# Patient Record
Sex: Female | Born: 1980 | Hispanic: Yes | Marital: Single | Smoking: Never smoker
Health system: Southern US, Community
[De-identification: ages and names within clinical notes are randomized; demographics above are authoritative.]

## PROBLEM LIST (undated history)

## (undated) ENCOUNTER — Inpatient Hospital Stay (HOSPITAL_COMMUNITY): Payer: Self-pay

## (undated) DIAGNOSIS — I10 Essential (primary) hypertension: Secondary | ICD-10-CM

## (undated) DIAGNOSIS — E669 Obesity, unspecified: Secondary | ICD-10-CM

## (undated) DIAGNOSIS — K760 Fatty (change of) liver, not elsewhere classified: Secondary | ICD-10-CM

## (undated) DIAGNOSIS — E119 Type 2 diabetes mellitus without complications: Secondary | ICD-10-CM

## (undated) DIAGNOSIS — R7989 Other specified abnormal findings of blood chemistry: Secondary | ICD-10-CM

## (undated) HISTORY — DX: Type 2 diabetes mellitus without complications: E11.9

## (undated) HISTORY — PX: THERAPEUTIC ABORTION: SHX798

## (undated) HISTORY — DX: Fatty (change of) liver, not elsewhere classified: K76.0

## (undated) HISTORY — DX: Other specified abnormal findings of blood chemistry: R79.89

## (undated) HISTORY — DX: Obesity, unspecified: E66.9

---

## 2012-05-17 ENCOUNTER — Emergency Department (HOSPITAL_COMMUNITY): Payer: Self-pay

## 2012-05-17 ENCOUNTER — Encounter (HOSPITAL_COMMUNITY): Payer: Self-pay | Admitting: *Deleted

## 2012-05-17 ENCOUNTER — Emergency Department (HOSPITAL_COMMUNITY)
Admission: EM | Admit: 2012-05-17 | Discharge: 2012-05-17 | Disposition: A | Discharge status: Home / Self Care | Payer: Self-pay | Attending: Emergency Medicine | Admitting: Emergency Medicine

## 2012-05-17 DIAGNOSIS — R110 Nausea: Secondary | ICD-10-CM | POA: Insufficient documentation

## 2012-05-17 DIAGNOSIS — R109 Unspecified abdominal pain: Secondary | ICD-10-CM | POA: Insufficient documentation

## 2012-05-17 DIAGNOSIS — Z3202 Encounter for pregnancy test, result negative: Secondary | ICD-10-CM | POA: Insufficient documentation

## 2012-05-17 DIAGNOSIS — I10 Essential (primary) hypertension: Secondary | ICD-10-CM | POA: Insufficient documentation

## 2012-05-17 HISTORY — DX: Essential (primary) hypertension: I10

## 2012-05-17 LAB — CBC WITH DIFFERENTIAL/PLATELET
Basophils Absolute: 0 10*3/uL (ref 0.0–0.1)
Basophils Relative: 0 % (ref 0–1)
Hemoglobin: 13.3 g/dL (ref 12.0–15.0)
MCHC: 33.9 g/dL (ref 30.0–36.0)
Monocytes Relative: 6 % (ref 3–12)
Neutro Abs: 4.8 10*3/uL (ref 1.7–7.7)
Neutrophils Relative %: 47 % (ref 43–77)
RDW: 12.8 % (ref 11.5–15.5)

## 2012-05-17 LAB — COMPREHENSIVE METABOLIC PANEL
ALT: 54 U/L — ABNORMAL HIGH (ref 0–35)
AST: 35 U/L (ref 0–37)
Albumin: 4.1 g/dL (ref 3.5–5.2)
Alkaline Phosphatase: 118 U/L — ABNORMAL HIGH (ref 39–117)
Chloride: 101 mEq/L (ref 96–112)
Potassium: 3.8 mEq/L (ref 3.5–5.1)
Total Bilirubin: 0.2 mg/dL — ABNORMAL LOW (ref 0.3–1.2)

## 2012-05-17 LAB — URINALYSIS, MICROSCOPIC ONLY
Glucose, UA: NEGATIVE mg/dL
Hgb urine dipstick: NEGATIVE
Ketones, ur: NEGATIVE mg/dL
Protein, ur: NEGATIVE mg/dL

## 2012-05-17 LAB — GC/CHLAMYDIA PROBE AMP
CT Probe RNA: NEGATIVE
GC Probe RNA: NEGATIVE

## 2012-05-17 LAB — WET PREP, GENITAL

## 2012-05-17 MED ORDER — NAPROXEN 500 MG PO TABS: 500.0000 mg | ORAL_TABLET | Freq: Two times a day (BID) | ORAL | Status: DC

## 2012-05-17 MED ORDER — NAPROXEN 250 MG PO TABS
500.0000 mg | ORAL_TABLET | Freq: Once | ORAL | Status: AC
  Administered 2012-05-17: 500 mg via ORAL
  Filled 2012-05-17: qty 2

## 2012-05-17 NOTE — ED Notes (Signed)
Per interpreter phone: pt c/o abdominal pain, has not has been able to eat.  Abcess on abdomen.  States she has been vomiting, nausea, diarrhea.  Denies urinary sx, urinary pain.

## 2012-05-17 NOTE — ED Notes (Signed)
Pt c/o abdominal pain x 3 days, vomiting/nausea.  Pt also states she has an abscess on her abdomen.

## 2012-05-17 NOTE — ED Notes (Signed)
Pt discharged.Vital signs stable and GCS 15 

## 2012-05-17 NOTE — ED Provider Notes (Signed)
History     CSN: [id number]  Arrival date & time 05/17/12  0126   First MD Initiated Contact with Patient 05/17/12 [phone]      Chief Complaint  Patient presents with  . Abdominal Pain    (Consider location/radiation/quality/duration/timing/severity/associated sxs/prior treatment) HPI Comments: The patient states that she has had lower abdominal pain to the right lower quadrant and the left lower quadrant for the last 4 days, it seems to be worse when she is, it is very mild at this time, nothing seems to make it better. She denies any diarrhea, constipation, blood in her stools, dysuria or vaginal discharge. She stopped her menstrual period approximately 5 days ago. She has had a prior cesarean section 8 years ago but no other abdominal surgery.    Patient is a 32 y.o. female presenting with abdominal pain. The history is provided by the patient and a friend.  Abdominal Pain The primary symptoms of the illness include abdominal pain and nausea. The primary symptoms of the illness do not include fever, fatigue, shortness of breath, vomiting, diarrhea, hematemesis, hematochezia, dysuria, vaginal discharge or vaginal bleeding. Episode onset: Four days ago. The onset of the illness was gradual. Progression since onset: It has not changed, it comes and goes, seems to be worse when she eats.    Past Medical History  Diagnosis Date  . Hypertension     Past Surgical History  Procedure Date  . Cesarean section     History reviewed. No pertinent family history.  History  Substance Use Topics  . Smoking status: Never Smoker   . Smokeless tobacco: Not on file  . Alcohol Use: Yes    OB History    Grav Para Term Preterm Abortions TAB SAB Ect Mult Living                  Review of Systems  Constitutional: Negative for fever and fatigue.  Respiratory: Negative for shortness of breath.   Gastrointestinal: Positive for nausea and abdominal pain. Negative for vomiting, diarrhea,  hematochezia and hematemesis.  Genitourinary: Negative for dysuria, vaginal bleeding and vaginal discharge.  All other systems reviewed and are negative.    Allergies  Review of patient's allergies indicates no known allergies.  Home Medications   Current Outpatient Rx  Name  Route  Sig  Dispense  Refill  . ACETAMINOPHEN 500 MG PO TABS   Oral   Take 1,000 mg by mouth every 6 (six) hours as needed. For pain         . NAPROXEN 500 MG PO TABS   Oral   Take 1 tablet (500 mg total) by mouth 2 (two) times daily with a meal.   30 tablet   0     BP 116/67  Pulse 79  Temp 97.5 F (36.4 C) (Oral)  Resp 16  SpO2 99%  LMP 05/09/2012  Physical Exam  Nursing note and vitals reviewed. Constitutional: She appears well-developed and well-nourished. No distress.  HENT:  Head: Normocephalic and atraumatic.  Mouth/Throat: Oropharynx is clear and moist. No oropharyngeal exudate.  Eyes: Conjunctivae normal and EOM are normal. Pupils are equal, round, and reactive to light. Right eye exhibits no discharge. Left eye exhibits no discharge. No scleral icterus.  Neck: Normal range of motion. Neck supple. No JVD present. No thyromegaly present.  Cardiovascular: Normal rate, regular rhythm, normal heart sounds and intact distal pulses.  Exam reveals no gallop and no friction rub.   No murmur heard. Pulmonary/Chest: Effort normal  and breath sounds normal. No respiratory distress. She has no wheezes. She has no rales.  Abdominal: Soft. Bowel sounds are normal. She exhibits no distension and no mass. There is no tenderness.       The patient states that she has minimal tenderness in the right lower quadrant and the left lower quadrant but no tenderness in her upper abdomen on palpation. There are no masses, no guarding, no peritoneal signs  Genitourinary:       Chaperone present for pelvic exam  Patient examined, speculum placed, scant vaginal discharge present, no malodor, no bleeding, cervical  os closed, no cervical motion tenderness, no adnexal tenderness or masses.  Musculoskeletal: Normal range of motion. She exhibits no edema and no tenderness.  Lymphadenopathy:    She has no cervical adenopathy.  Neurological: She is alert. Coordination normal.  Skin: Skin is warm and dry. No rash noted. No erythema.  Psychiatric: She has a normal mood and affect. Her behavior is normal.    ED Course  Procedures (including critical care time)  Labs Reviewed  CBC WITH DIFFERENTIAL - Abnormal; Notable for the following:    Lymphs Abs 4.7 (*)     All other components within normal limits  COMPREHENSIVE METABOLIC PANEL - Abnormal; Notable for the following:    Glucose, Bld 122 (*)     ALT 54 (*)     Alkaline Phosphatase 118 (*)     Total Bilirubin 0.2 (*)     All other components within normal limits  URINALYSIS, MICROSCOPIC ONLY - Abnormal; Notable for the following:    APPearance CLOUDY (*)     Bacteria, UA FEW (*)     Squamous Epithelial / LPF FEW (*)     All other components within normal limits  WET PREP, GENITAL - Abnormal; Notable for the following:    Clue Cells Wet Prep HPF POC FEW (*)     WBC, Wet Prep HPF POC FEW (*)     All other components within normal limits  POCT PREGNANCY, URINE  GC/CHLAMYDIA PROBE AMP   Dg Abd 1 View  05/17/2012  *RADIOLOGY REPORT*  Clinical Data: Lower abdominal pain.  ABDOMEN - 1 VIEW  Comparison: None.  Findings: Normal bowel gas pattern with scattered gas and stool in the colon.  No small or large bowel distension.  No radiopaque stones are identified.  Visualized bones appear intact.  There are scattered punctate calcifications are metallic structures projected over the lower pelvis of nonspecific etiology. These may be within soft tissues or clothing.  IMPRESSION: Nonobstructive bowel gas pattern.   Original Report Authenticated By: Burman Nieves, M.D.      1. Abdominal pain       MDM  The patient has no leukocytosis, normal metabolic  panel and normal liver function tests essentially, normal urinalysis without any ketonuria or infection and a negative pregnancy test. At this time I doubt that she has acute appendicitis with 4 days of symptoms of wax and wane, she will need a pelvic exam to rule out an STD, otherwise the patient has a benign exam. She has declined pain medicine at this time.  X-ray shows no signs of bowel obstruction or significant stool or gas in the bowels. No signs of bowel obstruction per the radiology report.  The patient has an unremarkable exam, unremarkable pelvic exam, wet prep is normal, laboratory data is normal. She will be given Naprosyn, [facility]  She has expressed her understanding. Her  repeat abdominal exam prior to discharge shows a soft nontender benign abdomen.    Vida Roller, MD 05/17/12 [phone]

## 2014-04-14 NOTE — L&D Delivery Note (Signed)
Delivery Note At 3:23 AM a viable female was delivered via Vaginal, Spontaneous Delivery (Presentation: ; Occiput Anterior).  APGAR: , ; weight  .   Placenta status: , .  Cord:  with the following complications: .  Cord pH: not done  Anesthesia: Epidural  Episiotomy: None Lacerations: 2nd degree Suture Repair: 2.0 vicryl Est. Blood Loss (mL):    Mom to postpartum.  Baby to Couplet care / Skin to Skin.  Arin Vanosdol A 01/31/2015, 3:36 AM

## 2014-07-12 ENCOUNTER — Inpatient Hospital Stay (HOSPITAL_COMMUNITY): Payer: Self-pay

## 2014-07-12 ENCOUNTER — Encounter (HOSPITAL_COMMUNITY): Payer: Self-pay | Admitting: *Deleted

## 2014-07-12 ENCOUNTER — Inpatient Hospital Stay (HOSPITAL_COMMUNITY)
Admission: AD | Admit: 2014-07-12 | Discharge: 2014-07-12 | Disposition: A | Discharge status: Home / Self Care | Payer: Self-pay | Source: Ambulatory Visit | Attending: Obstetrics | Admitting: Obstetrics

## 2014-07-12 DIAGNOSIS — Z8249 Family history of ischemic heart disease and other diseases of the circulatory system: Secondary | ICD-10-CM | POA: Insufficient documentation

## 2014-07-12 DIAGNOSIS — R109 Unspecified abdominal pain: Secondary | ICD-10-CM | POA: Insufficient documentation

## 2014-07-12 DIAGNOSIS — Z3A10 10 weeks gestation of pregnancy: Secondary | ICD-10-CM | POA: Insufficient documentation

## 2014-07-12 DIAGNOSIS — O209 Hemorrhage in early pregnancy, unspecified: Secondary | ICD-10-CM

## 2014-07-12 DIAGNOSIS — O26899 Other specified pregnancy related conditions, unspecified trimester: Secondary | ICD-10-CM

## 2014-07-12 DIAGNOSIS — O4691 Antepartum hemorrhage, unspecified, first trimester: Secondary | ICD-10-CM | POA: Insufficient documentation

## 2014-07-12 DIAGNOSIS — Z833 Family history of diabetes mellitus: Secondary | ICD-10-CM | POA: Insufficient documentation

## 2014-07-12 DIAGNOSIS — O200 Threatened abortion: Secondary | ICD-10-CM

## 2014-07-12 DIAGNOSIS — O9989 Other specified diseases and conditions complicating pregnancy, childbirth and the puerperium: Secondary | ICD-10-CM | POA: Insufficient documentation

## 2014-07-12 LAB — URINALYSIS, ROUTINE W REFLEX MICROSCOPIC
Bilirubin Urine: NEGATIVE
GLUCOSE, UA: NEGATIVE mg/dL
KETONES UR: NEGATIVE mg/dL
Nitrite: NEGATIVE
PROTEIN: 100 mg/dL — AB
SPECIFIC GRAVITY, URINE: 1.02 (ref 1.005–1.030)
UROBILINOGEN UA: 0.2 mg/dL (ref 0.0–1.0)
pH: 7 (ref 5.0–8.0)

## 2014-07-12 LAB — CBC WITH DIFFERENTIAL/PLATELET
BASOS PCT: 0 % (ref 0–1)
Basophils Absolute: 0 10*3/uL (ref 0.0–0.1)
EOS ABS: 0.2 10*3/uL (ref 0.0–0.7)
EOS PCT: 2 % (ref 0–5)
HEMATOCRIT: 35.5 % — AB (ref 36.0–46.0)
HEMOGLOBIN: 12.1 g/dL (ref 12.0–15.0)
LYMPHS ABS: 2.3 10*3/uL (ref 0.7–4.0)
Lymphocytes Relative: 20 % (ref 12–46)
MCH: 31 pg (ref 26.0–34.0)
MCHC: 34.1 g/dL (ref 30.0–36.0)
MCV: 91 fL (ref 78.0–100.0)
MONO ABS: 0.6 10*3/uL (ref 0.1–1.0)
MONOS PCT: 5 % (ref 3–12)
NEUTROS ABS: 8.6 10*3/uL — AB (ref 1.7–7.7)
Neutrophils Relative %: 74 % (ref 43–77)
PLATELETS: 303 10*3/uL (ref 150–400)
RBC: 3.9 MIL/uL (ref 3.87–5.11)
RDW: 13.3 % (ref 11.5–15.5)
WBC: 11.7 10*3/uL — AB (ref 4.0–10.5)

## 2014-07-12 LAB — POCT PREGNANCY, URINE: PREG TEST UR: POSITIVE — AB

## 2014-07-12 LAB — URINE MICROSCOPIC-ADD ON

## 2014-07-12 LAB — HCG, QUANTITATIVE, PREGNANCY: HCG, BETA CHAIN, QUANT, S: 134837 m[IU]/mL — AB (ref <5)

## 2014-07-12 LAB — ABO/RH: ABO/RH(D): O POS

## 2014-07-12 MED ORDER — ACETAMINOPHEN 500 MG PO TABS: 1000.0000 mg | ORAL_TABLET | ORAL | Status: DC

## 2014-07-12 NOTE — MAU Note (Signed)
appt with Dr Gaynell FaceMarshall tomorrow.  Pain started yesterday, bleeding started this morning at 0400

## 2014-07-12 NOTE — Discharge Instructions (Signed)
Reposo pélvico  °(Pelvic Rest) °El reposo pélvico se recomienda a las mujeres cuando:  °· La placenta cubre parcial o completamente la abertura del cuello del útero (placenta previa). °· Hay sangrado entre la pared del útero y el saco amniótico en el primer trimestre (hemorragia subcoriónica). °· El cuello uterino comienza a abrirse sin iniciarse el trabajo de parto (cuello uterino incompetente, insuficiencia cervical). °· El trabajo de parto se inicia muy pronto (parto prematuro). °INSTRUCCIONES PARA EL CUIDADO EN EL HOGAR  °· No tenga relaciones sexuales, estimulación, ni orgasmos. °· No use tampones, no se haga duchas vaginales ni coloque ningún objeto en la vagina. °· No levante objetos que pesen más de 10 libras (4,5 kg). °· Evite las actividades extenuantes o tensionar los músculos de la pelvis. °SOLICITE ATENCIÓN MÉDICA SI:   °· Tiene un sangrado vaginal durante el embarazo. Considérelo como una posible emergencia. °· Siente cólicos en la zona baja del estómago (más fuertes que los cólicos menstruales). °· Nota flujo vaginal (acuoso, con moco o sangre). °· Siente un dolor en la espalda leve y sordo. °· Tiene contracciones regulares o endurecimiento del útero. °SOLICITE ATENCIÓN MÉDICA DE INMEDIATO SI:  °Observa sangrado vaginal y tiene placenta previa.  °Document Released: 12/24/2011 °ExitCare® Patient Information ©2015 [employer]. This information is not intended to replace advice given to you by your health care provider. Make sure you discuss any questions you have with your health care provider. ° °Hemorragia vaginal durante el embarazo (primer trimestre) °(Vaginal Bleeding During Pregnancy, First Trimester) °Durante los primeros meses del embarazo es relativamente frecuente que se presente una pequeña hemorragia (manchas). Esta situación generalmente mejora por sí misma. Estas hemorragias o manchas tienen diversas causas al inicio del embarazo. Algunas manchas pueden estar relacionadas al embarazo y  otras no. En la mayoría de los casos, la hemorragia es normal y no es un problema. Sin embargo, la hemorragia también puede ser un signo de algo grave. Debe informar a su médico de inmediato si tiene alguna hemorragia vaginal. °Algunas causas posibles de hemorragia vaginal durante el primer trimestre incluyen: °· Infección o inflamación del cuello del útero. °· Crecimientos anormales (pólipos) en el cuello del útero. °· Aborto espontáneo o amenaza de aborto espontáneo. °· Tejido del embarazo se ha desarrollado fuera del útero y en una trompa de falopio (embarazo ectópico). °· Se han desarrollado pequeños quistes en el útero en lugar de tejido de embarazo (embarazo molar). °INSTRUCCIONES PARA EL CUIDADO EN EL HOGAR  °Controle su afección para ver si hay cambios. Las siguientes indicaciones ayudarán a aliviar cualquier molestia que pueda sentir: °· Siga las indicaciones del médico para restringir su actividad. Si el médico le indica descanso en la cama, debe quedarse en la cama y levantarse solo para ir al baño. No obstante, el médico puede permitirle que continué con tareas livianas. °· Si es necesario, organícese para que alguien le ayude con las actividades y responsabilidades cotidianas mientras está en cama. °· Lleve un registro de la cantidad y la saturación de las toallas higiénicas que utiliza cada día. Anote este dato. °· No use tampones.No se haga duchas vaginales. °· No tenga relaciones sexuales u orgasmos hasta que el médico la autorice. °· Si elimina tejido por la vagina, guárdelo para mostrárselo al médico. °· Tome solo medicamentos de venta libre o recetados, según las indicaciones del médico. °· No tome aspirina, ya que puede causar hemorragias. °· Cumpla con todas las visitas de control, según le indique su médico. °SOLICITE ATENCIÓN MÉDICA SI: °· Tiene   un sangrado vaginal en cualquier momento del embarazo. °· Tiene calambres o dolores de parto. °· Tiene fiebre que los medicamentos no pueden  controlar. °SOLICITE ATENCIÓN MÉDICA DE INMEDIATO SI:  °· Siente calambres intensos en la espalda o en el vientre (abdomen). °· Elimina coágulos grandes o tejido por la vagina. °· La hemorragia aumenta. °· Si se siente mareada, débil o se desmaya. °· Tiene escalofríos. °· Tiene una pérdida importante o sale líquido a borbotones por la vagina. °· Se desmaya mientras defeca. °ASEGÚRESE DE QUE: °· Comprende estas instrucciones. °· Controlará su afección. °· Recibirá ayuda de inmediato si no mejora o si empeora. °Document Released: 01/08/2005 Document Revised: 04/05/2013 °ExitCare® Patient Information ©2015 [employer]. This information is not intended to replace advice given to you by your health care provider. Make sure you discuss any questions you have with your health care provider. ° °

## 2014-07-12 NOTE — MAU Provider Note (Signed)
History     CSN: [id number]  Arrival date and time: 07/12/14 1141   First Provider Initiated Contact with Patient 07/12/14 1430      Chief Complaint  Patient presents with  . Vaginal Bleeding  . Abdominal Pain   HPI  Tara [name] 34 y.o. G2P0100 @[redacted]w[redacted]d  presents to MAU complaining of pain and bleeding.  The pain started yesterday in her lower abdomen and with urination.  She awoke at 4am today with bleeding that is like a period with bright red blood and clots.  The pain is 5/10 and located centrally in lower abdomen and L>R.  She had a prior pregnancy with no complications except for high blood pressure for which she takes an unknown medication.  NO other treatments used.  Did have vomiting once this am and complains of lightheadedness.  She denies nausea, constipation, diarrhea, fever, headache.  Has appt with Dr. Gaynell FaceMarshall tomorrow.     OB History    Gravida Para Term Preterm AB TAB SAB Ectopic Multiple Living   2 1  1             Past Medical History  Diagnosis Date  . Hypertension     Past Surgical History  Procedure Laterality Date  . Cesarean section      Family History  Problem Relation Age of Onset  . Diabetes Mother   . Hypertension Mother     History  Substance Use Topics  . Smoking status: Never Smoker   . Smokeless tobacco: Not on file  . Alcohol Use: Yes    Allergies: No Known Allergies  Prescriptions prior to admission  Medication Sig Dispense Refill Last Dose  . PRESCRIPTION MEDICATION Take 1 tablet by mouth daily. Patient states she takes something for high blood pressure but does not know name- called pharmacy but they put me on hold  forever     . naproxen (NAPROSYN) 500 MG tablet Take 1 tablet (500 mg total) by mouth 2 (two) times daily with a meal. (Patient not taking: Reported on 07/12/2014) 30 tablet 0     ROS Pertinent ROS in HPI  Physical Exam   Blood pressure 128/78, pulse 82, temperature 98.6 F (37 C), temperature source  Oral, resp. rate 18, height 5' 1.5" (1.562 m), weight 162 lb (73.483 kg), last menstrual period 05/06/2014.  Physical Exam  Constitutional: She is oriented to person, place, and time. She appears well-developed and well-nourished. No distress.  HENT:  Head: Normocephalic and atraumatic.  Eyes: EOM are normal.  Neck: Normal range of motion.  Cardiovascular: Normal rate, regular rhythm and normal heart sounds.   Respiratory: Effort normal and breath sounds normal. No respiratory distress.  GI: Soft. Bowel sounds are normal. She exhibits no distension. There is no tenderness. There is no rebound and no guarding.  Genitourinary:  Mild to mod amt of bright red blood in vaginal vault, noted coming from cervix.   No CMT.  No adnexal mass or tenderness noted on exam.    Musculoskeletal: Normal range of motion.  Neurological: She is alert and oriented to person, place, and time. No cranial nerve deficit.  Skin: Skin is warm and dry.  Psychiatric: She has a normal mood and affect.   Results for orders placed or performed during the hospital encounter of 07/12/14 (from the past 24 hour(s))  Urinalysis, Routine w reflex microscopic     Status: Abnormal   Collection Time: 07/12/14 12:00 PM  Result Value Ref Range   Color, Urine AMBER (  A) YELLOW   APPearance CLOUDY (A) CLEAR   Specific Gravity, Urine 1.020 1.005 - 1.030   pH 7.0 5.0 - 8.0   Glucose, UA NEGATIVE NEGATIVE mg/dL   Hgb urine dipstick LARGE (A) NEGATIVE   Bilirubin Urine NEGATIVE NEGATIVE   Ketones, ur NEGATIVE NEGATIVE mg/dL   Protein, ur 469 (A) NEGATIVE mg/dL   Urobilinogen, UA 0.2 0.0 - 1.0 mg/dL   Nitrite NEGATIVE NEGATIVE   Leukocytes, UA MODERATE (A) NEGATIVE  Urine microscopic-add on     Status: Abnormal   Collection Time: 07/12/14 12:00 PM  Result Value Ref Range   Squamous Epithelial / LPF FEW (A) RARE   WBC, UA 21-50 <3 WBC/hpf   RBC / HPF 21-50 <3 RBC/hpf  Pregnancy, urine POC     Status: Abnormal   Collection  Time: 07/12/14 12:14 PM  Result Value Ref Range   Preg Test, Ur POSITIVE (A) NEGATIVE  CBC with Differential/Platelet     Status: Abnormal   Collection Time: 07/12/14  1:10 PM  Result Value Ref Range   WBC 11.7 (H) 4.0 - 10.5 K/uL   RBC 3.90 3.87 - 5.11 MIL/uL   Hemoglobin 12.1 12.0 - 15.0 g/dL   HCT 62.9 (L) 52.8 - 41.3 %   MCV 91.0 78.0 - 100.0 fL   MCH 31.0 26.0 - 34.0 pg   MCHC 34.1 30.0 - 36.0 g/dL   RDW 24.4 01.0 - 27.2 %   Platelets 303 150 - 400 K/uL   Neutrophils Relative % 74 43 - 77 %   Neutro Abs 8.6 (H) 1.7 - 7.7 K/uL   Lymphocytes Relative 20 12 - 46 %   Lymphs Abs 2.3 0.7 - 4.0 K/uL   Monocytes Relative 5 3 - 12 %   Monocytes Absolute 0.6 0.1 - 1.0 K/uL   Eosinophils Relative 2 0 - 5 %   Eosinophils Absolute 0.2 0.0 - 0.7 K/uL   Basophils Relative 0 0 - 1 %   Basophils Absolute 0.0 0.0 - 0.1 K/uL  hCG, quantitative, pregnancy     Status: Abnormal   Collection Time: 07/12/14  1:10 PM  Result Value Ref Range   hCG, Beta Chain, Quant, S 536644 (H) <5 mIU/mL  ABO/Rh     Status: None (Preliminary result)   Collection Time: 07/12/14  1:10 PM  Result Value Ref Range   ABO/RH(D) O POS    US Ob Comp Less 14 Wks  07/12/2014   CLINICAL DATA:  Left lower quadrant pain since yesterday. Vaginal bleeding.  EXAM: OBSTETRIC <14 WK ULTRASOUND  TECHNIQUE: Transabdominal ultrasound was performed for evaluation of the gestation as well as the maternal uterus and adnexal regions.  COMPARISON:  None.  FINDINGS: Intrauterine gestational sac: Visualized/normal in shape.  Yolk sac:  Present  Embryo:  Present  Cardiac Activity: Present  Heart Rate: 172 bpm  CRL:   3.53 cm  10 w 4d                  [place] EDC: 02/03/2015  Maternal uterus/adnexae: No subchorionic hemorrhage. Nonvisualized right ovary. Normal left ovary measuring 3.5 x 1.8 x 3 cm. No adnexal mass. No pelvic free fluid.  IMPRESSION: Single live intrauterine pregnancy dating 10 week 4 day.   Electronically Signed   By: Elige Ko    On: 07/12/2014 15:21    MAU Course  Procedures  MDM Pain and bleeding in pregnancy.  Viable intrauterine pregnancy seen on u/s.  No subchorionic noted.  Rh positive  status.  No sign of infection.  CBC stable.    Assessment and Plan  A: Threatened miscarriage Vaginal bleeding in pregnancy Abdominal pain in pregnancy  P: Discharge to home Pelvic rest F/u in clinic for [facility] asap as scheduled tomorrow PNV qd Patient may return to MAU as needed or if her condition were to change or worsen   Bertram Denver 07/12/2014, 2:43 PM

## 2014-07-13 LAB — RPR: RPR Ser Ql: NONREACTIVE

## 2014-07-13 LAB — HIV ANTIBODY (ROUTINE TESTING W REFLEX): HIV Screen 4th Generation wRfx: NONREACTIVE

## 2014-07-17 ENCOUNTER — Inpatient Hospital Stay (HOSPITAL_COMMUNITY)
Admission: AD | Admit: 2014-07-17 | Discharge: 2014-07-17 | Disposition: A | Discharge status: Home / Self Care | Payer: Self-pay | Source: Ambulatory Visit | Attending: Obstetrics & Gynecology | Admitting: Obstetrics & Gynecology

## 2014-07-17 ENCOUNTER — Encounter (HOSPITAL_COMMUNITY): Payer: Self-pay | Admitting: *Deleted

## 2014-07-17 DIAGNOSIS — O26891 Other specified pregnancy related conditions, first trimester: Secondary | ICD-10-CM

## 2014-07-17 DIAGNOSIS — R109 Unspecified abdominal pain: Secondary | ICD-10-CM | POA: Insufficient documentation

## 2014-07-17 DIAGNOSIS — O2341 Unspecified infection of urinary tract in pregnancy, first trimester: Secondary | ICD-10-CM | POA: Insufficient documentation

## 2014-07-17 DIAGNOSIS — R51 Headache: Secondary | ICD-10-CM | POA: Insufficient documentation

## 2014-07-17 DIAGNOSIS — Z3A10 10 weeks gestation of pregnancy: Secondary | ICD-10-CM | POA: Insufficient documentation

## 2014-07-17 LAB — URINALYSIS, ROUTINE W REFLEX MICROSCOPIC
Bilirubin Urine: NEGATIVE
Glucose, UA: NEGATIVE mg/dL
Ketones, ur: NEGATIVE mg/dL
Nitrite: POSITIVE — AB
PH: 6 (ref 5.0–8.0)
Protein, ur: 30 mg/dL — AB
Specific Gravity, Urine: 1.025 (ref 1.005–1.030)
UROBILINOGEN UA: 0.2 mg/dL (ref 0.0–1.0)

## 2014-07-17 LAB — CBC WITH DIFFERENTIAL/PLATELET
Basophils Absolute: 0 10*3/uL (ref 0.0–0.1)
Basophils Relative: 0 % (ref 0–1)
Eosinophils Absolute: 0.1 10*3/uL (ref 0.0–0.7)
Eosinophils Relative: 1 % (ref 0–5)
HEMATOCRIT: 34.9 % — AB (ref 36.0–46.0)
HEMOGLOBIN: 11.9 g/dL — AB (ref 12.0–15.0)
LYMPHS PCT: 11 % — AB (ref 12–46)
Lymphs Abs: 1.2 10*3/uL (ref 0.7–4.0)
MCH: 31 pg (ref 26.0–34.0)
MCHC: 34.1 g/dL (ref 30.0–36.0)
MCV: 90.9 fL (ref 78.0–100.0)
MONO ABS: 0.6 10*3/uL (ref 0.1–1.0)
Monocytes Relative: 6 % (ref 3–12)
NEUTROS ABS: 9.2 10*3/uL — AB (ref 1.7–7.7)
NEUTROS PCT: 83 % — AB (ref 43–77)
Platelets: 301 10*3/uL (ref 150–400)
RBC: 3.84 MIL/uL — ABNORMAL LOW (ref 3.87–5.11)
RDW: 13.3 % (ref 11.5–15.5)
WBC: 11.2 10*3/uL — AB (ref 4.0–10.5)

## 2014-07-17 LAB — URINE MICROSCOPIC-ADD ON

## 2014-07-17 MED ORDER — ACETAMINOPHEN 325 MG PO TABS
650.0000 mg | ORAL_TABLET | Freq: Once | ORAL | Status: AC
  Administered 2014-07-17: 650 mg via ORAL
  Filled 2014-07-17: qty 2

## 2014-07-17 MED ORDER — CEPHALEXIN 500 MG PO CAPS: 500.0000 mg | ORAL_CAPSULE | Freq: Four times a day (QID) | ORAL | Status: DC

## 2014-07-17 NOTE — MAU Note (Signed)
Urine in lab 

## 2014-07-17 NOTE — MAU Note (Addendum)
Pt has a terrible headache, blurred vision, lighthead. Took blood pressure medicine last night for HA.

## 2014-07-17 NOTE — MAU Provider Note (Signed)
History     CSN: [id number]  Arrival date and time: 07/17/14 1421   First Provider Initiated Contact with Patient 07/17/14 1507      No chief complaint on file.  HPI Tara [name] is 34 y.o. G2P0100 [redacted]w[redacted]d weeks presenting with lower abdominal pain with she urinates and bilateral side pain constantly. Patient of Dr. Elsie Stain, seen last Wedneday.  Dr Gaynell Face told her there was dark brown discharge but no active bleeding on that visit.  Had chills and headache ache last night.  Denies exposure to illness. Rates worse pain as  9/10 sides and 5/10 when she urinates and at present her headache is causing pain rating it as 9 /10.  She took a hypertensive med last night that she had thinking her blood pressure was high causing headache.  She has not tried Tylenol for her sxs.  Hx of elevated BP X 10 years.  Saw  new doctor she saw last week instructed her to d/c med because the bottle had no information on it.   She was seen here 3/30 with bleeding and dysuria.  Bleeding has stopped.  Had viable [place] on that date [redacted]w[redacted]d.   Interpreter in room for translation.   Past Medical History  Diagnosis Date  . Hypertension     Past Surgical History  Procedure Laterality Date  . Cesarean section      Family History  Problem Relation Age of Onset  . Diabetes Mother   . Hypertension Mother     History  Substance Use Topics  . Smoking status: Never Smoker   . Smokeless tobacco: Not on file  . Alcohol Use: No    Allergies: No Known Allergies  Prescriptions prior to admission  Medication Sig Dispense Refill Last Dose  . methyldopa (ALDOMET) 250 MG tablet Take 250 mg by mouth 2 (two) times daily.   07/16/2014 at Unknown time    Review of Systems  Constitutional: Positive for fever and chills.  Respiratory: Negative for cough and shortness of breath.   Cardiovascular: Negative for chest pain.  Gastrointestinal: Positive for abdominal pain (Suprapubic pain and bilateral side pain.).  Negative for nausea and vomiting.  Genitourinary: Positive for dysuria, urgency and frequency.       Brown discharge  Neurological: Positive for headaches.   Physical Exam   Blood pressure 123/68, pulse 97, temperature 98.1 F (36.7 C), resp. rate 20, last menstrual period 05/06/2014.  Physical Exam  Constitutional: She is oriented to person, place, and time. She appears well-developed and well-nourished. No distress.  HENT:  Head: Normocephalic.  Neck: Normal range of motion.  Cardiovascular: Normal rate.   Respiratory: Effort normal.  GI: Soft. She exhibits no distension and no mass. There is tenderness in the suprapubic area. There is no rebound, no guarding and no CVA tenderness.  Genitourinary: There is no tenderness or lesion on the right labia. There is no tenderness or lesion on the left labia. Uterus is enlarged. Uterus is not tender. Cervix exhibits no motion tenderness and no friability. Right adnexum displays no mass, no tenderness and no fullness. Left adnexum displays no mass, no tenderness and no fullness. No erythema, tenderness or bleeding in the vagina. Vaginal discharge (small amount of dark brown discharge without active bleeding) found.  Musculoskeletal:  Lower back tenderness from waist down.  Neg for specific CVA tenderness.   Neurological: She is alert and oriented to person, place, and time. No cranial nerve deficit or sensory deficit. Coordination and gait normal.  Skin:  Skin is warm and dry.  Psychiatric: She has a normal mood and affect. Her behavior is normal.   Results for orders placed or performed during the hospital encounter of 07/17/14 (from the past 24 hour(s))  Urinalysis, Routine w reflex microscopic     Status: Abnormal   Collection Time: 07/17/14  2:36 PM  Result Value Ref Range   Color, Urine YELLOW YELLOW   APPearance CLOUDY (A) CLEAR   Specific Gravity, Urine 1.025 1.005 - 1.030   pH 6.0 5.0 - 8.0   Glucose, UA NEGATIVE NEGATIVE mg/dL   Hgb  urine dipstick SMALL (A) NEGATIVE   Bilirubin Urine NEGATIVE NEGATIVE   Ketones, ur NEGATIVE NEGATIVE mg/dL   Protein, ur 30 (A) NEGATIVE mg/dL   Urobilinogen, UA 0.2 0.0 - 1.0 mg/dL   Nitrite POSITIVE (A) NEGATIVE   Leukocytes, UA LARGE (A) NEGATIVE  Urine microscopic-add on     Status: Abnormal   Collection Time: 07/17/14  2:36 PM  Result Value Ref Range   Squamous Epithelial / LPF FEW (A) RARE   WBC, UA TOO NUMEROUS TO COUNT <3 WBC/hpf   RBC / HPF 11-20 <3 RBC/hpf   Bacteria, UA FEW (A) RARE  CBC with Differential/Platelet     Status: Abnormal   Collection Time: 07/17/14  3:35 PM  Result Value Ref Range   WBC 11.2 (H) 4.0 - 10.5 K/uL   RBC 3.84 (L) 3.87 - 5.11 MIL/uL   Hemoglobin 11.9 (L) 12.0 - 15.0 g/dL   HCT 78.234.9 (L) 95.636.0 - 21.346.0 %   MCV 90.9 78.0 - 100.0 fL   MCH 31.0 26.0 - 34.0 pg   MCHC 34.1 30.0 - 36.0 g/dL   RDW 08.613.3 57.811.5 - 46.915.5 %   Platelets 301 150 - 400 K/uL   Neutrophils Relative % 83 (H) 43 - 77 %   Neutro Abs 9.2 (H) 1.7 - 7.7 K/uL   Lymphocytes Relative 11 (L) 12 - 46 %   Lymphs Abs 1.2 0.7 - 4.0 K/uL   Monocytes Relative 6 3 - 12 %   Monocytes Absolute 0.6 0.1 - 1.0 K/uL   Eosinophils Relative 1 0 - 5 %   Eosinophils Absolute 0.1 0.0 - 0.7 K/uL   Basophils Relative 0 0 - 1 %   Basophils Absolute 0.0 0.0 - 0.1 K/uL     MAU Course  Procedures  Acetaminophen 650 mg po given in MAU                      Urine culture to lab  MDM At time of discharge patient states pain is now 4/10.  Sxs lessened with Tylenol.   Assessment and Plan  A:  UTI in first trimester pregnancy==10w 2d by dates       Headache      Afebrile  P:  Rx Keflex 500mg  po qid X 7 days-stressed importance of taking all med       May take tylenol prn for discomfort       Increase PO fluids      Stressed importance of keeping scheduled prenatal appointments with Dr. Gaynell FaceMarshall to evaluate blood pressure      Call Dr. Gaynell FaceMarshall for worsening sxs       Zafiro Routson,EVE M 07/17/2014, 3:54 PM

## 2014-07-17 NOTE — Discharge Instructions (Signed)

## 2014-07-17 NOTE — MAU Note (Signed)
Pt complains of mid abd pain on bilateral sides. Denies vaginal bleeding or discharge. Has pain with urination.

## 2014-07-17 NOTE — Progress Notes (Signed)
I assisted Doctor, hospitalhannon RN and Jeani SowEve Key NP with some questions, by Orlan LeavensViria Alvarez

## 2014-07-20 LAB — CULTURE, OB URINE
Colony Count: >=100000
Special Requests: NORMAL

## 2014-08-07 LAB — OB RESULTS CONSOLE ABO/RH: RH TYPE: POSITIVE

## 2014-08-07 LAB — OB RESULTS CONSOLE HEPATITIS B SURFACE ANTIGEN: Hepatitis B Surface Ag: NEGATIVE

## 2014-08-07 LAB — OB RESULTS CONSOLE RUBELLA ANTIBODY, IGM: Rubella: IMMUNE

## 2014-08-07 LAB — OB RESULTS CONSOLE ANTIBODY SCREEN: ANTIBODY SCREEN: NEGATIVE

## 2014-08-07 LAB — OB RESULTS CONSOLE GC/CHLAMYDIA
CHLAMYDIA, DNA PROBE: NEGATIVE
Gonorrhea: NEGATIVE

## 2014-08-07 LAB — OB RESULTS CONSOLE RPR: RPR: NONREACTIVE

## 2014-08-07 LAB — OB RESULTS CONSOLE HIV ANTIBODY (ROUTINE TESTING): HIV: NONREACTIVE

## 2014-10-30 ENCOUNTER — Encounter (HOSPITAL_COMMUNITY): Payer: Self-pay | Admitting: *Deleted

## 2014-10-30 ENCOUNTER — Inpatient Hospital Stay (HOSPITAL_COMMUNITY)
Admission: AD | Admit: 2014-10-30 | Discharge: 2014-10-30 | Disposition: A | Discharge status: Home / Self Care | Payer: Self-pay | Source: Ambulatory Visit | Attending: Obstetrics | Admitting: Obstetrics

## 2014-10-30 DIAGNOSIS — R109 Unspecified abdominal pain: Secondary | ICD-10-CM | POA: Insufficient documentation

## 2014-10-30 DIAGNOSIS — R197 Diarrhea, unspecified: Secondary | ICD-10-CM | POA: Insufficient documentation

## 2014-10-30 DIAGNOSIS — K529 Noninfective gastroenteritis and colitis, unspecified: Secondary | ICD-10-CM | POA: Insufficient documentation

## 2014-10-30 DIAGNOSIS — O26899 Other specified pregnancy related conditions, unspecified trimester: Secondary | ICD-10-CM

## 2014-10-30 DIAGNOSIS — O9989 Other specified diseases and conditions complicating pregnancy, childbirth and the puerperium: Secondary | ICD-10-CM

## 2014-10-30 DIAGNOSIS — Z3A25 25 weeks gestation of pregnancy: Secondary | ICD-10-CM | POA: Insufficient documentation

## 2014-10-30 DIAGNOSIS — O99612 Diseases of the digestive system complicating pregnancy, second trimester: Secondary | ICD-10-CM | POA: Insufficient documentation

## 2014-10-30 LAB — URINALYSIS, ROUTINE W REFLEX MICROSCOPIC
Bilirubin Urine: NEGATIVE
Glucose, UA: NEGATIVE mg/dL
Ketones, ur: NEGATIVE mg/dL
LEUKOCYTES UA: NEGATIVE
Nitrite: NEGATIVE
PH: 6 (ref 5.0–8.0)
Protein, ur: NEGATIVE mg/dL
Specific Gravity, Urine: 1.02 (ref 1.005–1.030)
Urobilinogen, UA: 0.2 mg/dL (ref 0.0–1.0)

## 2014-10-30 LAB — URINE MICROSCOPIC-ADD ON

## 2014-10-30 MED ORDER — LOPERAMIDE HCL 2 MG PO CAPS
4.0000 mg | ORAL_CAPSULE | Freq: Once | ORAL | Status: AC
  Administered 2014-10-30: 4 mg via ORAL
  Filled 2014-10-30: qty 2

## 2014-10-30 MED ORDER — LOPERAMIDE HCL 2 MG PO CAPS: 2.0000 mg | ORAL_CAPSULE | Freq: Four times a day (QID) | ORAL | Status: DC | PRN

## 2014-10-30 NOTE — MAU Provider Note (Signed)
Chief Complaint:  Abdominal Pain and Diarrhea   First Provider Initiated Contact with Patient 10/30/14 1403      HPI: Tara [name] is a 34 y.o. G3P1011 at 2855w2d who presents to maternity admissions reporting intermittent abdominal cramping and diarrhea x 3 days.  She reports loose stool/diarrhea x 3-5 episodes daily.  She believes her pain is related to something she ate.  She denies illness of any family members.  She denies nausea/vomiting.   She reports good fetal movement, denies LOF, vaginal bleeding, vaginal itching/burning, urinary symptoms, h/a, dizziness, or fever/chills.    All communication with International PaperEda Royal, hospital spanish interpreter.  Abdominal Pain This is a new problem. The current episode started in the past 7 days. The onset quality is sudden. The problem occurs intermittently. The most recent episode lasted 3 days. The problem has been waxing and waning. The pain is located in the generalized abdominal region. The pain is at a severity of 7/10. The pain is moderate. The quality of the pain is cramping. The abdominal pain radiates to the back. Associated symptoms include diarrhea. Pertinent negatives include no belching, constipation, dysuria, fever, flatus, frequency, headaches, nausea or vomiting. The pain is aggravated by bowel movement. She has tried nothing for the symptoms.  Diarrhea  Associated symptoms include abdominal pain. Pertinent negatives include no chills, coughing, fever, headaches, increased  flatus or vomiting.    Past Medical History: Past Medical History  Diagnosis Date  . Hypertension     Past obstetric history: OB History  Gravida Para Term Preterm AB SAB TAB Ectopic Multiple Living  3 1 1  0 1  1   1     # Outcome Date GA Lbr Len/2nd Weight Sex Delivery Anes PTL Lv  3 Current           2 TAB           1 Term               Past Surgical History: Past Surgical History  Procedure Laterality Date  . Cesarean section      Family  History: Family History  Problem Relation Age of Onset  . Diabetes Mother   . Hypertension Mother     Social History: History  Substance Use Topics  . Smoking status: Never Smoker   . Smokeless tobacco: Not on file  . Alcohol Use: No    Allergies: No Known Allergies  Meds:  Prescriptions prior to admission  Medication Sig Dispense Refill Last Dose  . Prenatal Vit-Fe Fumarate-FA (PRENATAL MULTIVITAMIN) TABS tablet Take 1 tablet by mouth daily at 12 noon.   10/29/2014 at Unknown time    Review of Systems  Constitutional: Negative for fever, chills and malaise/fatigue.  Eyes: Negative for blurred vision.  Respiratory: Negative for cough and shortness of breath.   Cardiovascular: Negative for chest pain.  Gastrointestinal: Positive for abdominal pain and diarrhea. Negative for heartburn, nausea, vomiting, constipation and flatus.  Genitourinary: Negative for dysuria, urgency and frequency.  Musculoskeletal: Negative.   Neurological: Negative for dizziness and headaches.  Psychiatric/Behavioral: Negative for depression.     Physical Exam  Blood pressure 127/74, pulse 81, temperature 98 F (36.7 C), temperature source Oral, resp. rate 18, weight 85.911 kg (189 lb 6.4 oz), last menstrual period 05/06/2014. GENERAL: Well-developed, well-nourished female in no acute distress.  EYES: normal sclera/conjunctiva; no lid-lag HENT: Atraumatic, normocephalic HEART: normal rate RESP: normal effort ABDOMEN: Soft, non-tender, no rebound tenderness, no guarding MUSCULOSKELETAL: Normal ROM EXTREMITIES: Nontender, no edema  NEURO/PSYCH: Alert and oriented, appropriate affect  Genitourinary: Pelvic exam: Cervix 0/thick/high, posterior     FHT:  Baseline 135 , moderate variability, accelerations present, no decelerations Contractions: None on toco or to palpation   Labs: Results for orders placed or performed during the hospital encounter of 10/30/14 (from the past 24 hour(s))   Urinalysis, Routine w reflex microscopic (not at [facility])     Status: Abnormal   Collection Time: 10/30/14  1:22 PM  Result Value Ref Range   Color, Urine YELLOW YELLOW   APPearance CLEAR CLEAR   Specific Gravity, Urine 1.020 1.005 - 1.030   pH 6.0 5.0 - 8.0   Glucose, UA NEGATIVE NEGATIVE mg/dL   Hgb urine dipstick TRACE (A) NEGATIVE   Bilirubin Urine NEGATIVE NEGATIVE   Ketones, ur NEGATIVE NEGATIVE mg/dL   Protein, ur NEGATIVE NEGATIVE mg/dL   Urobilinogen, UA 0.2 0.0 - 1.0 mg/dL   Nitrite NEGATIVE NEGATIVE   Leukocytes, UA NEGATIVE NEGATIVE  Urine microscopic-add on     Status: Abnormal   Collection Time: 10/30/14  1:22 PM  Result Value Ref Range   Squamous Epithelial / LPF FEW (A) RARE   WBC, UA 0-2 <3 WBC/hpf   RBC / HPF 0-2 <3 RBC/hpf   ED Course Pt given imodium 4 mg PO dose in MAU with no bowel movement during MAU visit.  Pt given PO fluids in MAU and she tolerated them well.  Assessment: 1. Gastroenteritis/enteritis   2. Abdominal pain affecting pregnancy     Plan: Discharge home Reviewed lab results with pt PTL precautions given Rx for Imodium PRN       Follow-up Information    Follow up with Kathreen Cosier, MD.   Specialty:  Obstetrics and Gynecology   Why:  As scheduled   Contact information:   [address] [place] [address] [phone]       Follow up with [facility] [place] MATERN[facility] ADMISSIONS.   Why:  As needed for emergencies   Contact information:   [address] [id number] mc [place] [address] [phone]       Medication List    TAKE these medications        loperamide 2 MG capsule  Commonly known as:  IMODIUM  Take 1-2 capsules (2-4 mg total) by mouth 4 (four) times daily as needed for diarrhea or loose stools.     prenatal multivitamin Tabs tablet  Take 1 tablet by mouth daily at 12 noon.        Sharen Counter Certified Nurse-Midwife 10/30/2014 3:33 PM

## 2014-10-30 NOTE — Discharge Instructions (Signed)
Diarrea  (Diarrhea) La diarrea consiste en evacuaciones intestinales frecuentes, blandas o acuosas. Puede hacerlo sentir dbil y deshidratado. La deshidratacin puede hacer que se sienta cansado, sediento, tener la boca seca y que haya disminucin de [place], que a menudo es de color amarillo oscuro. La diarrea es un signo de otro problema, generalmente una infeccin que no durar [employer]. En la [employer], la diarrea dura tpicamente 2 a 3 das. Sin embargo, puede durar ms tiempo si se trata de un signo de algo ms serio. Es importante tratar la diarrea como lo indique su mdico para disminuir o prevenir futuros episodios de Clinical biochemist.  CAUSAS  Algunas causas comunes son:   Infecciones gastrointestinales causadas por virus, bacterias o parsitos.  Intoxicacin alimentaria o alergias a los alimentos.  Ciertos medicamentos, como los antibiticos, quimioterapia y laxantes.  Edulcorantes artificiales y fructosa.  Los trastornos [employer]. INSTRUCCIONES PARA EL CUIDADO EN EL HOGAR   Asegure una adecuada ingesta de lquidos (hidratacin). Evite los lquidos que contengan azcares simples o las bebidas deportivas, los jugos de frutas, los productos derivados de la leche entera y [place]. Si bebe la cantidad suficiente de lquidos, la orina debe ser clara o amarillo plido. Una solucin de rehidratacin oral se puede comprar en las farmacias, en las tiendas minoristas y por Internet. Se puede preparar una solucin de rehidratacin oral casera con los siguientes ingredientes:   - cucharadita de sal.   cucharadita de bicarbonato.   de cucharadita de sal sustituta que contenga cloruro de potasio.  1  cucharada de azcar.  1l (34 onzas) de agua.  Ciertos alimentos y bebidas pueden aumentar la velocidad a la que el alimento se mueve a travs del tracto gastrointestinal (GI). Estos alimentos y bebidas deben evitarse e incluyen:  Bebidas alcohlicas y con cafena.  Alimentos  ricos en fibra, como frutas y verduras, nueces, semillas, panes y cereales integrales.  Alimentos y bebidas endulzados con alcoholes de azcar, tales como xilitol, sorbitol, y manitol.  Algunos alimentos pueden ser bien tolerados y puede ayudar a [employer], incluyendo:  Alimentos con almidn, como arroz, pan, pasta, cereales bajos en azcar, avena, smola de maz, papas al horno, galletas y panecillos.  Bananas.  Pur de [employer].  Agregue alimentos ricos en probiticos a la dieta del nio para ayudar a aumentar las bacterias saludables en el tracto gastrointestinal, como el yogur y productos lcteos fermentados.  Lvese bien las manos despus de cada episodio de diarrea.  Tome slo medicamentos de venta libre o recetados, segn las indicaciones del [place] un bao caliente para ayudar a disminuir ardor o dolor por los episodios frecuentes de diarrea. SOLICITE ATENCIN MDICA DE INMEDIATO SI:   No puede retener los lquidos.  Tiene vmitos persistentes.  Lollie Marrow en la materia fecal, o las heces son negras y de aspecto alquitranado.  No hay emisin de [place] durante 6 a 8 horas o elimina una pequea cantidad de [place].  Tiene dolor abdominal que aumenta o se localiza.  Est muy mareado o se desvanece.  Sufre un dolor intenso de [place].  La diarrea empeora o no mejora.  Tiene fiebre o sntomas que persisten durante ms de 2 o 3 das.  Tiene fiebre y los sntomas empeoran de manera sbita. ASEGRESE DE QUE:   Comprende estas instrucciones.  Controlar su enfermedad.  Solicitar ayuda de inmediato si no mejora o si empeora. Document Released: 03/31/2005 Document Revised: 03/17/2012 [facility] Patient Information 2015 [place]. This  information is not intended to replace advice given to you by your health care provider. Make sure you discuss any questions you have with your health care provider.   Gastroenteritis viral (Viral  Gastroenteritis) La gastroenteritis viral tambin es conocida como gripe del Turnerestmago. Este trastorno Performance Food Groupafecta el estmago y el tubo digestivo. Puede causar diarrea y vmitos repentinos. La enfermedad generalmente dura entre 3 y [address] das. La Harley-Davidsonmayora de las personas desarrolla una respuesta inmunolgica. Con el tiempo, esto elimina el virus. Mientras se desarrolla esta respuesta natural, el virus puede afectar en forma importante su salud.  CAUSAS Muchos virus diferentes pueden causar gastroenteritis, por ejemplo el rotavirus o el norovirus. Estos virus pueden contagiarse al consumir alimentos o agua contaminados. Tambin puede contagiarse al compartir utensilios u otros artculos personales con una persona infectada o al tocar una superficie contaminada.  SNTOMAS Los sntomas ms comunes son diarrea y vmitos. Estos problemas pueden causar una prdida grave de lquidos corporales(deshidratacin) y un desequilibrio de sales corporales(electrolitos). Otros sntomas pueden ser:   Grant RutsFiebre.  Dolor de Turkmenistancabeza.  Fatiga.  Dolor abdominal. DIAGNSTICO  El mdico podr hacer el diagnstico de gastroenteritis viral basndose en los sntomas y el examen fsico Tambin pueden tomarle una muestra de materia fecal para diagnosticar la presencia de virus u otras infecciones.  TRATAMIENTO Esta enfermedad generalmente desaparece sin tratamiento. Los tratamientos estn dirigidos a Social research officer, governmentla rehidratacin. Los casos ms graves de gastroenteritis viral implican vmitos tan intensos que no es posible retener lquidos. En Franklin Resourcesestos casos, los lquidos deben administrarse a travs de una va intravenosa (IV).  INSTRUCCIONES PARA EL CUIDADO DOMICILIARIO  Beba suficientes lquidos para mantener la orina clara o de color amarillo plido. Beba pequeas cantidades de lquido con frecuencia y aumente la cantidad segn la tolerancia.  Pida instrucciones especficas a su mdico con respecto a la rehidratacin.  Evite:  Alimentos que  Nurse, adulttengan mucha azcar.  Alcohol.  Gaseosas.  TabacoVista Lawman.  Jugos.  Bebidas con cafena.  Lquidos muy calientes o fros.  Alimentos muy grasos.  Comer demasiado a Licensed conveyancerla vez.  Productos lcteos hasta 24 a 48 horas despus de que se detenga la diarrea.  Puede consumir probiticos. Los probiticos son cultivos activos de bacterias beneficiosas. Pueden disminuir la cantidad y el nmero de deposiciones diarreicas en el adulto. Se encuentran en los yogures con cultivos activos y en los suplementos.  Lave bien sus manos para evitar que se disemine el virus.  Slo tome medicamentos de venta libre o recetados para Primary school teachercalmar el dolor, las molestias o bajar la fiebre segn las indicaciones de su mdico. No administre aspirina a los nios. Los medicamentos antidiarreicos no son recomendables.  Consulte a su mdico si puede seguir tomando sus medicamentos recetados o de H. J. Heinzventa libre.  Cumpla con todas las visitas de control, segn le indique su mdico. SOLICITE ATENCIN MDICA DE INMEDIATO SI:  No puede retener lquidos.  No hay emisin de orina durante 6 a 8 horas.  Le falta el aire.  Observa sangre en el vmito (se ve como caf molido) o en la materia fecal.  Siente dolor abdominal que empeora o se concentra en una zona pequea (se localiza).  Tiene nuseas o vmitos persistentes.  Tiene fiebre.  El paciente es un nio menor de 3 meses y Mauritaniatiene fiebre.  El paciente es un nio mayor de 3 meses, tiene fiebre y sntomas persistentes.  El paciente es un nio mayor de 3 meses y tiene fiebre y sntomas que empeoran repentinamente.  El [place]villepaciente es un  beb y no tiene lgrimas cuando llora. ASEGRESE QUE:   Comprende estas instrucciones.  Controlar su enfermedad.  Solicitar ayuda inmediatamente si no mejora o si empeora. Document Released: 03/31/2005 Document Revised: 06/23/2011 [facility] Patient Information 2015 [place], [place]. This information is not intended to replace advice given to  you by your health care provider. Make sure you discuss any questions you have with your health care provider.

## 2014-10-30 NOTE — MAU Note (Addendum)
Since Saturday, she started with pains in her stomach and diarrhea. No one else at home is sick.  Several stools a day    Not going away.  Is sleeping all the time.  Thinks it is related to something she ate

## 2014-11-23 ENCOUNTER — Inpatient Hospital Stay (HOSPITAL_COMMUNITY)
Admission: AD | Admit: 2014-11-23 | Discharge: 2014-11-23 | Disposition: A | Discharge status: Home / Self Care | Payer: Self-pay | Source: Ambulatory Visit | Attending: Obstetrics | Admitting: Obstetrics

## 2014-11-23 ENCOUNTER — Encounter (HOSPITAL_COMMUNITY): Payer: Self-pay

## 2014-11-23 DIAGNOSIS — O368130 Decreased fetal movements, third trimester, not applicable or unspecified: Secondary | ICD-10-CM | POA: Insufficient documentation

## 2014-11-23 DIAGNOSIS — Z3A28 28 weeks gestation of pregnancy: Secondary | ICD-10-CM | POA: Insufficient documentation

## 2014-11-23 DIAGNOSIS — O368131 Decreased fetal movements, third trimester, fetus 1: Secondary | ICD-10-CM

## 2014-11-23 DIAGNOSIS — Z3689 Encounter for other specified antenatal screening: Secondary | ICD-10-CM

## 2014-11-23 LAB — URINALYSIS, ROUTINE W REFLEX MICROSCOPIC
Bilirubin Urine: NEGATIVE
Glucose, UA: NEGATIVE mg/dL
HGB URINE DIPSTICK: NEGATIVE
Ketones, ur: 40 mg/dL — AB
Leukocytes, UA: NEGATIVE
Nitrite: NEGATIVE
PH: 6 (ref 5.0–8.0)
Protein, ur: NEGATIVE mg/dL
SPECIFIC GRAVITY, URINE: 1.025 (ref 1.005–1.030)
Urobilinogen, UA: 0.2 mg/dL (ref 0.0–1.0)

## 2014-11-23 NOTE — MAU Provider Note (Signed)
History     CSN: [id number]  Arrival date and time: 11/23/14 4098   First Provider Initiated Contact with Patient 11/23/14 2037      No chief complaint on file.  HPI Comments: Kerston Landeck is a 34 y.o. G3P1011 at [redacted]w[redacted]d who was sent over after a prenatal visit for a NST. She states that the baby has been less active today. She states that since she has been here the baby has been moving. She denies any vaginal bleeding or leaking of fluid. She is unsure if she has had any contractions. She states that she has had some abdominal pain. She rates her abdominal pain 7/10. She states that she only had one pain while in the waiting room. She did not have her cervix checked in the office today. She denies any recent intercourse.    Past Medical History  Diagnosis Date  . Hypertension     Past Surgical History  Procedure Laterality Date  . Cesarean section      Family History  Problem Relation Age of Onset  . Diabetes Mother   . Hypertension Mother     Social History  Substance Use Topics  . Smoking status: Never Smoker   . Smokeless tobacco: Not on file  . Alcohol Use: No    Allergies: No Known Allergies  Prescriptions prior to admission  Medication Sig Dispense Refill Last Dose  . acetaminophen (TYLENOL) 325 MG tablet Take 325 mg by mouth every 6 (six) hours as needed for mild pain or headache.   Past Week at Unknown time  . Prenatal Vit-Fe Fumarate-FA (PRENATAL MULTIVITAMIN) TABS tablet Take 1 tablet by mouth daily at 12 noon.   11/23/2014 at Unknown time  . loperamide (IMODIUM) 2 MG capsule Take 1-2 capsules (2-4 mg total) by mouth 4 (four) times daily as needed for diarrhea or loose stools. (Patient not taking: Reported on 11/23/2014) 12 capsule 0 Not Taking at Unknown time    Review of Systems  Constitutional: Negative for fever.  Gastrointestinal: Positive for abdominal pain. Negative for nausea, vomiting, diarrhea and constipation.  Genitourinary: Negative  for dysuria, urgency and frequency.   Physical Exam   Blood pressure 128/76, pulse 88, temperature 98.3 F (36.8 C), resp. rate 20, last menstrual period 05/06/2014, SpO2 99 %.  Physical Exam  Nursing note and vitals reviewed. Constitutional: She is oriented to person, place, and time. She appears well-developed and well-nourished. No distress.  Respiratory: Effort normal.  GI: Soft. There is no tenderness. There is no rebound.  Genitourinary:  Cervix: closed/thick/high   Neurological: She is alert and oriented to person, place, and time.  Skin: Skin is warm and dry.  Psychiatric: She has a normal mood and affect.   FHT 125, moderate with 15x15 accels, no decels Toco: no UCs  MAU Course  Procedures  MDM   Assessment and Plan   1. Decreased fetal movement, third trimester, fetus 1   2. NST (non-stress test) reactive   3. [redacted] weeks gestation of pregnancy    DC home 3rd Trimester precautions  PTL precautions  Fetal kick counts RX: none  Return to MAU as needed FU with OB as planned  Follow-up Information    Follow up with Kathreen Cosier, MD.   Specialty:  Obstetrics and Gynecology   Why:  As scheduled   Contact information:   [address] [place] [address] [phone]         Tawnya Crook 11/23/2014, 8:38 PM

## 2014-11-23 NOTE — MAU Note (Signed)
Dr Gaynell Face sent patient from office. Decrease in fetal movement. Here for NST. Denies LOF or vag bleeding. Some cramping.

## 2014-11-23 NOTE — Discharge Instructions (Signed)
Evaluación de los movimientos fetales  °(Fetal Movement Counts) °Nombre del paciente: __________________________________________________ Fecha de parto estimada: ____________________ °La evaluación de los movimientos fetales es muy recomendable en los embarazos de alto riesgo, pero también es una buena idea que lo hagan todas las embarazadas. El médico le indicará que comience a contarlos a las 28 semanas de embarazo. Los movimientos fetales suelen aumentar:  °· Después de una comida completa. °· Después de la actividad física. °· Después de comer o beber algo dulce o frío. °· En reposo. °Preste atención cuando sienta que el bebé está más activo. Esto le ayudará a notar un patrón de ciclos de vigilia y sueño de su bebé y cuáles son los factores que contribuyen a un aumento de los movimientos fetales. Es importante llevar a cabo un recuento de movimientos fetales, al mismo tiempo cada día, cuando el bebé normalmente está más activo.  °CÓMO CONTAR LOS MOVIMIENTOS FETALES °1. Busque un lugar tranquilo y cómodo para sentarse o recostarse sobre el lado izquierdo. Al recostarse sobre su lado izquierdo, le proporciona una mejor circulación de sangre y oxígeno al bebé. °2. Anote el día y la hora en una hoja de papel o en un diario. °3. Comience contando las pataditas, revoloteos, chasquidos, vueltas o pinchazos en un período de 2 horas. Debe sentir al menos 10 movimientos en 2 horas. °4. Si no siente 10 movimientos en 2 horas, espere 2 ó 3 horas y cuente de nuevo. Busque cambios en el patrón o si no cuenta lo suficiente en 2 horas. °SOLICITE ATENCIÓN MÉDICA SI:  °· Siente menos de 10 pataditas en 2 horas, en dos intentos. °· No hay movimientos durante una hora. °· El patrón se modifica o le lleva más tiempo cada día contar las 10 pataditas. °· Siente que el bebé no se mueve como lo hace habitualmente. °Fecha: ____________ Movimientos: ____________ Hora de inicio: ____________ Hora de finalización: ____________  °Fecha:  ____________ Movimientos: ____________ Hora de inicio: ____________ Hora de finalización: ____________  °Fecha: ____________ Movimientos: ____________ Hora de inicio: ____________ Hora de finalización: ____________  °Fecha: ____________ Movimientos: ____________ Hora de inicio: ____________ Hora de finalización: ____________  °Fecha: ____________ Movimientos: ____________ Hora de inicio: ____________ Hora de finalización: ____________  °Fecha: ____________ Movimientos: ____________ Hora de inicio: ____________ Hora de finalización: ____________  °Fecha: ____________ Movimientos: ____________ Hora de inicio: ____________ Hora de finalización: ____________  °Fecha: ____________ Movimientos: ____________ Hora de inicio: ____________ Hora de finalización: ____________  °Fecha: ____________ Movimientos: ____________ Hora de inicio: ____________ Hora de finalización: ____________  °Fecha: ____________ Movimientos: ____________ Hora de inicio: ____________ Hora de finalización: ____________  °Fecha: ____________ Movimientos: ____________ Hora de inicio: ____________ Hora de finalización: ____________  °Fecha: ____________ Movimientos: ____________ Hora de inicio: ____________ Hora de finalización: ____________  °Fecha: ____________ Movimientos: ____________ Hora de inicio: ____________ Hora de finalización: ____________  °Fecha: ____________ Movimientos: ____________ Hora de inicio: ____________ Hora de finalización: ____________  °Fecha: ____________ Movimientos: ____________ Hora de inicio: ____________ Hora de finalización: ____________  °Fecha: ____________ Movimientos: ____________ Hora de inicio: ____________ Hora de finalización: ____________  °Fecha: ____________ Movimientos: ____________ Hora de inicio: ____________ Hora de finalización: ____________  °Fecha: ____________ Movimientos: ____________ Hora de inicio: ____________ Hora de finalización: ____________  °Fecha: ____________ Movimientos: ____________ Hora  de inicio: ____________ Hora de finalización: ____________  °Fecha: ____________ Movimientos: ____________ Hora de inicio: ____________ Hora de finalización: ____________  °Fecha: ____________ Movimientos: ____________ Hora de inicio: ____________ Hora de finalización: ____________  °Fecha: ____________ Movimientos: ____________ Hora de inicio: ____________ Hora de   finalización: ____________  °Fecha: ____________ Movimientos: ____________ Hora de inicio: ____________ Hora de finalización: ____________  °Fecha: ____________ Movimientos: ____________ Hora de inicio: ____________ Hora de finalización: ____________  °Fecha: ____________ Movimientos: ____________ Hora de inicio: ____________ Hora de finalización: ____________  °Fecha: ____________ Movimientos: ____________ Hora de inicio: ____________ Hora de finalización: ____________  °Fecha: ____________ Movimientos: ____________ Hora de inicio: ____________ Hora de finalización: ____________  °Fecha: ____________ Movimientos: ____________ Hora de inicio: ____________ Hora de finalización: ____________  °Fecha: ____________ Movimientos: ____________ Hora de inicio: ____________ Hora de finalización: ____________  °Fecha: ____________ Movimientos: ____________ Hora de inicio: ____________ Hora de finalización: ____________  °Fecha: ____________ Movimientos: ____________ Hora de inicio: ____________ Hora de finalización: ____________  °Fecha: ____________ Movimientos: ____________ Hora de inicio: ____________ Hora de finalización: ____________  °Fecha: ____________ Movimientos: ____________ Hora de inicio: ____________ Hora de finalización: ____________  °Fecha: ____________ Movimientos: ____________ Hora de inicio: ____________ Hora de finalización: ____________  °Fecha: ____________ Movimientos: ____________ Hora de inicio: ____________ Hora de finalización: ____________  °Fecha: ____________ Movimientos: ____________ Hora de inicio: ____________ Hora de finalización:  ____________  °Fecha: ____________ Movimientos: ____________ Hora de inicio: ____________ Hora de finalización: ____________  °Fecha: ____________ Movimientos: ____________ Hora de inicio: ____________ Hora de finalización: ____________  °Fecha: ____________ Movimientos: ____________ Hora de inicio: ____________ Hora de finalización: ____________  °Fecha: ____________ Movimientos: ____________ Hora de inicio: ____________ Hora de finalización: ____________  °Fecha: ____________ Movimientos: ____________ Hora de inicio: ____________ Hora de finalización: ____________  °Fecha: ____________ Movimientos: ____________ Hora de inicio: ____________ Hora de finalización: ____________  °Fecha: ____________ Movimientos: ____________ Hora de inicio: ____________ Hora de finalización: ____________  °Fecha: ____________ Movimientos: ____________ Hora de inicio: ____________ Hora de finalización: ____________  °Fecha: ____________ Movimientos: ____________ Hora de inicio: ____________ Hora de finalización: ____________  °Fecha: ____________ Movimientos: ____________ Hora de inicio: ____________ Hora de finalización: ____________  °Fecha: ____________ Movimientos: ____________ Hora de inicio: ____________ Hora de finalización: ____________  °Fecha: ____________ Movimientos: ____________ Hora de inicio: ____________ Hora de finalización: ____________  °Fecha: ____________ Movimientos: ____________ Hora de inicio: ____________ Hora de finalización: ____________  °Fecha: ____________ Movimientos: ____________ Hora de inicio: ____________ Hora de finalización: ____________  °Fecha: ____________ Movimientos: ____________ Hora de inicio: ____________ Hora de finalización: ____________  °Fecha: ____________ Movimientos: ____________ Hora de inicio: ____________ Hora de finalización: ____________  °Fecha: ____________ Movimientos: ____________ Hora de inicio: ____________ Hora de finalización: ____________  °Fecha: ____________  Movimientos: ____________ Hora de inicio: ____________ Hora de finalización: ____________  °Fecha: ____________ Movimientos: ____________ Hora de inicio: ____________ Hora de finalización: ____________  °Fecha: ____________ Movimientos: ____________ Hora de inicio: ____________ Hora de finalización: ____________  °Document Released: 07/08/2007 Document Revised: 03/17/2012 °ExitCare® Patient Information ©2015 [employer]. This information is not intended to replace advice given to you by your health care provider. Make sure you discuss any questions you have with your health care provider. ° °

## 2015-01-04 LAB — OB RESULTS CONSOLE GBS: GBS: NEGATIVE

## 2015-01-12 ENCOUNTER — Encounter (HOSPITAL_COMMUNITY): Payer: Self-pay

## 2015-01-12 ENCOUNTER — Ambulatory Visit (HOSPITAL_COMMUNITY)
Admission: RE | Admit: 2015-01-12 | Discharge: 2015-01-12 | Disposition: A | Discharge status: Home / Self Care | Payer: Self-pay | Source: Ambulatory Visit | Attending: Obstetrics | Admitting: Obstetrics

## 2015-01-12 DIAGNOSIS — O163 Unspecified maternal hypertension, third trimester: Secondary | ICD-10-CM | POA: Insufficient documentation

## 2015-01-12 DIAGNOSIS — Z3A37 37 weeks gestation of pregnancy: Secondary | ICD-10-CM | POA: Insufficient documentation

## 2015-01-16 ENCOUNTER — Inpatient Hospital Stay (HOSPITAL_COMMUNITY)
Admission: AD | Admit: 2015-01-16 | Discharge: 2015-01-16 | Disposition: A | Discharge status: Home / Self Care | Payer: Medicaid Other | Source: Ambulatory Visit | Attending: Obstetrics | Admitting: Obstetrics

## 2015-01-16 ENCOUNTER — Other Ambulatory Visit (HOSPITAL_COMMUNITY): Payer: Self-pay | Admitting: Obstetrics

## 2015-01-16 ENCOUNTER — Ambulatory Visit (HOSPITAL_COMMUNITY)
Admission: RE | Admit: 2015-01-16 | Discharge: 2015-01-16 | Disposition: A | Discharge status: Home / Self Care | Payer: Self-pay | Source: Ambulatory Visit | Attending: Obstetrics | Admitting: Obstetrics

## 2015-01-16 DIAGNOSIS — Z3A38 38 weeks gestation of pregnancy: Secondary | ICD-10-CM

## 2015-01-16 DIAGNOSIS — Z3A36 36 weeks gestation of pregnancy: Secondary | ICD-10-CM | POA: Insufficient documentation

## 2015-01-16 DIAGNOSIS — S61259A Open bite of unspecified finger without damage to nail, initial encounter: Secondary | ICD-10-CM

## 2015-01-16 DIAGNOSIS — Z3A37 37 weeks gestation of pregnancy: Secondary | ICD-10-CM | POA: Insufficient documentation

## 2015-01-16 DIAGNOSIS — Z3A39 39 weeks gestation of pregnancy: Secondary | ICD-10-CM

## 2015-01-16 DIAGNOSIS — O163 Unspecified maternal hypertension, third trimester: Secondary | ICD-10-CM | POA: Insufficient documentation

## 2015-01-16 DIAGNOSIS — O9A213 Injury, poisoning and certain other consequences of external causes complicating pregnancy, third trimester: Secondary | ICD-10-CM

## 2015-01-16 DIAGNOSIS — R51 Headache: Secondary | ICD-10-CM | POA: Insufficient documentation

## 2015-01-16 DIAGNOSIS — O34219 Maternal care for unspecified type scar from previous cesarean delivery: Secondary | ICD-10-CM

## 2015-01-16 DIAGNOSIS — S61253A Open bite of left middle finger without damage to nail, initial encounter: Secondary | ICD-10-CM

## 2015-01-16 DIAGNOSIS — O26893 Other specified pregnancy related conditions, third trimester: Secondary | ICD-10-CM | POA: Insufficient documentation

## 2015-01-16 DIAGNOSIS — Z331 Pregnant state, incidental: Secondary | ICD-10-CM | POA: Diagnosis not present

## 2015-01-16 DIAGNOSIS — S60413A Abrasion of left middle finger, initial encounter: Secondary | ICD-10-CM

## 2015-01-16 DIAGNOSIS — W540XXA Bitten by dog, initial encounter: Secondary | ICD-10-CM

## 2015-01-16 MED ORDER — AMOXICILLIN-POT CLAVULANATE 875-125 MG PO TABS: 1.0000 | ORAL_TABLET | Freq: Two times a day (BID) | ORAL | Status: DC

## 2015-01-16 NOTE — Discharge Instructions (Signed)
Animal Control: [address] [place], [place] [address] [phone]   Qu debo saber acerca de las lesiones durante el embarazo? (What Do I Need to Know About Injuries During Pregnancy?) Durante el embarazo, pueden producirse lesiones. Generalmente, las cadas y los accidentes menores no la daan ni daan al beb. Sin embargo, debe informar al [employer] sobre cualquier lesin. QU PUEDO HACER PARA PROTEGERME DE LAS LESIONES?  Retire las alfombrillas y los objetos sueltos del piso.  Use un calzado cmodo que tenga buena adherencia. No use zapatos con tacones altos.  Use siempre el cinturn de seguridad. El cinturn del regazo siempre debe colocarse por debajo del abdomen. Conduzca siempre con cuidado.  No ande en motocicleta.  No participe en actividades ni practique deportes de alto impacto.  Evite:  Caminar en pisos mojados o resbalosos.  Los fuegos.  Encender fuegos.  Levantar ollas pesadas que contengan lquidos en ebullicin o calientes.  Arreglar problemas elctricos.  Solo tome los [employer] le haya indicado su mdico.  Conozca su tipo de sangre y el tipo de sangre del padre del beb.  Comunquese con el servicio de emergencias de su localidad (911 en los [place]) si es vctima de violencia o agresin domstica. Para obtener ayuda y apoyo, [place] en contacto con la [place]nea directa nacional para la violencia domstica (National Domestic Violence Hotline). CUNDO DEBO OBTENER AYUDA DE INMEDIATO?  Se cae sobre el abdomen o sufre un accidente o una lesin de alto impacto.  Ha sido vctima de violencia domstica o cualquier tipo de violencia.  Tuvo un accidente automovilstico.  Tiene una hemorragia vaginal.  Tiene una prdida de lquido por la vagina.  Empieza a sentir clicos abdominales (contracciones) o dolor.  Se siente dbil o pierde la conciencia (se desmaya).  Empieza a vomitar despus de sufrir una lesin.  Ha sufrido una  [place].  Tiene dolor o rigidez en el cuello.  Tiene dolor de cabeza o problemas de visin despus de sufrir una lesin.  No siente al beb moverse, o el beb no se mueve tanto como lo hace normalmente. Document Released: 07/16/2010 Document Revised: 08/15/2013 [facility] Patient Information 2015 [place], [place]. This information is not intended to replace advice given to you by your health care provider. Make sure you discuss any questions you have with your health care provider.  Mordedura de Engineer, maintenance  (Lobbyist) La mordedura de Corporate investment banker producir un rasguo de la piel, un corte abierto profundo, una puncin, una lesin por compresin o un desgarro de la piel o de una parte del cuerpo. Los perros son los responsables de la mayora de las mordeduras. Los nios son atacados con ms frecuencia que los adultos. La mordedura de un animal puede ser leve o llegar a ser grave. Una mordedura pequea causada por una mascota no es causa de alarma. Sin embargo, algunas pueden infectarse o llegar a Engineer, drilling un hueso u otros tejidos. Debe solicitar asistencia mdica si:   La piel est abierta y el sangrado no se detiene ni disminuye despus de 15 minutos.  La puncin es profunda y difcil de limpiar (como en el caso de la mordedura de un [place]).  La herida le duele, est caliente, roja o supura pus.  La mordedura la hizo un Haematologist o un roedor. Puede haber riesgo de infeccin por rabia.  La mordedura la hizo una serpiente, un mapache, zorrino, Chief Financial Officer, Tour manager. Puede haber riesgo de infeccin por rabia.  La persona que sufri la mordedura tiene  una enfermedad crnica como diabetes, enfermedad heptica o cncer, o toma medicamentos que disminuyen la accin del sistema inmunolgico.  Hay preocupacin por la ubicacin y la gravedad de la mordedura. Es importante limpiar y proteger la zona de la herida inmediatamente, para evitar la infeccin. Siga estos pasos:   Limpie la  herida con abundante agua y [place].  Baruch Gouty con una crema con antibitico.  Aplique una suave presin sobre la herida con una toalla o una gasa limpia para disminuir o detener el sangrado.  Eleve la zona afectada por arriba del nivel del corazn para Associate Professor.  Pida ayuda mdica. Si recibe asistencia mdica dentro de las 8 horas de la [employer] sern mejores. DIAGNSTICO  El mdico:   Tomar una historia detallada del animal y de la lesin por la mordedura.  Har un examen de la herida.  Realizar una historia clnica. Indicar anlisis o radiografas. En algunos casos se toma una muestra de la herida infectada y se enva al laboratorio para identificar la bacteria que produjo la infeccin.  TRATAMIENTO  El tratamiento mdico depender de la ubicacin y el tipo de mordedura, as como de la historia clnica del [place]. El tratamiento podr incluir:   Cuidados de la herida, como limpieza y enjuague con solucin fisiolgica, vendaje y la elevacin de la zona afectada.  Antibiticos.  Vacuna antitetnica.  Madilyn Fireman antirrbica.  Dejar la herida abierta para que se cure. Generalmente sto se hace debido al riesgo de infeccin. Sin embargo, en ciertos casos, la herida se cierra con puntos, Turner Daniels para heridas, tiras [id number] para la piel o grapas. Las heridas infectadas que no se tratan pueden requerir antibiticos por va intravenosa (IV) y tratamiento quirrgico en el hospital.  INSTRUCCIONES PARA EL CUIDADO EN EL HOGAR   Siga las indicaciones del profesional para el cuidado de las heridas.  [employer] tal como se los han indicado.  Si el profesional que lo asiste le prescribe antibiticos, tmelos tal como se le indic. Tmelos todos, aunque se sienta mejor.  Concurra a las visitas de control con el mdico para Wellsite geologist, o recibir vacunas, segn las indicaciones. Deber aplicarse la vacuna contra el ttanos  si:  No recuerda cundo se coloc la vacuna la ltima vez.  Nunca recibi esta vacuna.  La lesin ha [employer]. Si le han aplicado la vacuna contra el ttanos, el brazo podr hincharse, enrojecer y sentirse caliente al tacto. Esto es frecuente y no es un problema. Si usted necesita aplicarse la vacuna y se niega a recibirla, corre riesgo de contraer ttanos. Esta es una enfermedad que puede ser grave. SOLICITE ATENCIN MDICA SI:   La herida est caliente, roja, hinchada, le duele, supura pus o tiene [employer].  Hay una lnea roja en la piel que sale desde la herida.  Tiene fiebre, escalofros, o una sensacin general de Dentist.  Tiene nuseas o vmitos.  Siente dolor continuo o que [place].  Tiene dificultad para mover la zona lesionada.  Tiene otras preguntas o preocupaciones. ASEGRESE DE QUE:   Comprende estas instrucciones.  Controlar su enfermedad.  Solicitar ayuda de inmediato si no mejora o si empeora. Document Released: 03/20/2011 Document Revised: 06/23/2011 [facility] Patient Information 2015 [place], [place]. This information is not intended to replace advice given to you by your health care provider. Make sure you discuss any questions you have with your health care provider.

## 2015-01-16 NOTE — MAU Provider Note (Signed)
Chief Complaint:  Lobbyist  First Provider Initiated Contact with Patient 01/25/2015 at 1224.    HPI: Tara [name] is a 34 y.o. G3P1011 at [redacted]w[redacted]d who was sent to maternity admissions for evaluation of dog bite 01/13/15. An acquaintance's chihuahua bit her finger. The dog has not been vaccinated, but had not displayed behavior suggestive of rabies before or in the 3 days since the bite occurred.    Location: left middle finger Severity: 0/10 in pain scale Associated signs and symptoms: Neg for fever, chills, bleeding, drainage, swelling, erythema.   In antenatal testing for [facility]  Also C/O pain in left upper molars/chaeek x 1 week. Cannot afford to see dentist.   Past Medical History: Past Medical History  Diagnosis Date  . Hypertension     Past obstetric history: OB History  Gravida Para Term Preterm AB SAB TAB Ectopic Multiple Living  0 1 0 1 0 0 1    # Outcome Date GA Lbr Len/2nd Weight Sex Delivery Anes PTL Lv  3 Current           2 TAB           1 Term               Past Surgical History: Past Surgical History  Procedure Laterality Date  . Cesarean section       Family History: Family History  Problem Relation Age of Onset  . Diabetes Mother   . Hypertension Mother     Social History: Social History  Substance Use Topics  . Smoking status: Never Smoker   . Smokeless tobacco: Not on file  . Alcohol Use: No    Allergies: No Known Allergies  Meds:  Prescriptions prior to admission  Medication Sig Dispense Refill Last Dose  . acetaminophen (TYLENOL) 325 MG tablet Take 325 mg by mouth every 6 (six) hours as needed for mild pain or headache.   Past Week at Unknown time  . LABETALOL HCL PO Take by mouth.     . Prenatal Vit-Fe Fumarate-FA (PRENATAL MULTIVITAMIN) TABS tablet Take 1 tablet by mouth daily at 12 noon.   11/23/2014 at Unknown time    I have reviewed patient's Past Medical Hx, Surgical Hx, Family Hx, Social Hx, medications and  allergies.   ROS:  Review of Systems  Constitutional: Negative for fever and chills.  Gastrointestinal: Negative for abdominal pain.  Genitourinary: Negative for vaginal bleeding.  Skin: Positive for wound.       Neg bleeding, drainage, swelling, erythema    Physical Exam  Patient Vitals for the past 24 hrs:  BP Temp Temp src Pulse Resp  01/16/15 1206 142/88 mmHg 98 F (36.7 C) Oral 90 18   Constitutional: Well-developed, well-nourished female in no acute distress.  Cardiovascular: normal rate Respiratory: normal effort Head: Mild edema of left cheek. No warmth or redness. Mild edema and erythema of left upper gums. No drainage. Molars intact.   Extremities: Right middle finger has 1 mm healing wounds on top and bottom of first joint. No erythema, tenderness, bleeding, drainage or warmth GI: Abd soft, gravid appropriate for gestational age.  Neurologic: Alert and oriented x 4.   FHT: 154    Labs: No results found for this or any previous visit (from the past 24 hour(s)).  Imaging:  NA  MAU Course: Called [facility] to ask about management of dog bite. Usually Tx w/ Augmentin. Give pt animal control contact info.    MDM:  34 year old female at 32 weeks 3 days gestation with minor dog bite from a known dog without evidence of rabies 4 days since the bite. Bite is without evidence of infection, but will treat with Augmentin as a precaution. This should also cover dental infection as well.  Assessment: 1. Dog bite of finger, initial encounter   2. Traumatic injury during pregnancy in third trimester    Plan: Discharge home in stable condition.  Labor precautions and fetal kick counts Encourage patient to follow-up with dentist for dental problems or [place] for evidence of severe infection including fever, severe pain or severe swelling of the face.  Follow-up Information    Follow up with Kathreen Cosier, MD On 01/18/2015.   Specialty:  Obstetrics and Gynecology   Why:   Routine prenatal visit   Contact information:   [address] [place] [address] [phone]       Follow up with [facility].   Why:  As needed in emergencies (fever, redness, swelling or severe pain of finger)   Contact information:   [address] [place] [address]          Medication List    TAKE these medications        acetaminophen 325 MG tablet  Commonly known as:  TYLENOL  Take 325 mg by mouth every 6 (six) hours as needed for mild pain or headache.     amoxicillin-clavulanate 875-125 MG tablet  Commonly known as:  AUGMENTIN  Take 1 tablet by mouth 2 (two) times daily.     LABETALOL HCL PO  Take by mouth.     prenatal multivitamin Tabs tablet  Take 1 tablet by mouth daily at 12 noon.       [place], CNM 01/16/2015 12:24 PM

## 2015-01-16 NOTE — MAU Note (Addendum)
Pt sent to MAU from [facility] for dog bite, was bitten on Saturday.  Pt has very small skin laceration (approximately the size of a small splinter) on her middle finger - R hand.  No bleeding or drainage noted, area not reddened.  Pt states she was told the dog has not had any shots.  Pt also C/o L facial pain, she thinks it is a toothache, it started over a week ago.  Especially painful when she eats.

## 2015-01-19 ENCOUNTER — Other Ambulatory Visit (HOSPITAL_COMMUNITY): Payer: Self-pay

## 2015-01-20 ENCOUNTER — Encounter (HOSPITAL_COMMUNITY): Payer: Self-pay | Admitting: *Deleted

## 2015-01-20 ENCOUNTER — Inpatient Hospital Stay (HOSPITAL_COMMUNITY)
Admission: AD | Admit: 2015-01-20 | Discharge: 2015-01-20 | Disposition: A | Discharge status: Home / Self Care | Payer: No Typology Code available for payment source | Source: Ambulatory Visit | Attending: Obstetrics | Admitting: Obstetrics

## 2015-01-20 DIAGNOSIS — R0981 Nasal congestion: Secondary | ICD-10-CM | POA: Insufficient documentation

## 2015-01-20 DIAGNOSIS — J069 Acute upper respiratory infection, unspecified: Secondary | ICD-10-CM

## 2015-01-20 DIAGNOSIS — Z3A37 37 weeks gestation of pregnancy: Secondary | ICD-10-CM | POA: Insufficient documentation

## 2015-01-20 DIAGNOSIS — O368130 Decreased fetal movements, third trimester, not applicable or unspecified: Secondary | ICD-10-CM

## 2015-01-20 DIAGNOSIS — O26893 Other specified pregnancy related conditions, third trimester: Secondary | ICD-10-CM | POA: Insufficient documentation

## 2015-01-20 DIAGNOSIS — R0602 Shortness of breath: Secondary | ICD-10-CM | POA: Insufficient documentation

## 2015-01-20 DIAGNOSIS — R05 Cough: Secondary | ICD-10-CM | POA: Insufficient documentation

## 2015-01-20 NOTE — MAU Note (Signed)
Urine in lab 

## 2015-01-20 NOTE — MAU Provider Note (Signed)
  History     CSN: [id number]  Arrival date and time: 01/20/15 1449   First Provider Initiated Contact with Patient 01/20/15 1538      Chief Complaint  Patient presents with  . Shortness of Breath  . Cough   HPI  Tara [name] is a 34 y.o. G3P1011 at [redacted]w[redacted]d. She presents with c/o nasal congestion, cough and SOB. She had MAU visit 10/4 for same c/o, is taking Augmentin for URI. She takes Robitussin for non productive cough that is helping but still has nasal congestion. No fever or ear ache. She has noticed decreased fetal movement past 2 says, no cramping, leaking or bleeeding.   OB History    Gravida Para Term Preterm AB TAB SAB Ectopic Multiple Living   0 1 1 0 0 0 1      Past Medical History  Diagnosis Date  . Hypertension     Past Surgical History  Procedure Laterality Date  . Cesarean section      Family History  Problem Relation Age of Onset  . Diabetes Mother   . Hypertension Mother     Social History  Substance Use Topics  . Smoking status: Never Smoker   . Smokeless tobacco: None  . Alcohol Use: No    Allergies: No Known Allergies  Prescriptions prior to admission  Medication Sig Dispense Refill Last Dose  . acetaminophen (TYLENOL) 325 MG tablet Take 325 mg by mouth every 6 (six) hours as needed for mild pain or headache.   Past Week at Unknown time  . amoxicillin-clavulanate (AUGMENTIN) 875-125 MG tablet Take 1 tablet by mouth 2 (two) times daily. 14 tablet 0   . LABETALOL HCL PO Take by mouth.     . Prenatal Vit-Fe Fumarate-FA (PRENATAL MULTIVITAMIN) TABS tablet Take 1 tablet by mouth daily at 12 noon.   11/23/2014 at Unknown time    Review of Systems  Constitutional: Negative for fever and chills.  HENT: Positive for congestion. Negative for sore throat.   Respiratory: Positive for cough and shortness of breath.   Cardiovascular: Negative for chest pain.  Gastrointestinal: Negative for abdominal pain.   Physical Exam   Blood  pressure 126/73, pulse 90, temperature 97.5 F (36.4 C), resp. rate 18, last menstrual period 05/06/2014.  Physical Exam  Nursing note and vitals reviewed. Constitutional: She is oriented to person, place, and time. She appears well-developed and well-nourished.  HENT:  Head: Normocephalic.  Neck: Normal range of motion.  Cardiovascular: Normal rate, regular rhythm and normal heart sounds.   Respiratory: Effort normal and breath sounds normal. No respiratory distress. She has no wheezes. She has no rales.  Musculoskeletal: Normal range of motion.  Neurological: She is alert and oriented to person, place, and time.  Skin: Skin is warm and dry.  Psychiatric: She has a normal mood and affect. Her behavior is normal.    MAU Course  Procedures  MDM Reactive strip  Assessment and Plan  OTC tx reviewed for upper respiratory symptoms Continue antibiotic Rx at last visit Reactive FM strip Keep appt this week with Dr Gaynell Face for [facility], Lamine Laton M. 01/20/2015, 3:54 PM

## 2015-01-20 NOTE — MAU Note (Signed)
Pt reports having chest/nasal congestion and SOB x 3 days. Pt on medication for blood pressure. Pt reports decrease in fetal movement. Denies pain ir cramping a this time.

## 2015-01-20 NOTE — Discharge Instructions (Signed)
Infeccin del tracto respiratorio superior, adultos (Upper Respiratory Infection, Adult) La mayora de las infecciones del tracto respiratorio superior son infecciones virales de las vas que llevan el aire a los pulmones. Un infeccin del tracto respiratorio superior afecta la nariz, la garganta y las vas respiratorias superiores. El tipo ms frecuente de infeccin del tracto respiratorio superior es la nasofaringitis, que habitualmente se conoce como "resfro comn". Las infecciones del tracto respiratorio superior siguen su curso y por lo general se curan solas. En la [employer], la infeccin del tracto respiratorio superior no requiere atencin [place], Biomedical engineer a veces, despus de una infeccin viral, puede surgir una infeccin bacteriana en las vas respiratorias superiores. Esto se conoce como infeccin secundaria. Las infecciones sinusales y en el odo medio son tipos frecuentes de infecciones secundarias en el tracto respiratorio superior. La neumona bacteriana tambin puede complicar un cuadro de infeccin del tracto respiratorio superior. Este tipo de infeccin puede empeorar el asma y la enfermedad pulmonar obstructiva crnica (EPOC). En algunos casos, estas complicaciones pueden requerir atencin mdica de emergencia y poner en peligro la vida.  CAUSAS Casi todas las infecciones del tracto respiratorio superior se deben a los virus. Un virus es un tipo de microbio que puede contagiarse de Neomia Dear persona a Educational psychologist.  FACTORES DE RIESGO Puede estar en riesgo de sufrir una infeccin del tracto respiratorio superior si:   Fuma.  Tiene una enfermedad pulmonar o cardaca crnica.  Tiene debilitado el sistema de defensa (inmunitario) del cuerpo.  Es [address] o de edad muy [place].  Tiene asma o alergias nasales.  Trabaja en reas donde hay mucha gente o poca ventilacin.  Rudi Coco en una escuela o en un centro de atencin mdica. SIGNOS Y SNTOMAS  Habitualmente, los sntomas aparecen  de 2a 3das despus de entrar en contacto con el virus del resfro. La mayora de las infecciones virales en el tracto respiratorio superior duran de 7a 10das. Sin embargo, las infecciones virales en el tracto respiratorio superior a causa del virus de la gripe pueden durar de 14a 18das y, habitualmente, son ms graves. Entre los sntomas se pueden incluir los siguientes:  1. Secrecin o congestin nasal. 2. Estornudos. 3. Tos. 4. Dolor de garganta. 5. Dolor de [place]. 6. Fatiga. 7. Grant Ruts. 8. Prdida del apetito. 9. Dolor en la frente, detrs de los ojos y por encima de los pmulos (dolor sinusal). 10. Dolores musculares. DIAGNSTICO  El mdico puede diagnosticar una infeccin del tracto respiratorio superior mediante los siguientes estudios:  Examen fsico.  Pruebas para verificar si los sntomas no se deben a otra afeccin, por ejemplo:  Faringitis estreptoccica.  Sinusitis.  Neumona.  Asma. TRATAMIENTO  Esta infeccin desaparece sola, con el tiempo. No puede curarse con medicamentos, pero a menudo se prescriben para aliviar los sntomas. Los medicamentos pueden ser tiles para lo siguiente:  Personal assistant fiebre.  Reducir la tos.  Aliviar la congestin nasal. INSTRUCCIONES PARA EL CUIDADO EN EL HOGAR   Tome los medicamentos solamente como se lo haya indicado el mdico.  A fin de Engineer, materials de garganta, haga grgaras con solucin salina templada o consuma caramelos para la tos, como se lo haya indicado el mdico.  Use un humidificador de vapor clido o inhale el vapor de la ducha para aumentar la humedad del aire. Esto facilitar la respiracin.  Beba suficiente lquido para Photographer orina clara o de color amarillo plido.  Consuma sopas y otros caldos transparentes, y [employer].  Descanse todo lo  que sea necesario.  Regrese al Aleen Campi cuando la temperatura se le haya normalizado o cuando el mdico lo autorice. Es posible que deba quedarse en su  casa durante un tiempo prolongado, para no infectar a los dems. Tambin puede usar un barbijo y lavarse las manos con cuidado para Transport planner propagacin del virus.  Aumente el uso del inhalador si tiene asma.  No consuma ningn producto que contenga tabaco, lo que incluye cigarrillos, tabaco de Theatre manager o Administrator, Civil Service. Si necesita ayuda para dejar de fumar, consulte al [employer]. PREVENCIN  La mejor manera de protegerse de un resfro es mantener una higiene [place].   Evite el contacto oral o fsico con personas que tengan sntomas de resfro.  En caso de contacto, lvese las manos con frecuencia. No hay pruebas claras de que la vitaminaC, la vitaminaE, la equincea o el ejercicio reduzcan la probabilidad de Primary school teacher un resfro. Sin embargo, siempre se recomienda Insurance account manager, hacer ejercicio y Engineering geologist.  SOLICITE ATENCIN MDICA SI:   Su estado empeora en lugar de mejorar.  Los medicamentos no Estate agent.  Tiene escalofros.  La sensacin de falta de aire empeora.  Tiene mucosidad marrn o roja.  Tiene secrecin nasal amarilla o marrn.  Le duele la cara, especialmente al inclinarse hacia adelante.  Tiene fiebre.  Tiene los ganglios del cuello hinchados.  Siente dolor al tragar.  Tiene zonas blancas en la parte de atrs de la garganta. SOLICITE ATENCIN MDICA DE INMEDIATO SI:   Tiene sntomas intensos o persistentes de:  Dolor de [place].  Dolor de odos.  Dolor sinusal.  Dolor en el pecho.  Tiene enfermedad pulmonar crnica y cualquiera de estos sntomas:  Sibilancias.  Tos prolongada.  Tos con sangre.  Cambio en la mucosidad habitual.  Presenta rigidez en el cuello.  Tiene cambios en:  La visin.  La audicin.  El pensamiento.  El Anderson de nimo. ASEGRESE DE QUE:   Comprende estas instrucciones.  Controlar su afeccin.  Recibir ayuda de inmediato si no mejora o si empeora.   Esta informacin  no tiene Theme park manager el consejo del mdico. Asegrese de hacerle al mdico cualquier pregunta que tenga.   Document Released: 01/08/2005 Document Revised: 08/15/2014 Elsevier Interactive Patient Education 2016 [employer].  Evaluacin de los movimientos fetales  (Fetal Movement Counts) Nombre del paciente: __________________________________________________ Tara [name] estimada: ____________________ Caroleen Hamman de los movimientos fetales es muy recomendable en los embarazos de alto riesgo, pero tambin es una buena idea que lo hagan todas las [place]. El Firefighter que comience a contarlos a las 28 semanas de [place]. Los movimientos fetales suelen aumentar:   Despus de Animator.  Despus de la actividad fsica.  Despus de comer o beber [employer] o fro.  En reposo. Preste atencin cuando sienta que el beb est ms activo. Esto le ayudar a notar un patrn de ciclos de vigilia y sueo de su beb y cules son los factores que contribuyen a un aumento de los movimientos fetales. Es importante llevar a cabo un recuento de movimientos fetales, al mismo tiempo cada da, cuando el beb normalmente est ms activo.  CMO CONTAR LOS MOVIMIENTOS FETALES 11. Busque un lugar tranquilo y cmodo para sentarse o recostarse sobre el lado izquierdo. Al recostarse sobre su lado izquierdo, le proporciona una mejor circulacin de [place] y oxgeno al beb. 12. Anote el da y la hora en una hoja de papel o en un diario. 13. Comience contando  las pataditas, revoloteos, chasquidos, vueltas o pinchazos en un perodo de 2 horas. Debe sentir al menos 10 movimientos en 2 horas. 14. Si no siente 10 movimientos en 2 horas, espere 2  3 horas y cuente de nuevo. Busque cambios en el patrn o si no cuenta lo suficiente en 2 horas. SOLICITE ATENCIN MDICA SI:   Siente menos de 10 pataditas en 2 horas, en dos intentos.  No hay movimientos durante una hora.  El patrn se modifica  o le lleva ms tiempo Art gallery manager las 10 pataditas.  Siente que el beb no se mueve como lo hace habitualmente. Fecha: ____________ Movimientos: ____________ Stevan Born inicio: ____________ Stevan Born finalizacin: ____________  Franco Nones: ____________ Movimientos: ____________ Stevan Born inicio: ____________ Stevan Born finalizacin: ____________  Franco Nones: ____________ Movimientos: ____________ Stevan Born inicio: ____________ Stevan Born finalizacin: ____________  Franco Nones: ____________ Movimientos: ____________ Stevan Born inicio: ____________ Stevan Born finalizacin: ____________  Franco Nones: ____________ Movimientos: ____________ Stevan Born inicio: ____________ Stevan Born finalizacin: ____________  Franco Nones: ____________ Movimientos: ____________ Mammie Russian de inicio: ____________ Mammie Russian de finalizacin: ____________  Franco Nones: ____________ Movimientos: ____________ Mammie Russian de inicio: ____________ Mammie Russian de finalizacin: ____________  Franco Nones: ____________ Movimientos: ____________ Mammie Russian de inicio: ____________ Mammie Russian de finalizacin: ____________  Franco Nones: ____________ Movimientos: ____________ Mammie Russian de inicio: ____________ Mammie Russian de finalizacin: ____________  Franco Nones: ____________ Movimientos: ____________ Mammie Russian de inicio: ____________ Mammie Russian de finalizacin: ____________  Franco Nones: ____________ Movimientos: ____________ Mammie Russian de inicio: ____________ Mammie Russian de finalizacin: ____________  Franco Nones: ____________ Movimientos: ____________ Mammie Russian de inicio: ____________ Mammie Russian de finalizacin: ____________  Franco Nones: ____________ Movimientos: ____________ Mammie Russian de inicio: ____________ Mammie Russian de finalizacin: ____________  Franco Nones: ____________ Movimientos: ____________ Mammie Russian de inicio: ____________ Mammie Russian de finalizacin: ____________  Franco Nones: ____________ Movimientos: ____________ Mammie Russian de inicio: ____________ Mammie Russian de finalizacin: ____________  Franco Nones: ____________ Movimientos: ____________ Mammie Russian de inicio: ____________ Mammie Russian de finalizacin: ____________  Franco Nones: ____________ Movimientos:  ____________ Mammie Russian de inicio: ____________ Mammie Russian de finalizacin: ____________  Franco Nones: ____________ Movimientos: ____________ Mammie Russian de inicio: ____________ Mammie Russian de finalizacin: ____________  Franco Nones: ____________ Movimientos: ____________ Mammie Russian de inicio: ____________ Mammie Russian de finalizacin: ____________  Franco Nones: ____________ Movimientos: ____________ Mammie Russian de inicio: ____________ Mammie Russian de finalizacin: ____________  Franco Nones: ____________ Movimientos: ____________ Mammie Russian de inicio: ____________ Mammie Russian de finalizacin: ____________  Franco Nones: ____________ Movimientos: ____________ Mammie Russian de inicio: ____________ Mammie Russian de finalizacin: ____________  Franco Nones: ____________ Movimientos: ____________ Mammie Russian de inicio: ____________ Mammie Russian de finalizacin: ____________  Franco Nones: ____________ Movimientos: ____________ Mammie Russian de inicio: ____________ Mammie Russian de finalizacin: ____________  Franco Nones: ____________ Movimientos: ____________ Mammie Russian de inicio: ____________ Mammie Russian de finalizacin: ____________  Franco Nones: ____________ Movimientos: ____________ Mammie Russian de inicio: ____________ Mammie Russian de finalizacin: ____________  Franco Nones: ____________ Movimientos: ____________ Mammie Russian de inicio: ____________ Mammie Russian de finalizacin: ____________  Franco Nones: ____________ Movimientos: ____________ Mammie Russian de inicio: ____________ Mammie Russian de finalizacin: ____________  Franco Nones: ____________ Movimientos: ____________ Mammie Russian de inicio: ____________ Mammie Russian de finalizacin: ____________  Franco Nones: ____________ Movimientos: ____________ Mammie Russian de inicio: ____________ Mammie Russian de finalizacin: ____________  Franco Nones: ____________ Movimientos: ____________ Mammie Russian de inicio: ____________ Mammie Russian de finalizacin: ____________  Franco Nones: ____________ Movimientos: ____________ Mammie Russian de inicio: ____________ Mammie Russian de finalizacin: ____________  Franco Nones: ____________ Movimientos: ____________ Mammie Russian de inicio: ____________ Mammie Russian de finalizacin: ____________  Franco Nones: ____________ Movimientos: ____________ Mammie Russian de inicio: ____________  Mammie Russian de finalizacin: ____________  Franco Nones: ____________ Movimientos: ____________ Mammie Russian de inicio: ____________ Mammie Russian de finalizacin: ____________  Franco Nones: ____________ Movimientos: ____________ Mammie Russian de inicio: ____________ Mammie Russian de finalizacin: ____________  Franco Nones: ____________ Movimientos: ____________ Mammie Russian de inicio: ____________ Mammie Russian de finalizacin: ____________  Franco Nones: ____________ Movimientos: ____________ Mammie Russian de inicio: ____________ Stevan Born  finalizacin: ____________  Franco Nones: ____________ Movimientos: ____________ Stevan Born inicio: ____________ Stevan Born finalizacin: ____________  Franco Nones: ____________ Movimientos: ____________ Stevan Born inicio: ____________ Stevan Born finalizacin: ____________  Franco Nones: ____________ Movimientos: ____________ Stevan Born inicio: ____________ Stevan Born finalizacin: ____________  Franco Nones: ____________ Movimientos: ____________ Stevan Born inicio: ____________ Stevan Born finalizacin: ____________  Franco Nones: ____________ Movimientos: ____________ Stevan Born inicio: ____________ Mammie Russian de finalizacin: ____________  Franco Nones: ____________ Movimientos: ____________ Mammie Russian de inicio: ____________ Mammie Russian de finalizacin: ____________  Franco Nones: ____________ Movimientos: ____________ Mammie Russian de inicio: ____________ Mammie Russian de finalizacin: ____________  Franco Nones: ____________ Movimientos: ____________ Mammie Russian de inicio: ____________ Mammie Russian de finalizacin: ____________  Franco Nones: ____________ Movimientos: ____________ Mammie Russian de inicio: ____________ Mammie Russian de finalizacin: ____________  Franco Nones: ____________ Movimientos: ____________ Mammie Russian de inicio: ____________ Mammie Russian de finalizacin: ____________  Franco Nones: ____________ Movimientos: ____________ Mammie Russian de inicio: ____________ Mammie Russian de finalizacin: ____________  Franco Nones: ____________ Movimientos: ____________ Mammie Russian de inicio: ____________ Mammie Russian de finalizacin: ____________  Franco Nones: ____________ Movimientos: ____________ Mammie Russian de inicio: ____________ Mammie Russian de finalizacin: ____________  Franco Nones:  ____________ Movimientos: ____________ Mammie Russian de inicio: ____________ Mammie Russian de finalizacin: ____________  Franco Nones: ____________ Movimientos: ____________ Mammie Russian de inicio: ____________ Mammie Russian de finalizacin: ____________  Franco Nones: ____________ Movimientos: ____________ Mammie Russian de inicio: ____________ Mammie Russian de finalizacin: ____________  Franco Nones: ____________ Movimientos: ____________ Stevan Born inicio: ____________ Stevan Born finalizacin: ____________  Franco Nones: ____________ Movimientos: ____________ Stevan Born inicio: ____________ Mammie Russian de finalizacin: ____________    Esta informacin no tiene como fin reemplazar el consejo del mdico. Asegrese de hacerle al mdico cualquier pregunta que tenga.   Document Released: 07/08/2007 Document Revised: 03/17/2012 Elsevier Interactive Patient Education [employer]

## 2015-01-21 NOTE — Progress Notes (Signed)
Assisted RN with interpretation of patient discharge.  °Spanish Interpreter  °

## 2015-01-21 NOTE — Progress Notes (Signed)
Assisted RN with interpretation of patient assessment.   °Spanish Interpreter °

## 2015-01-21 NOTE — Progress Notes (Signed)
Assisted NP with interpretation of patient assessment.  °Spanish Interpreter  °

## 2015-01-23 ENCOUNTER — Ambulatory Visit (HOSPITAL_COMMUNITY)
Admission: RE | Admit: 2015-01-23 | Discharge: 2015-01-23 | Disposition: A | Discharge status: Home / Self Care | Payer: Self-pay | Source: Ambulatory Visit | Attending: Obstetrics | Admitting: Obstetrics

## 2015-01-23 DIAGNOSIS — Z3A39 39 weeks gestation of pregnancy: Secondary | ICD-10-CM | POA: Insufficient documentation

## 2015-01-23 DIAGNOSIS — O163 Unspecified maternal hypertension, third trimester: Secondary | ICD-10-CM | POA: Insufficient documentation

## 2015-01-26 ENCOUNTER — Other Ambulatory Visit (HOSPITAL_COMMUNITY): Payer: Self-pay

## 2015-01-29 ENCOUNTER — Encounter (HOSPITAL_COMMUNITY): Payer: Self-pay | Admitting: *Deleted

## 2015-01-29 ENCOUNTER — Telehealth (HOSPITAL_COMMUNITY): Payer: Self-pay | Admitting: *Deleted

## 2015-01-29 NOTE — Telephone Encounter (Signed)
Preadmission screen [id number] interpreter number

## 2015-01-30 ENCOUNTER — Encounter (HOSPITAL_COMMUNITY): Payer: Self-pay

## 2015-01-30 ENCOUNTER — Inpatient Hospital Stay (HOSPITAL_COMMUNITY)
Admission: RE | Admit: 2015-01-30 | Discharge: 2015-02-01 | DRG: 775 | Disposition: A | Discharge status: Home / Self Care | Payer: Medicaid Other | Source: Ambulatory Visit | Attending: Obstetrics | Admitting: Obstetrics

## 2015-01-30 ENCOUNTER — Inpatient Hospital Stay (HOSPITAL_COMMUNITY): Payer: Medicaid Other | Admitting: Anesthesiology

## 2015-01-30 ENCOUNTER — Ambulatory Visit (HOSPITAL_COMMUNITY): Payer: Medicaid Other

## 2015-01-30 DIAGNOSIS — O99214 Obesity complicating childbirth: Secondary | ICD-10-CM | POA: Diagnosis present

## 2015-01-30 DIAGNOSIS — Z6832 Body mass index (BMI) 32.0-32.9, adult: Secondary | ICD-10-CM | POA: Diagnosis not present

## 2015-01-30 DIAGNOSIS — E669 Obesity, unspecified: Secondary | ICD-10-CM | POA: Diagnosis present

## 2015-01-30 DIAGNOSIS — O34211 Maternal care for low transverse scar from previous cesarean delivery: Secondary | ICD-10-CM | POA: Diagnosis present

## 2015-01-30 DIAGNOSIS — Z3490 Encounter for supervision of normal pregnancy, unspecified, unspecified trimester: Secondary | ICD-10-CM

## 2015-01-30 DIAGNOSIS — Z3A39 39 weeks gestation of pregnancy: Secondary | ICD-10-CM | POA: Diagnosis not present

## 2015-01-30 DIAGNOSIS — O164 Unspecified maternal hypertension, complicating childbirth: Secondary | ICD-10-CM | POA: Diagnosis present

## 2015-01-30 LAB — CBC
HCT: 34.9 % — ABNORMAL LOW (ref 36.0–46.0)
HEMOGLOBIN: 11.5 g/dL — AB (ref 12.0–15.0)
MCH: 29.3 pg (ref 26.0–34.0)
MCHC: 33 g/dL (ref 30.0–36.0)
MCV: 89 fL (ref 78.0–100.0)
PLATELETS: 338 10*3/uL (ref 150–400)
RBC: 3.92 MIL/uL (ref 3.87–5.11)
RDW: 14.9 % (ref 11.5–15.5)
WBC: 10.8 10*3/uL — ABNORMAL HIGH (ref 4.0–10.5)

## 2015-01-30 LAB — TYPE AND SCREEN
ABO/RH(D): O POS
Antibody Screen: NEGATIVE

## 2015-01-30 LAB — RPR: RPR: NONREACTIVE

## 2015-01-30 MED ORDER — CITRIC ACID-SODIUM CITRATE 334-500 MG/5ML PO SOLN: 30.0000 mL | ORAL | Status: DC | PRN

## 2015-01-30 MED ORDER — TERBUTALINE SULFATE 1 MG/ML IJ SOLN
0.2500 mg | Freq: Once | INTRAMUSCULAR | Status: DC | PRN
  Filled 2015-01-30: qty 1

## 2015-01-30 MED ORDER — FENTANYL 2.5 MCG/ML BUPIVACAINE 1/10 % EPIDURAL INFUSION (WH - ANES)
14.0000 mL/h | INTRAMUSCULAR | Status: DC | PRN
  Administered 2015-01-30 (×3): 14 mL/h via EPIDURAL
  Filled 2015-01-30 (×2): qty 125

## 2015-01-30 MED ORDER — EPHEDRINE 5 MG/ML INJ
10.0000 mg | INTRAVENOUS | Status: DC | PRN
  Filled 2015-01-30: qty 2

## 2015-01-30 MED ORDER — ACETAMINOPHEN 325 MG PO TABS: 650.0000 mg | ORAL_TABLET | ORAL | Status: DC | PRN

## 2015-01-30 MED ORDER — ONDANSETRON HCL 4 MG/2ML IJ SOLN
4.0000 mg | Freq: Four times a day (QID) | INTRAMUSCULAR | Status: DC | PRN
  Administered 2015-01-30: 4 mg via INTRAVENOUS
  Filled 2015-01-30: qty 2

## 2015-01-30 MED ORDER — OXYTOCIN BOLUS FROM INFUSION
500.0000 mL | INTRAVENOUS | Status: DC
  Administered 2015-01-31: 500 mL via INTRAVENOUS

## 2015-01-30 MED ORDER — FLEET ENEMA 7-19 GM/118ML RE ENEM: 1.0000 | ENEMA | Freq: Every day | RECTAL | Status: DC | PRN

## 2015-01-30 MED ORDER — OXYCODONE-ACETAMINOPHEN 5-325 MG PO TABS: 2.0000 | ORAL_TABLET | ORAL | Status: DC | PRN

## 2015-01-30 MED ORDER — LIDOCAINE HCL (PF) 1 % IJ SOLN
INTRAMUSCULAR | Status: DC | PRN
  Administered 2015-01-30 (×2): 4 mL

## 2015-01-30 MED ORDER — OXYTOCIN 40 UNITS IN LACTATED RINGERS INFUSION - SIMPLE MED: 62.5000 mL/h | INTRAVENOUS | Status: DC

## 2015-01-30 MED ORDER — OXYTOCIN 40 UNITS IN LACTATED RINGERS INFUSION - SIMPLE MED
INTRAVENOUS | Status: AC
  Filled 2015-01-30: qty 1000

## 2015-01-30 MED ORDER — LACTATED RINGERS IV SOLN
INTRAVENOUS | Status: DC
  Administered 2015-01-30 (×2): via INTRAVENOUS
  Administered 2015-01-31: 1000 mL via INTRAVENOUS

## 2015-01-30 MED ORDER — LIDOCAINE HCL (PF) 1 % IJ SOLN
30.0000 mL | INTRAMUSCULAR | Status: DC | PRN
  Administered 2015-01-31: 30 mL via SUBCUTANEOUS
  Filled 2015-01-30: qty 30

## 2015-01-30 MED ORDER — DIPHENHYDRAMINE HCL 50 MG/ML IJ SOLN: 12.5000 mg | INTRAMUSCULAR | Status: DC | PRN

## 2015-01-30 MED ORDER — OXYTOCIN 40 UNITS IN LACTATED RINGERS INFUSION - SIMPLE MED
1.0000 m[IU]/min | INTRAVENOUS | Status: DC
Start: 2015-01-30 — End: 2015-01-31
  Administered 2015-01-30: 2 m[IU]/min via INTRAVENOUS
  Filled 2015-01-30: qty 1000

## 2015-01-30 MED ORDER — OXYCODONE-ACETAMINOPHEN 5-325 MG PO TABS: 1.0000 | ORAL_TABLET | ORAL | Status: DC | PRN

## 2015-01-30 MED ORDER — PHENYLEPHRINE 40 MCG/ML (10ML) SYRINGE FOR IV PUSH (FOR BLOOD PRESSURE SUPPORT)
80.0000 ug | PREFILLED_SYRINGE | INTRAVENOUS | Status: DC | PRN
  Filled 2015-01-30: qty 20
  Filled 2015-01-30: qty 2

## 2015-01-30 MED ORDER — LACTATED RINGERS IV SOLN
500.0000 mL | INTRAVENOUS | Status: DC | PRN
  Administered 2015-01-30: 1000 mL via INTRAVENOUS

## 2015-01-30 NOTE — Progress Notes (Signed)
Patient ID: Tara [name], female   DOB: 08/19/1980, 34 y.o.   MRN: [id number] Patient is now 2 cm 90% vertex -2 she has her epidural and membranes were ruptured artificially fluids thin meconium and IUPC inserted and she is contracting regularly

## 2015-01-30 NOTE — Anesthesia Procedure Notes (Signed)
Epidural Patient location during procedure: OB  Staffing Anesthesiologist: Aviella Disbrow Performed by: anesthesiologist   Preanesthetic Checklist Completed: patient identified, site marked, surgical consent, pre-op evaluation, timeout performed, IV checked, risks and benefits discussed and monitors and equipment checked  Epidural Patient position: sitting Prep: site prepped and draped and DuraPrep Patient monitoring: continuous pulse ox and blood pressure Approach: midline Location: L3-L4 Injection technique: LOR saline  Needle:  Needle type: Tuohy  Needle gauge: 17 G Needle length: 9 cm and 9 Needle insertion depth: 6 cm Catheter type: closed end flexible Catheter size: 19 Gauge Catheter at skin depth: 10 cm Test dose: negative  Assessment Events: blood not aspirated, injection not painful, no injection resistance, negative IV test and no paresthesia  Additional Notes Patient identified. Risks/Benefits/Options discussed with patient including but not limited to bleeding, infection, nerve damage, paralysis, failed block, incomplete pain control, headache, blood pressure changes, nausea, vomiting, reactions to medication both or allergic, itching and postpartum back pain. Confirmed with bedside nurse the patient's most recent platelet count. Confirmed with patient that they are not currently taking any anticoagulation, have any bleeding history or any family history of bleeding disorders. Patient expressed understanding and wished to proceed. All questions were answered. Sterile technique was used throughout the entire procedure. Please see nursing notes for vital signs. Test dose was given through epidural catheter and negative prior to continuing to dose epidural or start infusion. Warning signs of high block given to the patient including shortness of breath, tingling/numbness in hands, complete motor block, or any concerning symptoms with instructions to call for help. Patient was  given instructions on fall risk and not to get out of bed. All questions and concerns addressed with instructions to call with any issues or inadequate analgesia.    

## 2015-01-30 NOTE — Anesthesia Preprocedure Evaluation (Signed)
Anesthesia Evaluation  Patient identified by MRN, date of birth, ID band Patient awake    Reviewed: Allergy & Precautions, NPO status , Patient's Chart, lab work & pertinent test results  History of Anesthesia Complications Negative for: history of anesthetic complications  Airway Mallampati: II  TM Distance: >3 FB Neck ROM: Full    Dental no notable dental hx. (+) Dental Advisory Given   Pulmonary neg pulmonary ROS,    Pulmonary exam normal breath sounds clear to auscultation       Cardiovascular hypertension, Pt. on medications Normal cardiovascular exam Rhythm:Regular Rate:Normal     Neuro/Psych negative neurological ROS  negative psych ROS   GI/Hepatic negative GI ROS, Neg liver ROS,   Endo/Other  obesity  Renal/GU negative Renal ROS  negative genitourinary   Musculoskeletal negative musculoskeletal ROS (+)   Abdominal   Peds negative pediatric ROS (+)  Hematology negative hematology ROS (+)   Anesthesia Other Findings   Reproductive/Obstetrics (+) Pregnancy                             Anesthesia Physical Anesthesia Plan  ASA: II  Anesthesia Plan: Epidural   Post-op Pain Management:    Induction:   Airway Management Planned:   Additional Equipment:   Intra-op Plan:   Post-operative Plan:   Informed Consent: I have reviewed the patients History and Physical, chart, labs and discussed the procedure including the risks, benefits and alternatives for the proposed anesthesia with the patient or authorized representative who has indicated his/her understanding and acceptance.     Plan Discussed with: CRNA  Anesthesia Plan Comments:         Anesthesia Quick Evaluation

## 2015-01-30 NOTE — H&P (Signed)
This is Dr. Francoise CeoBernard Cyprus Kuang dictating the history and physical on  Tara [name]  Tara [name] is a 34 year old gravida 3 para 1011 at 5739 weeks followed by MFM because of chronic hypertension Tara [name] is on labetalol 200 mg by mouth daily and Tara [name]'s brought in for induction Tara [name] had [place] C[place] and and the present time Tara [name] is contracting on low-dose Pitocin and her cervix is a fingertip and the vertex -3 station Past medical history negative Past surgical history Tara [name] states Tara [name] had a C-section [place] because of chronic hypertension Social history negative Family history negative System review negative Physical exam well-developed female in early labor HEENT negative Lungs clear to P&A Heart regular rhythm no murmurs no gallops Breasts negative Abdomen term Pelvic as described above Extremities negative

## 2015-01-31 ENCOUNTER — Encounter (HOSPITAL_COMMUNITY): Payer: Self-pay

## 2015-01-31 LAB — CBC
HCT: 33.4 % — ABNORMAL LOW (ref 36.0–46.0)
Hemoglobin: 11.1 g/dL — ABNORMAL LOW (ref 12.0–15.0)
MCH: 29.7 pg (ref 26.0–34.0)
MCHC: 33.2 g/dL (ref 30.0–36.0)
MCV: 89.3 fL (ref 78.0–100.0)
Platelets: 319 10*3/uL (ref 150–400)
RBC: 3.74 MIL/uL — ABNORMAL LOW (ref 3.87–5.11)
RDW: 14.8 % (ref 11.5–15.5)
WBC: 16.7 10*3/uL — ABNORMAL HIGH (ref 4.0–10.5)

## 2015-01-31 MED ORDER — ACETAMINOPHEN 325 MG PO TABS
650.0000 mg | ORAL_TABLET | ORAL | Status: DC | PRN
  Administered 2015-01-31 (×2): 650 mg via ORAL
  Filled 2015-01-31 (×2): qty 2

## 2015-01-31 MED ORDER — LABETALOL HCL 200 MG PO TABS
200.0000 mg | ORAL_TABLET | Freq: Two times a day (BID) | ORAL | Status: DC
  Administered 2015-01-31 – 2015-02-01 (×3): 200 mg via ORAL
  Filled 2015-01-31 (×3): qty 1

## 2015-01-31 MED ORDER — DIBUCAINE 1 % RE OINT: 1.0000 "application " | TOPICAL_OINTMENT | RECTAL | Status: DC | PRN

## 2015-01-31 MED ORDER — PRENATAL MULTIVITAMIN CH
1.0000 | ORAL_TABLET | Freq: Every day | ORAL | Status: DC
  Administered 2015-01-31 – 2015-02-01 (×2): 1 via ORAL
  Filled 2015-01-31 (×2): qty 1

## 2015-01-31 MED ORDER — LANOLIN HYDROUS EX OINT
TOPICAL_OINTMENT | CUTANEOUS | Status: DC | PRN
Start: 2015-01-31 — End: 2015-02-01

## 2015-01-31 MED ORDER — TETANUS-DIPHTH-ACELL PERTUSSIS 5-2.5-18.5 LF-MCG/0.5 IM SUSP
0.5000 mL | Freq: Once | INTRAMUSCULAR | Status: AC
  Administered 2015-02-01: 0.5 mL via INTRAMUSCULAR
  Filled 2015-01-31: qty 0.5

## 2015-01-31 MED ORDER — SENNOSIDES-DOCUSATE SODIUM 8.6-50 MG PO TABS
2.0000 | ORAL_TABLET | ORAL | Status: DC
  Filled 2015-01-31: qty 2

## 2015-01-31 MED ORDER — BENZOCAINE-MENTHOL 20-0.5 % EX AERO
1.0000 "application " | INHALATION_SPRAY | CUTANEOUS | Status: DC | PRN
  Administered 2015-01-31: 1 via TOPICAL
  Filled 2015-01-31: qty 56

## 2015-01-31 MED ORDER — ONDANSETRON HCL 4 MG PO TABS: 4.0000 mg | ORAL_TABLET | ORAL | Status: DC | PRN

## 2015-01-31 MED ORDER — FERROUS SULFATE 325 (65 FE) MG PO TABS
325.0000 mg | ORAL_TABLET | Freq: Two times a day (BID) | ORAL | Status: DC
  Administered 2015-01-31 – 2015-02-01 (×3): 325 mg via ORAL
  Filled 2015-01-31 (×3): qty 1

## 2015-01-31 MED ORDER — DIPHENHYDRAMINE HCL 25 MG PO CAPS: 25.0000 mg | ORAL_CAPSULE | Freq: Four times a day (QID) | ORAL | Status: DC | PRN

## 2015-01-31 MED ORDER — ONDANSETRON HCL 4 MG/2ML IJ SOLN: 4.0000 mg | INTRAMUSCULAR | Status: DC | PRN

## 2015-01-31 MED ORDER — ZOLPIDEM TARTRATE 5 MG PO TABS: 5.0000 mg | ORAL_TABLET | Freq: Every evening | ORAL | Status: DC | PRN

## 2015-01-31 MED ORDER — SIMETHICONE 80 MG PO CHEW: 80.0000 mg | CHEWABLE_TABLET | ORAL | Status: DC | PRN

## 2015-01-31 MED ORDER — OXYCODONE-ACETAMINOPHEN 5-325 MG PO TABS: 2.0000 | ORAL_TABLET | ORAL | Status: DC | PRN

## 2015-01-31 MED ORDER — OXYCODONE-ACETAMINOPHEN 5-325 MG PO TABS
1.0000 | ORAL_TABLET | ORAL | Status: DC | PRN
  Administered 2015-01-31: 1 via ORAL
  Filled 2015-01-31: qty 1

## 2015-01-31 MED ORDER — IBUPROFEN 600 MG PO TABS
600.0000 mg | ORAL_TABLET | Freq: Four times a day (QID) | ORAL | Status: DC
  Administered 2015-01-31 – 2015-02-01 (×6): 600 mg via ORAL
  Filled 2015-01-31 (×6): qty 1

## 2015-01-31 MED ORDER — WITCH HAZEL-GLYCERIN EX PADS: 1.0000 "application " | MEDICATED_PAD | CUTANEOUS | Status: DC | PRN

## 2015-01-31 NOTE — Anesthesia Postprocedure Evaluation (Signed)
  Anesthesia Post-op Note  Patient: Tara [name]  Procedure(s) Performed: * No procedures listed *  Patient Location: Mother/Baby  Anesthesia Type:Epidural  Level of Consciousness: awake, alert  and oriented  Airway and Oxygen Therapy: Patient Spontanous Breathing  Post-op Pain: none  Post-op Assessment: Post-op Vital signs reviewed, Patient's Cardiovascular Status Stable, Respiratory Function Stable, Patent Airway, No signs of Nausea or vomiting, Adequate PO intake, Pain level controlled and No headache              Post-op Vital Signs: Reviewed and stable  Last Vitals:  Filed Vitals:   01/31/15 0900  BP: 129/79  Pulse: 81  Temp:   Resp:     Complications: No apparent anesthesia complications

## 2015-01-31 NOTE — Progress Notes (Signed)
UR chart review completed.  

## 2015-01-31 NOTE — Lactation Note (Signed)
This note was copied from the chart of Tara [name]. Lactation Consultation Note  Patient Name: Tara [name] [id number]'s [name]: 01/31/2015 Reason for consult: Initial assessment  Visited with Mom for initial visit, baby 8 hrs old.  Teaching with Spanish Interpreter done about benefits of exclusive breast feeding.  Baby has been fed 2 bottles so far.  Mom concerned as she is a single mother, and will be returning to work after 2 months.  Mom has WIC so explained about pump loaners and how the program works.  Pediatrician in to examine baby, and he started showing feeding cues.  Offered help with positioning and latching, and Mom accepted.  Baby positioned in football hold.  Taught Mom how to manually express colostrum, with explanation about benefits of this.  Baby latched on easily, and swallowing heard.  Mom complained about uterine cramping, so teaching done.  Answered Mom's questions.  If formula used, to use 5-10 ml, but encouraged skin to skin, and feeding on cue. Brochure left with Mom, along with information on resources in the community.  Encouraged Mom to call her RN for assistance with feedings.  To follow-up in am or prn if needed.    Consult Status Consult Status: Follow-up [name]: 02/01/15 Follow-up type: In-patient    Tara [name], Tara [name] E 01/31/2015, 11:45 AM

## 2015-02-01 ENCOUNTER — Ambulatory Visit: Payer: Self-pay

## 2015-02-01 MED ORDER — INFLUENZA VAC SPLIT QUAD 0.5 ML IM SUSY: 0.5000 mL | PREFILLED_SYRINGE | INTRAMUSCULAR | Status: DC

## 2015-02-01 MED ORDER — INFLUENZA VAC SPLIT QUAD 0.5 ML IM SUSY
0.5000 mL | PREFILLED_SYRINGE | INTRAMUSCULAR | Status: AC
Start: 2015-02-01 — End: 2015-02-01
  Administered 2015-02-01: 0.5 mL via INTRAMUSCULAR
  Filled 2015-02-01: qty 0.5

## 2015-02-01 NOTE — Discharge Instructions (Signed)
Discharge instructions ° °· You can wash your hair °· Shower °· Eat what you want °· Drink what you want °· See me in 6 weeks °· Your ankles are going to swell more in the next 2 weeks than when pregnant °· No sex for 6 weeks ° ° °Aliayah Tyer A, MD 02/01/2015 ° ° °

## 2015-02-01 NOTE — Progress Notes (Signed)
Patient ID: Tara [name], female   DOB: 06/15/1980, 34 y.o.   MRN: [id number] Post partum day 1 Blood pressure 116/69 respiration 18 pulse 85 afebrile Fundus firm Lochia moderate Legs negative wants early discharge today

## 2015-02-01 NOTE — Discharge Summary (Signed)
Obstetric Discharge Summary Reason for Admission: induction of labor Prenatal Procedures: none Intrapartum Procedures: spontaneous vaginal delivery Postpartum Procedures: none Complications-Operative and Postpartum: none HEMOGLOBIN  Date Value Ref Range Status  01/31/2015 11.1* 12.0 - 15.0 g/dL Final   HCT  Date Value Ref Range Status  01/31/2015 33.4* 36.0 - 46.0 % Final    Physical Exam:  General: alert Lochia: appropriate Uterine Fundus: firm Incision: healing well DVT Evaluation: No evidence of DVT seen on physical exam.  Discharge Diagnoses: Term Pregnancy-delivered  Discharge Information: Date: 02/01/2015 Activity: pelvic rest Diet: routine Medications: Percocet Condition: improved Instructions: refer to practice specific booklet Discharge to: home Follow-up Information    Follow up with Kyndall Amero A, MD In 6 weeks.   Specialty:  Obstetrics and Gynecology   Contact information:   [address] [place] [id number] [phone]       Newborn Data: Live born female  Birth Weight: 8 lb 1.6 oz (3675 g) APGAR: 8, 9  Home with mother.  Alanya Vukelich A 02/01/2015, 6:42 AM

## 2015-02-01 NOTE — Progress Notes (Signed)
Discharge paperwork and teaching done with interpreter Orlan LeavensViria Alvarez. Education completed with interpreter also. Encouraged to call out while still here caring for baby with any questions/concerns. Sherald BargeMatthews, Gustave Lindeman L

## 2015-02-01 NOTE — Lactation Note (Signed)
This note was copied from the chart of Tara [name]. Lactation Consultation Note  Patient Name: Tara [name] [id number]'s Date: 02/01/2015  Partially bf mom that states she does not have any milk. She has tried manual expression and only gets drops. Talked about baby belly size and milk transition. Mom states that baby eats from both breast for 10 minutes each then gives formula. The baby is on phototherapy and crying. She reports just feeding baby and he just needs to go back to sleep. She declined latch help at this time. She will call for bf help as needed.     Maternal Data    Feeding Feeding Type: Breast Fed  LATCH Score/Interventions Latch: Grasps breast easily, tongue down, lips flanged, rhythmical sucking.  Audible Swallowing: A few with stimulation  Type of Nipple: Everted at rest and after stimulation  Comfort (Breast/Nipple): Soft / non-tender     Hold (Positioning): Assistance needed to correctly position infant at breast and maintain latch.  LATCH Score: 8  Lactation Tools Discussed/Used     Consult Status      Tara [name] 02/01/2015, 5:57 PM

## 2015-02-02 ENCOUNTER — Ambulatory Visit: Payer: Self-pay

## 2015-02-02 ENCOUNTER — Other Ambulatory Visit (HOSPITAL_COMMUNITY): Payer: Self-pay

## 2015-02-02 NOTE — Lactation Note (Signed)
This note was copied from the chart of Tara [name]. Lactation Consultation Note  Patient Name: Tara [name] ZOXWR'UToday's Date: 02/02/2015 Reason for consult: Follow-up assessment   Follow-up consult at 60 hours; GA 39.4; BW 8 lbs, 1.6 oz; Weight Loss 2%.  Infant on double photo therapy.  Baby Patient; staying the night.   Spoke with mom using interpreter.  Mom requested DEBP to be set up.  RN brought DEBP and HP to room as LC was coming in with interpreter.   Infant has breastfed attempt x1 (5 min) + formula x9 (30-50 ml) in past 24 hours; voids-4 in 24 hours/ 9 life; stools-3 in 24 hours/ 8 life.  LS-8 by RN.   Mom stated she plans to breastfeed for a couple months until she returns to work.  Has WIC.  LC [facility] to [facility].  Discussed pumping/breastfeeding options and returning to work.  Encouraged mom to latch infant in hospital for feedings and then post-supplementation with EBM or formula if needed.   [facility] taught mom how to use Preemie setting and hands-on pumping method.  During consult, mom was beginning to collect milk.  Instructed mom to use preemie setting until she got 20 ml for 3 consecutive pumpings in a row and then switch to standard setting.  Encouraged mom to have RN show her standard setting at that time.   [facility] taught mom how to use HP.  Reviewed engorgement prevention.   Encouraged mom to call for questions or assistance with latching as needed.     Maternal Data    Feeding    LATCH Score/Interventions       Type of Nipple: Everted at rest and after stimulation  Comfort (Breast/Nipple): Soft / non-tender           Lactation Tools Discussed/Used Pump Review: Setup, frequency, and cleaning;Milk Storage Initiated by:: Burna SisS. Kirrah Mustin, RN, IBCLC Date initiated:: 02/02/15   Consult Status Consult Status: Follow-up Date: 02/03/15 Follow-up type: In-patient    Lendon KaVann, Shauntea Lok Walker 02/02/2015,  4:23 PM

## 2015-02-03 ENCOUNTER — Ambulatory Visit: Payer: Self-pay

## 2015-02-03 NOTE — Lactation Note (Signed)
This note was copied from the chart of Boy Avrey Ortiz-Rodriguez. Lactation Consultation Note  Patient Name: Boy Michela PitcherRosalba Ortiz-Rodriguez ZOXWR'UToday's Date: 02/03/2015 Reason for consult: Follow-up assessment;Hyperbilirubinemia   Follow up with 82 hour old infant. Spoke with mother with Hospital interpreter, LindaMarta.  This is her 2nd child, she reports she did not nurse her first infant. Infant with Phototherapy D/C this AM. TSB @0555  9.7, TSB @ 8.4. Infant with 6 BF for 15-30 minutes, 5 bottle feedings of 20-45 cc (2 were EBM of 30 and 35 cc), 9 voids and 9 stools. Mom reports that she has pumped 5 times in the last 24 hours. Infant with 2 % weight loss. Mom reports she is having some pain with latch that improves with feeding. Discussed supply and demand with mom and need to put infant to breast 8-12 x in 24 hours with supplement afterwards and that supplement should decrease with her milk coming in.  Encouraged her to use her milk as supplementation as available. Mom reports she is feeling full today, her breast are firm to touch. Mom with large breasts with intact everted nipples. Gave mom ice packs for 10-20 minutes, then encouraged mom to pump. Mom was pumping and milk was flowing easily left > right. Infant was currently asleep in crib. Encouraged mom to feed infant 8-12 x in 24 hours and to awaken baby as needed while at breast and to massage/compress breast during feeding to maximize milk transfer.  BF information reviewed in Taking Care of Baby and Me Booklet, Reviewed engorgement prevention/treatment. [place] [facility], she will call on Monday to make an appointment. Baby has Pediatrician appointment Monday for follow up. Discussed pump rental with mom, she decided to take home a hand pump vs DEBP. Mom reported she had no further questions.   Maternal Data Formula Feeding for Exclusion: No Reason for exclusion: Mother's choice to formula and breast feed on admission Does the  patient have breastfeeding experience prior to this delivery?: No  Feeding    LATCH Score/Interventions                      Lactation Tools Discussed/Used WIC Program: Yes [facility] Pump Review: Setup, frequency, and cleaning;Milk Storage   Consult Status Consult Status: PRN Follow-up type: Call as needed    Ed BlalockSharon S Paton Crum 02/03/2015, 2:45 PM

## 2015-04-15 NOTE — L&D Delivery Note (Addendum)
Delivery Note At  a viable female was delivered via successful VBAC  (presentation: ROA ;  ).  APGAR: 8/9 , ; weight 3589g   .   Placenta status: delivered intact.  Cord: 3 vessel with the following complications: delivered through nuchal cord x1.    Anesthesia:  epidural Episiotomy:  n/a Lacerations:  2nd degree Suture Repair: 3.0 vicryl Est. Blood Loss (mL):   150cc  Mom to postpartum.  Baby to Couplet care / Skin to Skin.  Clearance Cootsndrew Tyson 03/25/2016, 3:44 PM   OB FELLOW DELIVERY ATTESTATION  I was gloved and present for the delivery in its entirety, and I agree with the above resident's note.    Jen MowElizabeth Tamecca Artiga, DO [place] Fellow 03/25/16  4:30 PM

## 2015-09-27 ENCOUNTER — Encounter (HOSPITAL_COMMUNITY): Payer: Self-pay | Admitting: *Deleted

## 2015-09-27 ENCOUNTER — Inpatient Hospital Stay (HOSPITAL_COMMUNITY)
Admission: AD | Admit: 2015-09-27 | Discharge: 2015-09-27 | Disposition: A | Discharge status: Home / Self Care | Payer: Medicaid Other | Source: Ambulatory Visit | Attending: Obstetrics & Gynecology | Admitting: Obstetrics & Gynecology

## 2015-09-27 DIAGNOSIS — R21 Rash and other nonspecific skin eruption: Secondary | ICD-10-CM | POA: Insufficient documentation

## 2015-09-27 DIAGNOSIS — Z3A12 12 weeks gestation of pregnancy: Secondary | ICD-10-CM | POA: Insufficient documentation

## 2015-09-27 DIAGNOSIS — Z3201 Encounter for pregnancy test, result positive: Secondary | ICD-10-CM

## 2015-09-27 DIAGNOSIS — O26891 Other specified pregnancy related conditions, first trimester: Secondary | ICD-10-CM | POA: Insufficient documentation

## 2015-09-27 DIAGNOSIS — L282 Other prurigo: Secondary | ICD-10-CM

## 2015-09-27 LAB — POCT PREGNANCY, URINE: Preg Test, Ur: POSITIVE — AB

## 2015-09-27 MED ORDER — HYDROXYZINE HCL 25 MG PO TABS: 25.0000 mg | ORAL_TABLET | Freq: Four times a day (QID) | ORAL | Status: DC

## 2015-09-27 MED ORDER — HYDROCORTISONE 1 % EX CREA: TOPICAL_CREAM | CUTANEOUS | Status: DC

## 2015-09-27 NOTE — Discharge Instructions (Signed)
Alergias (Allergies) La alergia ocurre cuando el cuerpo reacciona a una sustancia de un modo que no es normal. Una reaccin alrgica puede producirse despus de cualquiera de estas acciones:  Comer algo.  Inhalar algo.  Tocar algo. CULES SON LAS CLASES DE ALERGIAS? Se puede ser alrgico a lo siguiente:  Cosas que surgen solo durante ciertas temporadas, como moho y [place].  Alimentos.  Medicamentos.  Insectos.  Caspa de los Lupus. CULES SON LOS SNTOMAS DE LAS ALERGIAS?  Hinchazn. Puede aparecer en los labios, la cara, la lengua, la boca o la garganta.  Estornudos.  Tos.  Respiracin ruidosa (sibilancias).  Nariz tapada.  Hormigueo en la boca.  Una erupcin.  Picazn.  Zonas de piel hinchadas, rojas y que producen picazn (ronchas).  Lagrimeo.  Vmitos.  Deposiciones lquidas (diarrea).  Mareos.  Desmayo o sensacin de desvanecimiento.  Problemas para respirar o tragar.  Sensacin de opresin en el pecho.  Latidos cardacos acelerados. CMO SE DIAGNOSTICAN LAS ALERGIAS? Las alergias pueden diagnosticarse mediante lo siguiente:  Antecedentes mdicos y familiares.  Pruebas cutneas.  Anlisis de [place].  Un registro de alimentos. Un registro de [employer], las bebidas y los sntomas diarios.  Los resultados de una dieta de eliminacin. Esta dieta implica asegurarse de no comer determinados alimentos y [employer] ver qu sucede cuando comienza a comerlos de nuevo. CMO SE TRATAN LAS ALERGIAS? No hay una cura para las [place], pero las reacciones alrgicas pueden tratarse con medicamentos. Generalmente, las reacciones graves deben tratarse en un hospital.  CMO PUEDEN PREVENIRSE LAS REACCIONES? La mejor manera de prevenir una reaccin alrgica es evitar el elemento que le causa Programmer, multimedia. Las vacunas y los medicamentos para la alergia tambin pueden ayudar a prevenir las reacciones en [employer].   Esta informacin  no tiene Theme park manager el consejo del mdico. Asegrese de hacerle al mdico cualquier pregunta que tenga.   Document Released: 12/01/2012 Document Revised: 04/21/2014 Elsevier Interactive Patient Education 2016 [employer].  [employer] (Drug Rash) El exantema medicamentoso es un cambio en el color o la textura de la piel causado por un medicamento. Puede aparecer minutos, horas o das despus de que la persona toma el [place]. CAUSAS Generalmente, esta afeccin se debe a Acupuncturist. La causa tambin puede ser la exposicin a la luz del sol despus de haber tomado un medicamento que hace que la piel se vuelva sensible a la luz. Los medicamentos que suelen causar exantemas incluyen los siguientes:  Penicilina.  Antibiticos.  Medicamentos para tratar las convulsiones.  Medicamentos para tratar Management consultant (quimioterapia).  Aspirina y otros antiinflamatorios no esteroides (AINE).  Sustancias de contraste inyectables que contienen iodo.  Insulina. SNTOMAS Los sntomas de esta afeccin incluyen lo siguiente:  Enrojecimiento.  Protuberancias pequeas.  Descamacin.  Picazn.  Ronchas que causan picazn (urticaria).  Hinchazn. El exantema puede aparecer en pequea zona de la piel o en todo el cuerpo. DIAGNSTICO Para diagnosticar la afeccin, el mdico realizar un examen fsico. Adems, puede pedir anlisis para averiguar cul es el medicamento que caus el [place]. Las pruebas para determinar la causa de un exantema incluyen lo siguiente:  Pruebas cutneas.  Anlisis de [place].  Prueba de exposicin a medicamentos. Para esta prueba, se dejan de tomar todos los medicamentos que no se necesitan para luego comenzar a tomarlos nuevamente incorporando uno a Licensed conveyancer. TRATAMIENTO Un exantema medicamentoso puede tratarse con medicamentos, entre ellos:  Antihistamnicos. Se pueden administrar para Associate Professor.  Un antiinflamatorio  no esteroide Murriel Hopper(AINE). Se puede administrar para reducir la hinchazn y Corporate treasurertratar el dolor.  Un corticoide. Se puede administrar para reducir la hinchazn. Generalmente, el exantema desaparece cuando la persona deja de tomar el medicamento que lo caus. INSTRUCCIONES PARA EL CUIDADO EN EL HOGAR  Tome los medicamentos solamente como se lo haya indicado el mdico.  Informe a todos los mdicos sobre cualquier reaccin a un medicamento que haya tenido en el pasado.  Si tiene ronchas, tome una ducha fra o pngase una compresa fra para Associate Professoraliviar la picazn. SOLICITE ATENCIN MDICA SI:  Lance Mussiene fiebre.  El exantema no desaparece.  El [place]exantema empeora.  El exantema regresa.  Tiene sibilancias o tos. SOLICITE ATENCIN MDICA DE INMEDIATO SI:  Empieza a tener problemas respiratorios.  Comienza a faltarle el aire.  Se le empiezan a hinchar la cara o la garganta.  Tiene mucha debilidad acompaada de mareos o [place]may[place].  Siente dolor en el pecho.   Esta informacin no tiene Theme park managercomo fin reemplazar el consejo del mdico. Asegrese de hacerle al mdico cualquier pregunta que tenga.   Document Released: 03/31/2005 Document Revised: 04/21/2014 Elsevier Interactive Patient Education [employer]2016 Elsevier Inc.

## 2015-09-27 NOTE — MAU Provider Note (Signed)
History     CSN: [id number]  Arrival date and time: 09/27/15 1223   None     Chief Complaint  Patient presents with  . Rash  . Pruritis   HPI   [name] is a 35 y.o. female [phone] at 5422w6d here with a rash. She first notice the rash after sitting in the grass outside 2 days ago, she noticed the itching after eating jello.  She is unsure if the two are related to the rash. The rash is on her legs and on her arms, the rash is red and itchy.  Nobody in the house has the same symptoms.  She denies fever.   She has no pregnancy concerns at this time.     OB History    Gravida Para Term Preterm AB TAB SAB Ectopic Multiple Living   4 2 2  0 1 1 0 0 0 2      Past Medical History  Diagnosis Date  . Hypertension     Past Surgical History  Procedure Laterality Date  . Cesarean section      Family History  Problem Relation Age of Onset  . Diabetes Mother   . Hypertension Mother     Social History  Substance Use Topics  . Smoking status: Never Smoker   . Smokeless tobacco: Never Used  . Alcohol Use: No    Allergies: No Known Allergies  Prescriptions prior to admission  Medication Sig Dispense Refill Last Dose  . acetaminophen (TYLENOL) 325 MG tablet Take 650 mg by mouth every 6 (six) hours as needed for moderate pain or headache.   09/13/2015   Results for orders placed or performed during the hospital encounter of 09/27/15 (from the past 48 hour(s))  Pregnancy, urine POC     Status: Abnormal   Collection Time: 09/27/15  1:31 PM  Result Value Ref Range   Preg Test, Ur POSITIVE (A) NEGATIVE    Comment:        THE SENSITIVITY OF THIS METHODOLOGY IS >24 mIU/mL    Recent Results (from the past 2160 hour(s))  Pregnancy, urine POC     Status: Abnormal   Collection Time: 09/27/15  1:31 PM  Result Value Ref Range   Preg Test, Ur POSITIVE (A) NEGATIVE    Comment:        THE SENSITIVITY OF THIS METHODOLOGY IS >24 mIU/mL     Review of Systems    Constitutional: Negative for fever and chills.  Gastrointestinal: Negative for nausea, vomiting and abdominal pain.  Skin: Positive for itching and rash.   Physical Exam   Blood pressure 133/70, pulse 104, temperature 97.9 F (36.6 C), temperature source Oral, resp. rate 18, last menstrual period 06/29/2015, unknown if currently breastfeeding.  Physical Exam  Constitutional: She is oriented to person, place, and time. She appears well-developed and well-nourished. No distress.  HENT:  Head: Normocephalic.  Eyes: Pupils are equal, round, and reactive to light.  Respiratory: Effort normal.  Musculoskeletal: Normal range of motion.  Neurological: She is alert and oriented to person, place, and time.  Skin: Skin is warm. Rash noted. No purpura noted. Rash is papular and urticarial. Rash is not pustular and not vesicular. She is not diaphoretic. No erythema.     Psychiatric: Her behavior is normal.    MAU Course  Procedures  None  MDM  Possibly scabies, however unlikely since no one else in her house with the same symptoms. More likely contact dermatitis.   Assessment and Plan  A:  1. Rash and nonspecific skin eruption   2. Pruritic rash    P:  Discharge home in stable condition Rx: Atarax, hydrocortisone cream Follow up with urgent care of PCP if symptoms due not improve Avoid triggers such as grass; allergie testing may be needed if symptoms continue Return to MAU if symptoms worsen    Duane Lope, NP 10/01/2015 12:12 PM

## 2015-09-27 NOTE — MAU Note (Signed)
Pt states she has had a rash & itching over entire body for 2 days, Has not taking medication for it.  Pos HPT last month.  Denies abd pain or bleeding.

## 2015-09-27 NOTE — MAU Note (Signed)
Pt received to MAU Rm 8 c/o of a upper body rash, including her upper extremities, which started on Tuesday around 1700. Pt states that the rash is itchy but not painful. Denies any pain with the rash. Patient aware of patient's arrival. Carmelina DaneERRI L Kaliegh Willadsen, RN

## 2015-11-07 ENCOUNTER — Encounter (HOSPITAL_COMMUNITY): Payer: Self-pay | Admitting: *Deleted

## 2015-11-07 ENCOUNTER — Inpatient Hospital Stay (HOSPITAL_COMMUNITY)
Admission: AD | Admit: 2015-11-07 | Discharge: 2015-11-07 | Disposition: A | Discharge status: Home / Self Care | Payer: Medicaid Other | Source: Ambulatory Visit | Attending: Obstetrics and Gynecology | Admitting: Obstetrics and Gynecology

## 2015-11-07 ENCOUNTER — Encounter: Payer: Self-pay | Admitting: Advanced Practice Midwife

## 2015-11-07 DIAGNOSIS — O219 Vomiting of pregnancy, unspecified: Secondary | ICD-10-CM

## 2015-11-07 DIAGNOSIS — R42 Dizziness and giddiness: Secondary | ICD-10-CM

## 2015-11-07 DIAGNOSIS — M549 Dorsalgia, unspecified: Secondary | ICD-10-CM | POA: Insufficient documentation

## 2015-11-07 DIAGNOSIS — O26892 Other specified pregnancy related conditions, second trimester: Secondary | ICD-10-CM | POA: Insufficient documentation

## 2015-11-07 DIAGNOSIS — Z3A19 19 weeks gestation of pregnancy: Secondary | ICD-10-CM

## 2015-11-07 DIAGNOSIS — O162 Unspecified maternal hypertension, second trimester: Secondary | ICD-10-CM | POA: Insufficient documentation

## 2015-11-07 DIAGNOSIS — O99891 Other specified diseases and conditions complicating pregnancy: Secondary | ICD-10-CM

## 2015-11-07 DIAGNOSIS — O9989 Other specified diseases and conditions complicating pregnancy, childbirth and the puerperium: Secondary | ICD-10-CM

## 2015-11-07 LAB — CBC
HCT: 34.4 % — ABNORMAL LOW (ref 36.0–46.0)
HEMOGLOBIN: 11.8 g/dL — AB (ref 12.0–15.0)
MCH: 30.6 pg (ref 26.0–34.0)
MCHC: 34.3 g/dL (ref 30.0–36.0)
MCV: 89.4 fL (ref 78.0–100.0)
Platelets: 302 10*3/uL (ref 150–400)
RBC: 3.85 MIL/uL — AB (ref 3.87–5.11)
RDW: 13.9 % (ref 11.5–15.5)
WBC: 9.8 10*3/uL (ref 4.0–10.5)

## 2015-11-07 LAB — URINALYSIS, ROUTINE W REFLEX MICROSCOPIC
BILIRUBIN URINE: NEGATIVE
GLUCOSE, UA: NEGATIVE mg/dL
HGB URINE DIPSTICK: NEGATIVE
Ketones, ur: NEGATIVE mg/dL
Leukocytes, UA: NEGATIVE
Nitrite: NEGATIVE
PROTEIN: 30 mg/dL — AB
Specific Gravity, Urine: 1.025 (ref 1.005–1.030)
pH: 6 (ref 5.0–8.0)

## 2015-11-07 LAB — COMPREHENSIVE METABOLIC PANEL
ALK PHOS: 82 U/L (ref 38–126)
ALT: 41 U/L (ref 14–54)
AST: 39 U/L (ref 15–41)
Albumin: 3.3 g/dL — ABNORMAL LOW (ref 3.5–5.0)
Anion gap: 8 (ref 5–15)
BUN: 7 mg/dL (ref 6–20)
CALCIUM: 8.7 mg/dL — AB (ref 8.9–10.3)
CO2: 21 mmol/L — AB (ref 22–32)
CREATININE: 0.41 mg/dL — AB (ref 0.44–1.00)
Chloride: 104 mmol/L (ref 101–111)
Glucose, Bld: 92 mg/dL (ref 65–99)
Potassium: 3.6 mmol/L (ref 3.5–5.1)
SODIUM: 133 mmol/L — AB (ref 135–145)
Total Bilirubin: 0.2 mg/dL — ABNORMAL LOW (ref 0.3–1.2)
Total Protein: 7.1 g/dL (ref 6.5–8.1)

## 2015-11-07 LAB — URINE MICROSCOPIC-ADD ON: RBC / HPF: NONE SEEN RBC/hpf (ref 0–5)

## 2015-11-07 MED ORDER — PROMETHAZINE HCL 25 MG PO TABS: 12.5000 mg | ORAL_TABLET | Freq: Four times a day (QID) | ORAL | 2 refills | Status: DC | PRN

## 2015-11-07 NOTE — Discharge Instructions (Signed)
Nuseas matinales (Morning Sickness) Se denominan nuseas matinales a las ganas de vomitar (nuseas) durante el Psychiatrist. Esta sensacin puede estar acompaada o no de vmitos. Aparecen por la maana, pero puede ser un problema a lo largo de [employer]. Las [employer] son ms frecuentes Physicist, medical trimestre, Biomedical engineer pueden continuar durante todo el [place]. Aunque son molestas, generalmente no causan ningn dao, excepto que presente vmitos continuos e intensos (hiperemesis gravdica). Este problema requiere un tratamiento ms intenso.  CAUSAS  La causa de las nuseas matinales no se conoce completamente pero estas parecen estar relacionadas con los cambios hormonales normales que ocurren durante el [place]. FACTORES DE RIESGO Usted tendr mayor riesgo si:  Tena nuseas o vmitos antes de quedar embarazada.  Tuvo nuseas matinales durante los embarazos previos.  Est embarazada de ms de un beb, por ejemplo mellizos. TRATAMIENTO  No utilice ningn medicamento (recetado, de venta libre ni herbario) para este problema sin consultar con su mdico. El mdico tambin podr Camera operator o recomendar:  Suplementos de vitamina B6.  Medicamentos para las nauseas  La medicina herbal llamada jengibre. INSTRUCCIONES PARA EL CUIDADO EN EL HOGAR   Tome solo medicamentos de venta libre o recetados, segn las indicaciones del mdico.  Tomar un multivitamnico antes de Scientist, research (physical sciences) puede prevenir o disminuir la gravedad de las nuseas matinales en la mayora de las mujeres.  Coma un trozo de [place] seca o galletas sin sal antes de levantarse de la cama por la maana.  Coma 5 o 6 comidas pequeas por da.  Consuma alimentos blandos y secos (arroz, papas asadas). Los alimentos ricos en carbohidratos generalmente ayudan.  No beba lquidos con las comidas. Tome lquidos [employer].  Evite los alimentos muy grasos y condimentados.  Pdale a otra persona que cocine para usted  si [employer] de algn alimento le provoca nuseas o vmitos.  Si tiene ganas de vomitar despus de tomar las vitaminas prenatales, tmelas a la noche o con una colacin.  Tome colaciones de alimentos proteicos (frutos secos, yogur, queso) entre comidas si siente apetito.  Coma gelatina sin azcar de postre.  Una pulsera de acupresin (que se utiliza para Research scientist (life sciences) en viajes) puede ser de [place].  La acupuntura puede ayudarla.  No fume.  Consiga un humidificador para Customer service manager de su casa libre de [place].  Trate de respirar aire fresco. SOLICITE ATENCIN MDICA SI:   Los remedios caseros no funcionan y Media planner.  Se siente mareada o sufre un desmayo.  Pierde peso. SOLICITE ATENCIN MDICA DE INMEDIATO SI:   Tiene nuseas y vmitos de manera persistente y no puede controlarlos.  Pierde el conocimiento (se desmaya). ASEGRESE DE QUE:  Comprende estas instrucciones.  Controlar su afeccin.  Recibir ayuda de inmediato si no mejora o si empeora.   Esta informacin no tiene Theme park manager el consejo del mdico. Asegrese de hacerle al mdico cualquier pregunta que tenga.   Document Released: 07/17/2008 Document Revised: 04/05/2013 Elsevier Interactive Patient Education 2016 [employer]. Dolor de espalda durante el embarazo  (Back Pain in Pregnancy)  El dolor de espalda es habitual durante el [place]. Ocurre en aproximadamente la mitad de todos los [place]. Es importante para usted y su beb que permanezca activa durante el [place].Si siente que Chief Technology Officer de espalda es lo que no le permite mantenerse activa o dormir bien, Scientist, clinical (histocompatibility and immunogenetics) a su mdico. La causa del dolor de espalda puede deberse a varios factores relacionados con los cambios durante el [place].Afortunadamente,  excepto que haya tenido problemas de espalda antes del [place], es probable que el dolor mejore despus del [place]. El dolor lumbar por lo general ocurre entre el quinto y sptimo  mes del Psychiatrist. Sin embargo, puede ocurrir [employer] primeros meses. Otros factores que aumentan el riesgo son:   Problemas previos en la espalda.  Lesiones en la espalda.  Tener gemelos o embarazos mltiples.  Tos persistente.  El estrs.  Movimientos repetitivos relacionados con Kathie Dike.  Enfermedad muscular o de la columna vertebral en la espalda.  Antecedentes familiares de problemas de espalda, rotura (hernia) de discos u osteoporosis.  Depresin, ansiedad y crisis de [place]. CAUSAS   En las embarazadas, el cuerpo produce una hormona llamada relaxina. Esta hormona hace que los ligamentos que conectan la zona lumbar y los huesos del pubis sean ms flexibles. Esta flexibilidad permite que el beb nazca con ms facilidad. Cuando los ligamentos estn relajados, los msculos tienen que trabajar ms para apoyar la espalda. El dolor en la espalda puede deberse al cansancio muscular. El dolor tambin puede tener su causa en la irritacin de los tejidos de a espalda que se irritan ya que estn recibiendo menos apoyo.  A medida que el beb crece, ejerce presin [employer] nervios y los vasos sanguneos de la pelvis. Esto causa dolor de espalda.  A medida que el beb crece y [address] durante el [place], el tero presiona los msculos del estmago hacia adelante y [place] su centro de gravedad. Esto hace que los msculos de la espalda deban trabajar ms para mantener una buena [place]. SNTOMAS  Dolor lumbar durante el embarazo Generalmente se produce en la zona o por arriba de la cintura en el centro de la espalda. Puede haber dolor y entumecimiento que se irradia hacia la pierna o el pie. Es similar al dolor de espalda baja experimentada por las mujeres no embarazadas. Por lo general, aumenta al [employer] de pie o sentada por largos perodos de [place], o con levantamientos repetitivos Tambin puede haber sensibilidad en los msculos en la zona superior de la espalda .  Dolor  plvico posterior Environmental consultant en la parte posterior de la pelvis es ms frecuente que el dolor lumbar en el embarazo. Se trata de un dolor profundo que se siente a un lado en la cintura, o a travs del cxis (sacro), o en ambos lugares. Puede sentir dolor en uno o ambos lados Este dolor tambin puede sentirse en las nalgas y el dorso de los muslos Tambin puede haber dolor pbico y en la ingle. El dolor no se mejora rpidamente con el reposo, y [place] puede haber rigidez matutina. Muchas actividades pueden causarlo. Un buen estado fsico antes y [address] puede o no prevenir este problema. Las contracciones del parto suelen aparecer cada 1 a 2 minutos, tienen una duracin de aproximadamente 1 minuto, e implica una sensacin de empujar o presin en la pelvis. Sin embargo, si usted est a trmino con Firefighter, Chief Technology Officer constante en la zona lumbar puede indicar el comienzo de un parto prematuro, y usted debe ser consciente de ello.  DIAGNSTICO  No se deben tomar radiografas de la [place] [employer] las primeras 12 a 14 semanas del embarazo y durante el resto del Psychiatrist, slo cuando sea absolutamente necesario. La resonancia magntica no emite radiacin y es un estudio seguro durante el Psychiatrist. Pero tambin se deben hacer solamente cuando sea absolutamente necesario.  INSTRUCCIONES PARA EL CUIDADO EN EL HOGAR  Realice actividad fsica segn las indicaciones del mdico. El ejercicio es la manera ms eficaz para prevenir o tratar Chief Technology Officer de espalda. Si tiene un problema en la espalda, es especialmente importante evitar los deportes que requieran de movimientos corporales rpidos. La natacin y las caminatas son las mejores [address].  No permanezca sentada o de pie en el mismo lugar durante largos perodos.  No use tacos altos.  Sintese en la silla con una buena postura. Use una almohada en su espalda baja si es necesario. Asegrese de que su cabeza descansa sobre sus  hombros y no est colgando hacia delante.  Trate de dormir de lado, de preferencia el lado izquierdo, con una o [employer] piernas. Si est dolorida despus de una noche de descanso, la cama puede ser [employer].Trate de colocar una tabla entre el colchn y el somier.  Prstele atencin a su cuerpo cuando se levante.Si siente dolor,pida ayuda o trate de doblar las rodillas ms para [employer] de las piernas en lugar de los msculos de la espalda. Pngase en cuclillas al levantar algo del suelo. No se doble.  Consuma una dieta saludable. Trate de aumentar de peso dentro de las recomendaciones de su mdico.  Utilice compresas de calor o fro de 3 a 4 veces al da durante 15 minutos para Primary school teacher.  Solo tome medicamentos que se pueden comprar sin receta o recetados para Chief Technology Officer, Dentist o fiebre, como le indica el mdico. Dolor de espaldas repentino (agudo).  Haga reposo en cama slo en caso de los episodios ms extremos y agudos de Engineer, mining. El reposo prolongado en cama de ms de 48 horas agravar su trastorno.  El hielo es muy efectivo en los problemas agudos.  Ponga el hielo en una bolsa plstica.  Colquese una toalla entre la piel y la bolsa de hielo.  Deje el hielo durante 10 a 20 minutos cada 2 horas o segn lo nesecite, mientras se encuentre despierta.  Las compresas de calor durante 30 minutos antes de las actividades tambin puede ayudar. Dolor crnico en la espalda. Consulte a su mdico si el dolor es continuo. El mdico podr ayudarla o derivarla para que realice los ejercicios y trabajos de fortalecimiento apropiados. Con un buen entrenamiento fsico, podr evitar la mayor parte de los [place]. En algunos casos, la causa es un problema ms grave. Debe ser controlada inmediatamente si aparecen nuevos problemas. El mdico tambin podr recomendar:   Una faja de maternidad.  Un arns elstico.  Un cors para la espalda.  Un masajista o  acupuntura. SOLICITE ATENCIN MDICA SI:   No puede Careers information officer de sus actividades diarias, an tomando los medicamentos para Psychologist, occupational.  Beverlee Nims ser derivada a un fisioterapeuta o quiroprxico.  Beverlee Nims intentar con acupuntura. SOLICITE ATENCIN MDICA DE INMEDIATO SI:   Siente entumecimiento, hormigueo, debilidad o problemas con el uso de los brazos o las piernas.  Siente un dolor de espalda muy intenso que no se alivia con medicamentos.  Tiene modificaciones repentinas en el control de la vejiga o el intestino.  Aumenta el dolor en otras partes del cuerpo.  Siente que le falta el aire, se marea o sufre un [place].  Tiene nuseas, vmitos o sudoracin.  Siente un dolor en la espalda similar al del [place].  Cuando aparece [employer], rompe la bolsa de aguas o tiene un sangrado vaginal.  El dolor o el adormecimiento se extienden Engineer, water  pierna.  El dolor aparece despus de una cada.  Siente dolor de un solo lado. Podra tener clculos renales.  Observa sangre en la orina. Podra tener una infeccin en la vejiga o clculos renales.  Siente dolor y aparecen ronchas. Podra tener culebrilla. El dolor de espalda es bastante frecuente durante el embarazo pero no debe aceptarse slo como parte del [place]. Siempre debe tratarse lo ms rpidamente posible. Har que su embarazo sea lo ms placentero posible.    Esta informacin no tiene Theme park manager el consejo del mdico. Asegrese de hacerle al mdico cualquier pregunta que tenga.   Document Released: 12/11/2010 Document Revised: 06/23/2011 Elsevier Interactive Patient Education [employer]

## 2015-11-07 NOTE — MAU Provider Note (Signed)
Chief Complaint: Abdominal Pain and Nausea   First Provider Initiated Contact with Patient 11/07/15 1254      SUBJECTIVE HPI: Tara [name] is a 35 y.o. [id number] at [redacted]w[redacted]d by LMP who presents to maternity admissions reporting back pain, nausea, no care yet this pregnancy.  She reports constant back pain x 1-2 weeks and nausea daily with occasional vomiting for 2-3 weeks but worsening this week. She has not tried any treatments.  The pain is the same since onset, the nausea is worsening.  She signed up for [facility] but does not yet have a provider for prenatal care.   She denies vaginal bleeding, vaginal itching/burning, urinary symptoms, h/a, dizziness, n/v, or fever/chills.     HPI  Past Medical History:  Diagnosis Date  . Hypertension    Past Surgical History:  Procedure Laterality Date  . CESAREAN SECTION     Social History   Social History  . Marital status: Single    Spouse name: N/A  . Number of children: N/A  . Years of education: N/A   Occupational History  . Not on file.   Social History Main Topics  . Smoking status: Never Smoker  . Smokeless tobacco: Never Used  . Alcohol use No  . Drug use: No  . Sexual activity: Yes   Other Topics Concern  . Not on file   Social History Narrative  . No narrative on file   No current facility-administered medications on file prior to encounter.    Current Outpatient Prescriptions on File Prior to Encounter  Medication Sig Dispense Refill  . acetaminophen (TYLENOL) 325 MG tablet Take 650 mg by mouth every 6 (six) hours as needed for moderate pain or headache.     No Known Allergies  ROS:  Review of Systems  Constitutional: Negative for chills, fatigue and fever.  Respiratory: Negative for shortness of breath.   Cardiovascular: Negative for chest pain.  Gastrointestinal: Positive for nausea and vomiting.  Genitourinary: Negative for difficulty urinating, dysuria, flank pain, pelvic pain, vaginal  bleeding, vaginal discharge and vaginal pain.  Musculoskeletal: Positive for back pain.  Neurological: Negative for dizziness and headaches.  Psychiatric/Behavioral: Negative.      I have reviewed patient's Past Medical Hx, Surgical Hx, Family Hx, Social Hx, medications and allergies.   Physical Exam  Patient Vitals for the past 24 hrs:  BP Temp Temp src Pulse Resp  11/07/15 1615 121/73 - - 76 -  11/07/15 1108 117/59 98.3 F (36.8 C) Oral 92 18   Constitutional: Well-developed, well-nourished female in no acute distress.  Cardiovascular: normal rate Respiratory: normal effort GI: Abd soft, non-tender. Pos BS x 4 MS: Extremities nontender, no edema, normal ROM Neurologic: Alert and oriented x 4.  GU: Neg CVAT.  PELVIC EXAM: Cervix pink, visually closed, without lesion, scant white creamy discharge, vaginal walls and external genitalia normal Dilation: Closed Effacement (%): Thick Exam by:: Sharen Counter, CNM  FHT 159 by doppler  LAB RESULTS Results for orders placed or performed during the hospital encounter of 11/07/15 (from the past 24 hour(s))  Urinalysis, Routine w reflex microscopic (not at [facility])     Status: Abnormal   Collection Time: 11/07/15 11:00 AM  Result Value Ref Range   Color, Urine YELLOW YELLOW   APPearance CLEAR CLEAR   Specific Gravity, Urine 1.025 1.005 - 1.030   pH 6.0 5.0 - 8.0   Glucose, UA NEGATIVE NEGATIVE mg/dL   Hgb urine dipstick NEGATIVE NEGATIVE   Bilirubin Urine NEGATIVE NEGATIVE  Ketones, ur NEGATIVE NEGATIVE mg/dL   Protein, ur 30 (A) NEGATIVE mg/dL   Nitrite NEGATIVE NEGATIVE   Leukocytes, UA NEGATIVE NEGATIVE  Urine microscopic-add on     Status: Abnormal   Collection Time: 11/07/15 11:00 AM  Result Value Ref Range   Squamous Epithelial / LPF 6-30 (A) NONE SEEN   WBC, UA 0-5 0 - 5 WBC/hpf   RBC / HPF NONE SEEN 0 - 5 RBC/hpf   Bacteria, UA FEW (A) NONE SEEN   Urine-Other MUCOUS PRESENT   CBC     Status: Abnormal    Collection Time: 11/07/15  2:06 PM  Result Value Ref Range   WBC 9.8 4.0 - 10.5 K/uL   RBC 3.85 (L) 3.87 - 5.11 MIL/uL   Hemoglobin 11.8 (L) 12.0 - 15.0 g/dL   HCT 40.9 (L) 81.1 - 91.4 %   MCV 89.4 78.0 - 100.0 fL   MCH 30.6 26.0 - 34.0 pg   MCHC 34.3 30.0 - 36.0 g/dL   RDW 78.2 95.6 - 21.3 %   Platelets 302 150 - 400 K/uL  Comprehensive metabolic panel     Status: Abnormal   Collection Time: 11/07/15  2:06 PM  Result Value Ref Range   Sodium 133 (L) 135 - 145 mmol/L   Potassium 3.6 3.5 - 5.1 mmol/L   Chloride 104 101 - 111 mmol/L   CO2 21 (L) 22 - 32 mmol/L   Glucose, Bld 92 65 - 99 mg/dL   BUN 7 6 - 20 mg/dL   Creatinine, Ser 0.86 (L) 0.44 - 1.00 mg/dL   Calcium 8.7 (L) 8.9 - 10.3 mg/dL   Total Protein 7.1 6.5 - 8.1 g/dL   Albumin 3.3 (L) 3.5 - 5.0 g/dL   AST 39 15 - 41 U/L   ALT 41 14 - 54 U/L   Alkaline Phosphatase 82 38 - 126 U/L   Total Bilirubin 0.2 (L) 0.3 - 1.2 mg/dL   GFR calc non Af Amer >60 >60 mL/min   GFR calc Af Amer >60 >60 mL/min   Anion gap 8 5 - 15    --/--/O POS (10/18 0755)  IMAGING No results found.  MAU Management/MDM: Ordered labs and reviewed results.  No evidence of preterm labor.  Back pain likely musculoskeletal. Will treat n/v with Phenergan 12.5-25 mg PO Q 6 hours, order outpatient anatomy US, and establish care [facility]. Rest/ice/heat/Tylenol for back pain.  Precautions given/reasons to return to MAU. Pt stable at time of discharge.  ASSESSMENT 1. Nausea and vomiting during pregnancy prior to [redacted] weeks gestation   2. Dizziness   3. Back pain complicating pregnancy, second trimester   4. [redacted] weeks gestation of pregnancy     PLAN Discharge home     Medication List    TAKE these medications   acetaminophen 325 MG tablet Commonly known as:  TYLENOL Take 650 mg by mouth every 6 (six) hours as needed for moderate pain or headache.   prenatal multivitamin Tabs tablet Take 1 tablet by mouth daily at 12 noon.   promethazine 25  MG tablet Commonly known as:  PHENERGAN Take 0.5-1 tablets (12.5-25 mg total) by mouth every 6 (six) hours as needed.      Follow-up Information    [facility] for prenatal care .           Sharen Counter Certified Nurse-Midwife 11/07/2015  4:23 PM

## 2015-11-07 NOTE — MAU Note (Signed)
Pt states that for three days she has headache, nausea, and abdominal pain.  Pt states it is like a pulsing pain when she walks.

## 2015-11-13 ENCOUNTER — Other Ambulatory Visit: Payer: Medicaid Other

## 2015-11-13 ENCOUNTER — Encounter (HOSPITAL_COMMUNITY): Payer: Self-pay | Admitting: Advanced Practice Midwife

## 2015-11-21 ENCOUNTER — Encounter: Payer: Medicaid Other | Admitting: Obstetrics and Gynecology

## 2015-11-23 ENCOUNTER — Ambulatory Visit (HOSPITAL_COMMUNITY)
Admission: RE | Admit: 2015-11-23 | Discharge: 2015-11-23 | Disposition: A | Discharge status: Home / Self Care | Payer: Medicaid Other | Source: Ambulatory Visit | Attending: Advanced Practice Midwife | Admitting: Advanced Practice Midwife

## 2015-11-23 DIAGNOSIS — O26892 Other specified pregnancy related conditions, second trimester: Secondary | ICD-10-CM | POA: Insufficient documentation

## 2015-11-23 DIAGNOSIS — O9989 Other specified diseases and conditions complicating pregnancy, childbirth and the puerperium: Secondary | ICD-10-CM

## 2015-11-23 DIAGNOSIS — M549 Dorsalgia, unspecified: Secondary | ICD-10-CM | POA: Insufficient documentation

## 2015-11-23 DIAGNOSIS — Z3A19 19 weeks gestation of pregnancy: Secondary | ICD-10-CM | POA: Insufficient documentation

## 2015-11-27 ENCOUNTER — Encounter: Payer: Medicaid Other | Admitting: Internal Medicine

## 2015-11-27 DIAGNOSIS — Z3490 Encounter for supervision of normal pregnancy, unspecified, unspecified trimester: Secondary | ICD-10-CM | POA: Insufficient documentation

## 2015-12-28 ENCOUNTER — Other Ambulatory Visit: Payer: Medicaid Other

## 2016-01-02 ENCOUNTER — Other Ambulatory Visit (HOSPITAL_COMMUNITY)
Admission: RE | Admit: 2016-01-02 | Discharge: 2016-01-02 | Disposition: A | Discharge status: Home / Self Care | Payer: Medicaid Other | Source: Ambulatory Visit | Attending: Family Medicine | Admitting: Family Medicine

## 2016-01-02 ENCOUNTER — Ambulatory Visit (INDEPENDENT_AMBULATORY_CARE_PROVIDER_SITE_OTHER): Payer: Medicaid Other | Admitting: Family Medicine

## 2016-01-02 VITALS — BP 120/70 | HR 86 | Temp 98.6°F | Wt 192.0 lb

## 2016-01-02 DIAGNOSIS — Z3482 Encounter for supervision of other normal pregnancy, second trimester: Secondary | ICD-10-CM

## 2016-01-02 DIAGNOSIS — Z113 Encounter for screening for infections with a predominantly sexual mode of transmission: Secondary | ICD-10-CM | POA: Insufficient documentation

## 2016-01-02 LAB — POCT URINALYSIS DIPSTICK
BILIRUBIN UA: NEGATIVE
GLUCOSE UA: NEGATIVE
Ketones, UA: NEGATIVE
Nitrite, UA: NEGATIVE
PH UA: 7
Protein, UA: 30
RBC UA: NEGATIVE
SPEC GRAV UA: 1.02
UROBILINOGEN UA: 0.2

## 2016-01-02 LAB — POCT UA - MICROSCOPIC ONLY

## 2016-01-02 LAB — GLUCOSE, CAPILLARY
Comment 1: 1
Glucose-Capillary: 136 mg/dL — ABNORMAL HIGH (ref 65–99)

## 2016-01-02 MED ORDER — TETANUS-DIPHTH-ACELL PERTUSSIS 5-2.5-18.5 LF-MCG/0.5 IM SUSP: 0.5000 mL | Freq: Once | INTRAMUSCULAR | 0 refills | Status: AC

## 2016-01-02 NOTE — Patient Instructions (Signed)

## 2016-01-02 NOTE — Progress Notes (Signed)
Loleta BooksRosalba Cherylann RatelOrtiz-Rodriguez is a 35 y.o. yo [id number] at 7914w5d who presents for her initial prenatal visit. Pregnancy is not planned She reports none. She  is taking PNV. See flow sheet for details.  PMH, POBH, FH, meds, allergies and Social Hx reviewed.  Prenatal Exam: Gen: Well nourished, well developed.  No distress.  Vitals noted. HEENT: Normocephalic, atraumatic.  Neck supple without cervical lymphadenopathy, thyromegaly or thyroid nodules.  Fair dentition. CV: RRR no murmur, gallops or rubs Lungs: CTAB.  Normal respiratory effort without wheezes or rales. Abd: soft, NTND. +BS.  GU: Normal external female genitalia without lesions.  Normal vaginal, well rugated without lesions. No vaginal discharge.  Bimanual exam: No adnexal mass or TTP. No CMT.  Uterus size 27cm Ext: No clubbing, cyanosis or edema. Psych: Normal grooming and dress.  Not depressed or anxious appearing.  Normal thought content and process without flight of ideas or looseness of associations.  Assessment & Plan: 1) 35 y.o. yo [id number] at 9614w5d via LMP doing well.  Current pregnancy issues include chronic hypertension. Dating is reliable. Prenatal labs drawn today. Genetic screening: too late. Early glucola is not indicated. Regular glucola today PHQ-9 and Pregnancy Medical Home forms completed and reviewed. Repeat OB US for incomplete anatomy scan views [facility] Flu shot at next visit Last pap normal during last pregancy per report  Chronic HTN noted in problem list - not on medications currently and BP well controlled, monitor closely  Previous C Section - needs prenatal appt with OB (Dr Shawnie PonsPratt) at 30-32 weeks to discuss TOLAC vs repeat CS  Bleeding and pain precautions reviewed. Importance of prenatal vitamins reviewed.  Follow up in 2 weeks.

## 2016-01-03 LAB — OBSTETRIC PANEL
ANTIBODY SCREEN: NEGATIVE
BASOS PCT: 0 %
Basophils Absolute: 0 cells/uL (ref 0–200)
EOS PCT: 1 %
Eosinophils Absolute: 87 cells/uL (ref 15–500)
HCT: 34.6 % — ABNORMAL LOW (ref 35.0–45.0)
HEMOGLOBIN: 11.3 g/dL — AB (ref 11.7–15.5)
Hepatitis B Surface Ag: NEGATIVE
LYMPHS PCT: 27 %
Lymphs Abs: 2349 cells/uL (ref 850–3900)
MCH: 29.5 pg (ref 27.0–33.0)
MCHC: 32.7 g/dL (ref 32.0–36.0)
MCV: 90.3 fL (ref 80.0–100.0)
MONOS PCT: 5 %
MPV: 10 fL (ref 7.5–12.5)
Monocytes Absolute: 435 cells/uL (ref 200–950)
NEUTROS PCT: 67 %
Neutro Abs: 5829 cells/uL (ref 1500–7800)
PLATELETS: 326 10*3/uL (ref 140–400)
RBC: 3.83 MIL/uL (ref 3.80–5.10)
RDW: 14.2 % (ref 11.0–15.0)
RH TYPE: POSITIVE
Rubella: 4.01 Index — ABNORMAL HIGH (ref <0.90)
WBC: 8.7 10*3/uL (ref 3.8–10.8)

## 2016-01-03 LAB — CULTURE, OB URINE: ORGANISM ID, BACTERIA: NO GROWTH

## 2016-01-03 LAB — SICKLE CELL SCREEN: SICKLE CELL SCREEN: NEGATIVE

## 2016-01-03 LAB — CERVICOVAGINAL ANCILLARY ONLY
CHLAMYDIA, DNA PROBE: NEGATIVE
NEISSERIA GONORRHEA: NEGATIVE

## 2016-01-03 LAB — HIV ANTIBODY (ROUTINE TESTING W REFLEX): HIV 1&2 Ab, 4th Generation: NONREACTIVE

## 2016-01-10 ENCOUNTER — Other Ambulatory Visit: Payer: Medicaid Other

## 2016-01-16 ENCOUNTER — Other Ambulatory Visit (INDEPENDENT_AMBULATORY_CARE_PROVIDER_SITE_OTHER): Payer: Self-pay

## 2016-01-16 ENCOUNTER — Ambulatory Visit (INDEPENDENT_AMBULATORY_CARE_PROVIDER_SITE_OTHER): Payer: Self-pay | Admitting: Family Medicine

## 2016-01-16 VITALS — BP 116/58 | HR 90 | Temp 97.9°F | Wt 190.8 lb

## 2016-01-16 DIAGNOSIS — Z3482 Encounter for supervision of other normal pregnancy, second trimester: Secondary | ICD-10-CM

## 2016-01-16 LAB — CBC
HCT: 35.4 % (ref 35.0–45.0)
HEMOGLOBIN: 11.7 g/dL (ref 11.7–15.5)
MCH: 29.3 pg (ref 27.0–33.0)
MCHC: 33.1 g/dL (ref 32.0–36.0)
MCV: 88.7 fL (ref 80.0–100.0)
MPV: 10.3 fL (ref 7.5–12.5)
Platelets: 327 10*3/uL (ref 140–400)
RBC: 3.99 MIL/uL (ref 3.80–5.10)
RDW: 14.4 % (ref 11.0–15.0)
WBC: 10.2 10*3/uL (ref 3.8–10.8)

## 2016-01-16 LAB — GLUCOSE, CAPILLARY: Glucose-Capillary: 100 mg/dL — ABNORMAL HIGH (ref 65–99)

## 2016-01-16 NOTE — Progress Notes (Signed)
Tara [name] is a 35 y.o. [id number] at 6466w5d here for routine follow up.  She reports doing well without concerns. See flow sheet for details.  A/P: Pregnancy at 2466w5d.  Doing well.   Pregnancy issues include h/o HTN  Infant feeding choice: breast and formula Contraception choice: desires Mirena Infant circumcision desired no  Tdap was not given today. 1 hour glucola, CBC, RPR, and HIV were not done today.  To make lab only appt later this week as she has had blood drawn 4 times today already Pregnancy medical home and PHQ-9 forms were done today and reviewed.   Rh status was reviewed and patient does not need Rhogam.  Rhogam was not given today.   Childbirth and education classes were offered. Preterm labor and fetal movement precautions reviewed. Follow up 2 weeks.  Chronic HTN - BP well controlled today, no medications, continue to monitor  Previous C section - prenatal appt with Dr Shawnie PonsPratt to discuss TOLAC vs repeat CS in 2 weeks

## 2016-01-16 NOTE — Patient Instructions (Signed)
Ranitidine 150mg  daily  Segundo trimestre de Psychiatristembarazo (Second Trimester of Pregnancy) El segundo trimestre va desde la semana13 hasta la 28, desde el cuarto hasta el sexto mes, y suele ser el momento en el que mejor se siente. Su organismo se ha adaptado a Charity fundraiserestar embarazada y comienza a Diplomatic Services operational officersentirse fsicamente mejor. En general, las nuseas matutinas han disminuido o han desaparecido completamente, puede tener ms energa y un aumento de apetito. El segundo trimestre es tambin la poca en la que el feto se desarrolla rpidamente. Hacia el final del sexto mes, el feto mide aproximadamente 9pulgadas (23cm) y pesa alrededor de 1 libras (700g). Es probable que sienta que el beb se Teacher, English as a foreign languagemueve (da pataditas) entre las 18 y 20semanas del Psychiatristembarazo. CAMBIOS EN EL ORGANISMO Su organismo atraviesa por muchos cambios durante el Carlisle Barracksembarazo, y estos varan de Neomia Dearuna mujer a Educational psychologistotra.   Seguir American Standard Companiesaumentando de peso. Notar que la parte baja del abdomen sobresale.  Podrn aparecer las primeras Albertson'sestras en las caderas, el abdomen y las Waldorfmamas.  Es posible que tenga dolores de cabeza que pueden aliviarse con los medicamentos que el mdico le permita tomar.  Tal vez tenga necesidad de orinar con ms frecuencia porque el feto est ejerciendo presin Ambulance personsobre la vejiga.  Debido al Vanetta Muldersembarazo podr sentir Anthoney Haradaacidez estomacal con frecuencia.  Puede estar estreida, ya que ciertas hormonas enlentecen los movimientos de los msculos que New York Life Insuranceempujan los desechos a travs de los intestinos.  Pueden aparecer hemorroides o abultarse e hincharse las venas (venas varicosas).  Puede tener dolor de espalda que se debe al Citigroupaumento de peso y a que las hormonas del Management consultantembarazo relajan las articulaciones entre los huesos de la pelvis, y Public librariancomo consecuencia de la modificacin del peso y los msculos que mantienen el equilibrio.  Las ConAgra Foodsmamas seguirn creciendo y Development worker, communityle dolern.  Las Veterinary surgeonencas pueden sangrar y estar sensibles al cepillado y al hilo dental.  Pueden  aparecer zonas oscuras o manchas (cloasma, mscara del Psychiatristembarazo) en el rostro que probablemente se atenuar despus del nacimiento del beb.  Es posible que se forme una lnea oscura desde el ombligo hasta la zona del pubis (linea nigra) que probablemente se atenuar despus del nacimiento del beb.  Tal vez haya cambios en el cabello que pueden incluir su engrosamiento, crecimiento rpido y cambios en la textura. Adems, a algunas mujeres se les cae el cabello durante o despus del embarazo, o tienen el cabello seco o fino. Lo ms probable es que el cabello se le normalice despus del nacimiento del beb. QU DEBE ESPERAR EN LAS CONSULTAS PRENATALES Durante una visita prenatal de rutina:  La pesarn para asegurarse de que usted y el feto estn creciendo normalmente.  Le tomarn la presin arterial.  Le medirn el abdomen para controlar el desarrollo del beb.  Se escucharn los latidos cardacos fetales.  Se evaluarn los resultados de los estudios solicitados en visitas anteriores. El mdico puede preguntarle lo siguiente:  Cmo se siente.  Si siente los movimientos del beb.  Si ha tenido sntomas anormales, como prdida de lquido, Hamletsangrado, dolores de cabeza intensos o clicos abdominales.  Si est consumiendo algn producto que contenga tabaco, como cigarrillos, tabaco de Theatre managermascar y Administrator, Civil Servicecigarrillos electrnicos.  Si tiene Colgate-Palmolivealguna pregunta. Otros estudios que podrn realizarse durante el segundo trimestre incluyen lo siguiente:  Anlisis de sangre para detectar lo siguiente:  Concentraciones de hierro bajas (anemia).  Diabetes gestacional (entre la semana 24 y la 28).  Anticuerpos Rh.  Anlisis de orina para detectar infecciones, diabetes  o protenas en la orina.  Una ecografa para confirmar que el beb crece y se desarrolla correctamente.  Una amniocentesis para diagnosticar posibles problemas genticos.  Estudios del feto para descartar espina bfida y sndrome de  Down.  Prueba del VIH (virus de inmunodeficiencia humana). Los exmenes prenatales de rutina incluyen la prueba de deteccin del VIH, a menos que decida no Futures trader. INSTRUCCIONES PARA EL CUIDADO EN EL HOGAR   Evite fumar, consumir hierbas, beber alcohol y tomar frmacos que no le hayan recetado. Estas sustancias qumicas afectan la formacin y el desarrollo del beb.  No consuma ningn producto que contenga tabaco, lo que incluye cigarrillos, tabaco de Theatre manager y Administrator, Civil Service. Si necesita ayuda para dejar de fumar, consulte al American Express. Puede recibir asesoramiento y otro tipo de recursos para dejar de fumar.  Siga las indicaciones del mdico en relacin con el uso de medicamentos. Durante el embarazo, hay medicamentos que son seguros de tomar y otros que no.  Haga ejercicio solamente como se lo haya indicado el mdico. Sentir clicos uterinos es un buen signo para Restaurant manager, fast food actividad fsica.  Contine comiendo alimentos sanos con regularidad.  Use un sostn que le brinde buen soporte si le [employer].  No se d baos de inmersin en agua caliente, baos turcos ni saunas.  Use el cinturn de seguridad en todo momento mientras conduce.  No coma carne cruda ni queso sin cocinar; evite el contacto con las bandejas sanitarias de los gatos y la tierra que estos animales usan. Estos elementos contienen grmenes que pueden causar defectos congnitos en el beb.  Tome las vitaminas prenatales.  Tome entre 1500 y 2000mg  de calcio diariamente comenzando en la semana20 del embarazo [place].  Si est estreida, pruebe un laxante suave (si el mdico lo autoriza). Consuma ms alimentos ricos en fibra, como vegetales y frutas frescos y Radiation protection practitioner. Beba gran cantidad de lquido para mantener la orina de tono claro o color amarillo plido.  Dese baos de asiento con agua tibia para Engineer, materials o las molestias causadas por las hemorroides. Use una crema para las  hemorroides si el mdico la autoriza.  Si tiene venas varicosas, use medias de descanso. Eleve los pies durante , 3 o 4veces por da. Limite el consumo de sal en su dieta.  No levante objetos pesados, use zapatos de tacones bajos y [address].  Descanse con las piernas elevadas si tiene calambres o dolor de cintura.  Visite a su dentista si an no lo ha Occupational hygienist. Use un cepillo de dientes blando para higienizarse los dientes y psese el hilo dental con suavidad.  Puede seguir [employer], a menos que el mdico le indique lo contrario.  Concurra a todas las visitas prenatales segn las indicaciones de su mdico. SOLICITE ATENCIN MDICA SI:   [place].  Siente clicos leves, presin en la pelvis o dolor persistente en el abdomen.  Tiene nuseas, vmitos o diarrea persistentes.  Brett Fairy secrecin vaginal con mal olor.  Siente dolor al ConocoPhillips. SOLICITE ATENCIN MDICA DE INMEDIATO SI:   Tiene fiebre.  Tiene una prdida de lquido por la vagina.  Tiene sangrado o pequeas prdidas vaginales.  Siente dolor intenso o clicos en el abdomen.  Sube o baja de peso rpidamente.  Tiene dificultad para respirar y siente dolor de pecho.  Sbitamente se le hinchan mucho el rostro, las [place], los tobillos, los pies o las piernas.  No ha sentido los  movimientos del beb durante una hora.  Siente un dolor de cabeza intenso que no se alivia con medicamentos.  Su visin se modifica.   Esta informacin no tiene Theme park manager el consejo del mdico. Asegrese de hacerle al mdico cualquier pregunta que tenga.   Document Released: 01/08/2005 Document Revised: 04/21/2014 Elsevier Interactive Patient Education [employer]

## 2016-01-17 ENCOUNTER — Encounter: Payer: Self-pay | Admitting: Family Medicine

## 2016-01-17 LAB — GLUCOSE TOLERANCE, 3 HOURS
GLUCOSE, 1 HOUR-GESTATIONAL: 144 mg/dL (ref <190)
Glucose Tolerance, 2 hour: 88 mg/dL (ref <165)
Glucose Tolerance, Fasting: 82 mg/dL (ref 65–104)
Glucose, GTT - 3 Hour: 121 mg/dL (ref <145)

## 2016-01-17 LAB — RPR

## 2016-01-17 LAB — HIV ANTIBODY (ROUTINE TESTING W REFLEX): HIV 1&2 Ab, 4th Generation: NONREACTIVE

## 2016-01-24 ENCOUNTER — Telehealth: Payer: Self-pay | Admitting: Family Medicine

## 2016-01-24 NOTE — Telephone Encounter (Signed)
Pt is calling because she needs have orders faxed to the health dept so that she can get her ultrasound done. Please fax them to [phone]. They needs these ASAP since the patient has an appointment today. jw

## 2016-01-28 NOTE — Telephone Encounter (Signed)
I was out of office that afternoon and the next day.  Can someone clarify if orders were sent (I had filled out HD OB US form at previous visit)?  It probably is too late to send form now.  Erasmo DownerAngela M Bacigalupo, MD, MPH PGY-3,  [facility] 01/28/2016 10:21 AM

## 2016-02-04 ENCOUNTER — Ambulatory Visit (INDEPENDENT_AMBULATORY_CARE_PROVIDER_SITE_OTHER): Payer: Medicaid Other | Admitting: Family Medicine

## 2016-02-04 ENCOUNTER — Other Ambulatory Visit: Payer: Self-pay | Admitting: Family Medicine

## 2016-02-04 VITALS — BP 112/62 | HR 88 | Temp 97.9°F | Wt 194.0 lb

## 2016-02-04 DIAGNOSIS — Z3483 Encounter for supervision of other normal pregnancy, third trimester: Secondary | ICD-10-CM

## 2016-02-04 DIAGNOSIS — O34219 Maternal care for unspecified type scar from previous cesarean delivery: Secondary | ICD-10-CM | POA: Insufficient documentation

## 2016-02-04 MED ORDER — INFLUENZA VAC SUBUNIT QUAD 0.5 ML IM SUSY: 1.0000 | PREFILLED_SYRINGE | Freq: Once | INTRAMUSCULAR | 0 refills | Status: AC

## 2016-02-04 NOTE — Progress Notes (Signed)
PRENATAL VISIT NOTE  Subjective:  Tara [name] is a 35 y.o. [id number] at [redacted]w[redacted]d being seen today for ongoing prenatal care.  She is currently monitored for the following issues for this low-risk pregnancy and has Pregnant; Encounter for supervision of normal pregnancy; and Previous cesarean delivery, antepartum condition or complication on her problem list.  Patient reports no complaints.  Contractions: Not present. Vag. Bleeding: None.  Movement: Present. Denies leaking of fluid.   The following portions of the patient's history were reviewed and updated as appropriate: allergies, current medications, past family history, past medical history, past social history, past surgical history and problem list. Problem list updated.  Objective:   Vitals:   02/04/16 1412  BP: 112/62  Pulse: 88  Temp: 97.9 F (36.6 C)  Weight: 194 lb (88 kg)    Fetal Status: Fetal Heart Rate (bpm): 140 Fundal Height: 30 cm Movement: Present     General:  Alert, oriented and cooperative. Patient is in no acute distress.  Skin: Skin is warm and dry. No rash noted.   Cardiovascular: Normal heart rate noted  Respiratory: Normal respiratory effort, no problems with respiration noted  Abdomen: Soft, gravid, appropriate for gestational age. Pain/Pressure: Absent     Pelvic:  Cervical exam deferred        Extremities: Normal range of motion.  Edema: None  Mental Status: Normal mood and affect. Normal behavior. Normal judgment and thought content.   Assessment and Plan:  Pregnancy: [id number] at [redacted]w[redacted]d  1. Encounter for supervision of other normal pregnancy in third trimester Continue routine prenatal care.   2. Previous cesarean delivery, antepartum condition or complication Discussed trial of labor after cesarean section (TOLAC) versus elective repeat cesarean delivery (ERCD). The following risks were discussed with the patient.  Risk of uterine rupture at term is 0.78 percent with TOLAC and 0.22  percent with ERCD. 1 in 10 uterine ruptures will result in neonatal death or neurological injury. The benefits of a trial of labor after cesarean (TOLAC) resulting in a vaginal birth after cesarean (VBAC) include the following: shorter length of hospital stay and postpartum recovery (in most cases); fewer complications, such as postpartum fever, wound or uterine infection, thromboembolism (blood clots in the leg or lung), need for blood transfusion and fewer neonatal breathing problems. The risks of an attempted VBAC or TOLAC include the following: Risk of failed trial of labor after cesarean (TOLAC) without a vaginal birth after cesarean (VBAC) resulting in repeat cesarean delivery (RCD) in about 20 to 40 percent of women who attempt VBAC.  Risk of rupture of uterus resulting in an emergency cesarean delivery. The risk of uterine rupture may be related in part to the type of uterine incision made during the first cesarean delivery. A previous transverse uterine incision has the lowest risk of rupture (0.2 to 1.5 percent risk). Vertical or T-shaped uterine incisions have a higher risk of uterine rupture (4 to 9 percent risk)The risk of fetal death is very low with both VBAC and elective repeat cesarean delivery (ERCD), but the likelihood of fetal death is higher with VBAC than with ERCD. Maternal death is very rare with either type of delivery. The risks of an elective repeat cesarean delivery (ERCD) were reviewed with the patient including but not limited to: 05/998 risk of uterine rupture which could have serious consequences, bleeding which may require transfusion; infection which may require antibiotics; injury to bowel, bladder or other surrounding organs (bowel, bladder, ureters); injury to the fetus; need for  additional procedures including hysterectomy in the event of a life-threatening hemorrhage; thromboembolic phenomenon; abnormal placentation; incisional problems; death and other postoperative or  anesthesia complications.    These risks and benefits are summarized on the consent form, which was reviewed with the patient during the visit.  All her questions answered and she signed a consent indicating a preference for TOLAC. A copy of the consent was given to the patient.  (She does say that she would want tubal sterilization in the event a cesarean section is performed due to any maternal-fetal indication. Additional risks of regret, failure rate of 0.5-1%  with increased risk of ectopic gestation and permanent and irreversible nature of the procedure.  Other forms of reversible BCM discussed, also discussed vasectomy, patient desires BTS).  Tubal papers signed today.  [facility]  Preterm labor symptoms and general obstetric precautions including but not limited to vaginal bleeding, contractions, leaking of fluid and fetal movement were reviewed in detail with the patient. Please refer to After Visit Summary for other counseling recommendations.  Return in 2 weeks (on 02/18/2016).  Reva Boresanya S Lashonne Shull, MD

## 2016-02-04 NOTE — Patient Instructions (Addendum)
Tercer trimestre de embarazo (Third Trimester of Pregnancy) El tercer trimestre comprende desde la semana29 hasta la semana42, es decir, desde el mes7 hasta el mes9. El tercer trimestre es un perodo en el que el feto crece rpidamente. Hacia el final del noveno mes, el feto mide alrededor de 20pulgadas (45cm) de largo y pesa entre 6 y 10 libras (2,700 y 4,500kg).  CAMBIOS EN EL ORGANISMO Su organismo atraviesa por muchos cambios durante el embarazo, y estos varan de una mujer a otra.   Seguir aumentando de peso. Es de esperar que aumente entre 25 y 35libras (11 y 16kg) hacia el final del embarazo.  Podrn aparecer las primeras estras en las caderas, el abdomen y las mamas.  Puede tener necesidad de orinar con ms frecuencia porque el feto baja hacia la pelvis y ejerce presin sobre la vejiga.  Debido al embarazo podr sentir acidez estomacal con frecuencia.  Puede estar estreida, ya que ciertas hormonas enlentecen los movimientos de los msculos que empujan los desechos a travs de los intestinos.  Pueden aparecer hemorroides o abultarse e hincharse las venas (venas varicosas).  Puede sentir dolor plvico debido al aumento de peso y a que las hormonas del embarazo relajan las articulaciones entre los huesos de la pelvis. El dolor de espalda puede ser consecuencia de la sobrecarga de los msculos que soportan la postura.  Tal vez haya cambios en el cabello que pueden incluir su engrosamiento, crecimiento rpido y cambios en la textura. Adems, a algunas mujeres se les cae el cabello durante o despus del embarazo, o tienen el cabello seco o fino. Lo ms probable es que el cabello se le normalice despus del nacimiento del beb.  Las mamas seguirn creciendo y le dolern. A veces, puede haber una secrecin amarilla de las mamas llamada calostro.  El ombligo puede salir hacia afuera.  Puede sentir que le falta el aire debido a que se expande el tero.  Puede notar que el feto  "baja" o lo siente ms bajo, en el abdomen.  Puede tener una prdida de secrecin mucosa con sangre. Esto suele ocurrir en el trmino de unos pocos das a una semana antes de que comience el trabajo de parto.  El cuello del tero se vuelve delgado y blando (se borra) cerca de la fecha de parto. QU DEBE ESPERAR EN LOS EXMENES PRENATALES  Le harn exmenes prenatales cada 2semanas hasta la semana36. A partir de ese momento le harn exmenes semanales. Durante una visita prenatal de rutina:  La pesarn para asegurarse de que usted y el feto estn creciendo normalmente.  Le tomarn la presin arterial.  Le medirn el abdomen para controlar el desarrollo del beb.  Se escucharn los latidos cardacos fetales.  Se evaluarn los resultados de los estudios solicitados en visitas anteriores.  Le revisarn el cuello del tero cuando est prxima la fecha de parto para controlar si este se ha borrado. Alrededor de la semana36, el mdico le revisar el cuello del tero. Al mismo tiempo, realizar un anlisis de las secreciones del tejido vaginal. Este examen es para determinar si hay un tipo de bacteria, estreptococo Grupo B. El mdico le explicar esto con ms detalle. El mdico puede preguntarle lo siguiente:  Cmo le gustara que fuera el parto.  Cmo se siente.  Si siente los movimientos del beb.  Si ha tenido sntomas anormales, como prdida de lquido, sangrado, dolores de cabeza intensos o clicos abdominales.  Si est consumiendo algn producto que contenga tabaco, como cigarrillos, tabaco   de mascar y cigarrillos electrnicos.  Si tiene alguna pregunta. Otros exmenes o estudios de deteccin que pueden realizarse durante el tercer trimestre incluyen lo siguiente:  Anlisis de sangre para controlar los niveles de hierro (anemia).  Controles fetales para determinar su salud, nivel de actividad y crecimiento. Si tiene alguna enfermedad o hay problemas durante el embarazo, le harn  estudios.  Prueba del VIH (virus de inmunodeficiencia humana). Si corre un riesgo alto, pueden realizarle una prueba de deteccin del VIH durante el tercer trimestre del embarazo. FALSO TRABAJO DE PARTO Es posible que sienta contracciones leves e irregulares que finalmente desaparecen. Se llaman contracciones de Braxton Hicks o falso trabajo de parto. Las contracciones pueden durar horas, das o incluso semanas, antes de que el verdadero trabajo de parto se inicie. Si las contracciones ocurren a intervalos regulares, se intensifican o se hacen dolorosas, lo mejor es que la revise el mdico.  SIGNOS DE TRABAJO DE PARTO   Clicos de tipo menstrual.  Contracciones cada 5minutos o menos.  Contracciones que comienzan en la parte superior del tero y se extienden hacia abajo, a la zona inferior del abdomen y la espalda.  Sensacin de mayor presin en la pelvis o dolor de espalda.  Una secrecin de mucosidad acuosa o con sangre que sale de la vagina. Si tiene alguno de estos signos antes de la semana37 del embarazo, llame a su mdico de inmediato. Debe concurrir al hospital para que la controlen inmediatamente. INSTRUCCIONES PARA EL CUIDADO EN EL HOGAR   Evite fumar, consumir hierbas, beber alcohol y tomar frmacos que no le hayan recetado. Estas sustancias qumicas afectan la formacin y el desarrollo del beb.  No consuma ningn producto que contenga tabaco, lo que incluye cigarrillos, tabaco de mascar y cigarrillos electrnicos. Si necesita ayuda para dejar de fumar, consulte al mdico. Puede recibir asesoramiento y otro tipo de recursos para dejar de fumar.  Siga las indicaciones del mdico en relacin con el uso de medicamentos. Durante el embarazo, hay medicamentos que son seguros de tomar y otros que no.  Haga ejercicio solamente como se lo haya indicado el mdico. Sentir clicos uterinos es un buen signo para detener la actividad fsica.  Contine comiendo alimentos sanos con  regularidad.  Use un sostn que le brinde buen soporte si le duelen las mamas.  No se d baos de inmersin en agua caliente, baos turcos ni saunas.  Use el cinturn de seguridad en todo momento mientras conduce.  No coma carne cruda ni queso sin cocinar; evite el contacto con las bandejas sanitarias de los gatos y la tierra que estos animales usan. Estos elementos contienen grmenes que pueden causar defectos congnitos en el beb.  Tome las vitaminas prenatales.  Tome entre 1500 y 2000mg de calcio diariamente comenzando en la semana20 del embarazo hasta el parto.  Si est estreida, pruebe un laxante suave (si el mdico lo autoriza). Consuma ms alimentos ricos en fibra, como vegetales y frutas frescos y cereales integrales. Beba gran cantidad de lquido para mantener la orina de tono claro o color amarillo plido.  Dese baos de asiento con agua tibia para aliviar el dolor o las molestias causadas por las hemorroides. Use una crema para las hemorroides si el mdico la autoriza.  Si tiene venas varicosas, use medias de descanso. Eleve los pies durante 15minutos, 3 o 4veces por da. Limite el consumo de sal en su dieta.  Evite levantar objetos pesados, use zapatos de tacones bajos y mantenga una buena postura.  Descanse   con las piernas elevadas si tiene calambres o dolor de cintura.  Visite a su dentista si no lo ha [place] manager. Use un cepillo de dientes blando para higienizarse los dientes y psese el hilo dental con suavidad.  Puede seguir [employer], a menos que el mdico le indique lo contrario.  No haga viajes largos excepto que sea absolutamente necesario y solo con la autorizacin del [place] clases prenatales para Development worker, international aid, Psychologist, prison and probation services y hacer preguntas sobre el [place] de parto y [place].  Haga un ensayo de la partida al hospital.  Prepare el bolso que llevar al hospital.  Prepare la habitacin del beb.  Concurra a todas  las visitas prenatales segn las indicaciones de su mdico. SOLICITE ATENCIN MDICA SI:  No est segura de que est en trabajo de parto o de que ha roto la bolsa de las aguas.  Tiene mareos.  Siente clicos leves, presin en la pelvis o dolor persistente en el abdomen.  Tiene nuseas, vmitos o diarrea persistentes.  Margette Fast secrecin vaginal con mal olor.  Siente dolor al [employer]. SOLICITE ATENCIN MDICA DE INMEDIATO SI:   Tiene fiebre.  Tiene una prdida de lquido por la vagina.  Tiene sangrado o pequeas prdidas vaginales.  Siente dolor intenso o clicos en el abdomen.  Sube o baja de peso rpidamente.  Tiene dificultad para respirar y siente dolor de pecho.  Sbitamente se le hinchan mucho el rostro, las [place], los tobillos, los pies o las piernas.  No ha sentido los movimientos del beb durante Leone Brand.  Siente un dolor de cabeza intenso que no se alivia con medicamentos.  Su visin se modifica.   Esta informacin no tiene Marine scientist el consejo del mdico. Asegrese de hacerle al mdico cualquier pregunta que tenga.   Document Released: 01/08/2005 Document Revised: 04/21/2014 Elsevier Interactive Patient Education 2016 [place] (Breastfeeding) Decidir Economist es una de las mejores elecciones que puede hacer por usted y su beb. El cambio hormonal durante el Media planner produce el desarrollo del tejido mamario y [place] la cantidad y el tamao de los conductos galactforos. Estas hormonas tambin permiten que las protenas, los azcares y las grasas de la sangre produzcan la Northeast Utilities materna en las glndulas productoras de [place]. Las hormonas impiden que la leche materna sea liberada antes del nacimiento del beb, adems de impulsar el flujo de leche luego del nacimiento. Una vez que ha comenzado a Economist, Freight forwarder beb, as Therapist, occupational succin o Social research officer, government, pueden estimular la liberacin de [place] de las glndulas productoras de  [place].  LOS BENEFICIOS DE AMAMANTAR Para el beb  La primera leche (calostro) ayuda a Garment/textile technologist funcionamiento del sistema digestivo del beb.  La leche tiene anticuerpos que ayudan a Chemical engineer las infecciones en el beb.  El beb tiene una menor incidencia de asma, alergias y del sndrome de muerte sbita del lactante.  Los nutrientes en la [place] materna son mejores para el beb que la [place] maternizada y estn preparados exclusivamente para cubrir las necesidades del beb.  La leche materna mejora el desarrollo cerebral del beb.  Es menos probable que el beb desarrolle otras enfermedades, como obesidad infantil, asma o diabetes mellitus de tipo 2. Para usted   La lactancia materna favorece el desarrollo de un vnculo muy especial entre la madre y el beb.  Es conveniente. La leche materna siempre est disponible a la Tree surgeon y es [place].  La lactancia  materna ayuda a quemar caloras y a perder el peso ganado durante el embarazo.  Favorece la contraccin del tero al tamao que tena antes del embarazo de manera ms rpida y disminuye el sangrado (loquios) despus del parto.  La lactancia materna contribuye a reducir el riesgo de desarrollar diabetes mellitus de tipo 2, osteoporosis o cncer de mama o de ovario en el futuro. SIGNOS DE QUE EL BEB EST HAMBRIENTO Primeros signos de hambre  Aumenta su estado de alerta o actividad.  Se estira.  Mueve la cabeza de un lado a otro.  Mueve la cabeza y abre la boca cuando se le toca la mejilla o la comisura de la boca (reflejo de bsqueda).  Aumenta las vocalizaciones, tales como sonidos de succin, se relame los labios, emite arrullos, suspiros, o chirridos.  Mueve la mano hacia la boca.  Se chupa con ganas los dedos o las manos. Signos tardos de hambre  Est agitado.  Llora de manera intermitente. Signos de hambre extrema Los signos de hambre extrema requerirn que lo calme y lo consuele antes de que el  beb pueda alimentarse adecuadamente. No espere a que se manifiesten los siguientes signos de hambre extrema para comenzar a amamantar:   Agitacin.  Llanto intenso y fuerte.  Gritos. INFORMACIN BSICA SOBRE LA LACTANCIA MATERNA Iniciacin de la lactancia materna  Encuentre un lugar cmodo para sentarse o acostarse, con un buen respaldo para el cuello y la espalda.  Coloque una almohada o una manta enrollada debajo del beb para acomodarlo a la altura de la mama (si est sentada). Las almohadas para amamantar se han diseado especialmente a fin de servir de apoyo para los brazos y el beb mientras amamanta.  Asegrese de que el abdomen del beb est frente al suyo.   Masajee suavemente la mama. Con las yemas de los dedos, masajee la pared del pecho hacia el pezn en un movimiento circular. Esto estimula el flujo de leche. Es posible que deba continuar este movimiento mientras amamanta si la leche fluye lentamente.  Sostenga la mama con el pulgar por arriba del pezn y los otros 4 dedos por debajo de la mama. Asegrese de que los dedos se encuentren lejos del pezn y de la boca del beb.  Empuje suavemente los labios del beb con el pezn o con el dedo.  Cuando la boca del beb se abra lo suficiente, acrquelo rpidamente a la mama e introduzca todo el pezn y la zona oscura que lo rodea (areola), tanto como sea posible, dentro de la boca del beb.  Debe haber ms areola visible por arriba del labio superior del beb que por debajo del labio inferior.  La lengua del beb debe estar entre la enca inferior y la mama.  Asegrese de que la boca del beb est en la posicin correcta alrededor del pezn (prendida). Los labios del beb deben crear un sello sobre la mama y estar doblados hacia afuera (invertidos).  Es comn que el beb succione durante 2 a 3 minutos para que comience el flujo de leche materna. Cmo debe prenderse Es muy importante que le ensee al beb cmo prenderse  adecuadamente a la mama. Si el beb no se prende adecuadamente, puede causarle dolor en el pezn y reducir la produccin de leche materna, y hacer que el beb tenga un escaso aumento de peso. Adems, si el beb no se prende adecuadamente al pezn, puede tragar aire durante la alimentacin. Esto puede causarle molestias al beb. Hacer eructar al beb al   cambiar de mama puede ayudarlo a liberar el aire. Sin embargo, ensearle al beb cmo prenderse a la mama adecuadamente es la mejor manera de evitar que se sienta molesto por tragar aire mientras se alimenta. Signos de que el beb se ha prendido adecuadamente al pezn:   Tironea o succiona de modo silencioso, sin causarle dolor.  Se escucha que traga cada 3 o 4 succiones.  Hay movimientos musculares por arriba y por delante de sus odos al succionar. Signos de que el beb no se ha prendido adecuadamente al pezn:   Hace ruidos de succin o de chasquido mientras se alimenta.  Siente dolor en el pezn. Si cree que el beb no se prendi correctamente, deslice el dedo en la comisura de la boca y colquelo entre las encas del beb para interrumpir la succin. Intente comenzar a amamantar nuevamente. Signos de lactancia materna exitosa Signos del beb:   Disminuye gradualmente el nmero de succiones o cesa la succin por completo.  Se duerme.  Relaja el cuerpo.  Retiene una pequea cantidad de leche en la boca.  Se desprende solo del pecho. Signos que presenta usted:  Las mamas han aumentado la firmeza, el peso y el tamao 1 a 3 horas despus de amamantar.  Estn ms blandas inmediatamente despus de amamantar.  Un aumento del volumen de leche, y tambin un cambio en su consistencia y color se producen hacia el quinto da de lactancia materna.  Los pezones no duelen, ni estn agrietados ni sangran. Signos de que su beb recibe la cantidad de leche suficiente  Moja al menos 3 paales en 24 horas. La orina debe ser clara y de color  amarillo plido a los 5 das de vida.  Defeca al menos 3 veces en 24 horas a los 5 das de vida. La materia fecal debe ser blanda y amarillenta.  Defeca al menos 3 veces en 24 horas a los 7 das de vida. La materia fecal debe ser grumosa y amarillenta.  No registra una prdida de peso mayor del 10% del peso al nacer durante los primeros 3 das de vida.  Aumenta de peso un promedio de 4 a 7onzas (113 a 198g) por semana despus de los 4 das de vida.  Aumenta de peso, diariamente, de manera uniforme a partir de los 5 das de vida, sin registrar prdida de peso despus de las 2semanas de vida. Despus de alimentarse, es posible que el beb regurgite una pequea cantidad. Esto es frecuente. FRECUENCIA Y DURACIN DE LA LACTANCIA MATERNA El amamantamiento frecuente la ayudar a producir ms leche y a prevenir problemas de dolor en los pezones e hinchazn en las mamas. Alimente al beb cuando muestre signos de hambre o si siente la necesidad de reducir la congestin de las mamas. Esto se denomina "lactancia a demanda". Evite el uso del chupete mientras trabaja para establecer la lactancia (las primeras 4 a 6 semanas despus del nacimiento del beb). Despus de este perodo, podr ofrecerle un chupete. Las investigaciones demostraron que el uso del chupete durante el primer ao de vida del beb disminuye el riesgo de desarrollar el sndrome de muerte sbita del lactante (SMSL). Permita que el nio se alimente en cada mama todo lo que desee. Contine amamantando al beb hasta que haya terminado de alimentarse. Cuando el beb se desprende o se queda dormido mientras se est alimentando de la primera mama, ofrzcale la segunda. Debido a que, con frecuencia, los recin nacidos permanecen somnolientos las primeras semanas de vida, es posible   que deba despertar al beb para alimentarlo. Los horarios de Writer de un beb a otro. Sin embargo, las siguientes reglas pueden servir como gua para ayudarla a  Engineer, materials que el beb se alimenta adecuadamente:  Se puede amamantar a los recin nacidos (bebs de 4 semanas o menos de vida) cada 1 a 3 horas.  No deben transcurrir ms de 3 horas durante el da o 5 horas durante la noche sin que se amamante a los recin nacidos.  Debe amamantar al beb 8 veces como mnimo en un perodo de 24 horas, hasta que comience a introducir slidos en su dieta, a los 6 meses de vida aproximadamente. [place] extraccin y Recruitment consultant de la leche materna le permiten asegurarse de que el beb se alimente exclusivamente de Kaplan, aun en momentos en los que no puede amamantar. Esto tiene especial importancia si debe regresar al [place] en el perodo en que an est amamantando o si no puede estar presente en los momentos en que el beb debe alimentarse. Su asesor en lactancia puede orientarla sobre cunto tiempo es [place].  El sacaleche es un aparato que le permite extraer leche de la mama a un recipiente estril. Luego, la leche materna extrada puede almacenarse en un refrigerador o Pension scheme manager. Algunos sacaleches son Theodore Demark, James Ivanoff otros son elctricos. Consulte a su asesor en lactancia qu tipo ser ms conveniente para usted. Los sacaleches se pueden comprar; sin embargo, algunos hospitales y grupos de apoyo a la lactancia materna alquilan Production assistant, radio. Un asesor en lactancia puede ensearle cmo extraer [employer], en caso de que prefiera no usar un sacaleche.  CMO CUIDAR LAS MAMAS DURANTE LA LACTANCIA MATERNA Los pezones se secan, agrietan y duelen durante la Therapist, nutritional. Las siguientes recomendaciones pueden ayudarla a Theatre manager las [employer] y sanas:  Art therapist usar jabn en los pezones.  Use un sostn de soporte. Aunque no son esenciales, las camisetas sin mangas o los sostenes especiales para Economist estn diseados para acceder fcilmente a las mamas, para Economist  sin tener que quitarse todo el sostn o la camiseta. Evite usar sostenes con aro o sostenes muy ajustados.  Seque al aire sus pezones durante 3 a 58minutos despus de amamantar al beb.  Utilice solo apsitos de Chiropodist sostn para Tax adviser las prdidas de [place]. La prdida de un poco de [employer] tomas es normal.  Utilice lanolina sobre los pezones luego de Economist. La lanolina ayuda a mantener la humedad normal de la piel. Si [place] lanolina pura, no tiene que lavarse los pezones antes de volver a Research scientist (life sciences) al beb. La lanolina pura no es txica para el beb. Adems, puede extraer [employer] algunas gotas de [place] materna y Community education officer suavemente esa [employer], para que la [place] se seque al aire. Durante las primeras semanas despus de dar a luz, algunas mujeres pueden experimentar hinchazn en las mamas (congestin [place]). La congestin puede hacer que sienta las mamas pesadas, calientes y sensibles al tacto. El pico de la congestin ocurre dentro de los 3 a 5 das despus del [place]. Las siguientes recomendaciones pueden ayudarla a Public house manager la congestin:  Vace por completo las mamas al [place]. Puede aplicar calor hmedo en las mamas (en la ducha o con toallas hmedas para manos) antes de Economist o extraer Northeast Utilities. Esto aumenta la circulacin y [place] a que la [place]. Si el beb no vaca por completo las  mamas cuando lo amamanta, extraiga la leche restante despus de que haya finalizado.  Use un sostn ajustado (para amamantar o comn) o una camiseta sin mangas durante 1 o 2 das para indicar al cuerpo que disminuya ligeramente la produccin de N[place] Headleche.  Aplique compresas de hielo Yahoo! Incsobre las mamas, a menos que le resulte demasiado incmodo.  Asegrese de que el beb est prendido y se encuentre en la posicin correcta mientras lo alimenta. Si la congestin persiste luego de 48 horas o despus de seguir estas recomendaciones, comunquese con su  mdico o un Holiday representativeasesor en lactancia. RECOMENDACIONES GENERALES PARA EL CUIDADO DE LA SALUD DURANTE LA LACTANCIA MATERNA  Consuma alimentos saludables. [place] comidas y colaciones, y coma 3 de cada una por da. Dado que lo que come Danaher Corporationafecta la leche materna, es posible que algunas comidas hagan que su beb se vuelva ms irritable de lo habitual. Evite comer este tipo de alimentos si percibe que afectan de manera negativa al beb.  Beba leche, jugos de fruta y agua para Patent examinersatisfacer su sed (aproximadamente 10 vasos al Futures traderda).  Descanse con frecuencia, reljese y tome sus vitaminas prenatales para evitar la fatiga, el estrs y la anemia.  Contine con los autocontroles de la mama.  Evite Product managermasticar y fumar tabaco. Las sustancias qumicas de los cigarrillos que pasan a la leche materna y la exposicin al humo ambiental del tabaco pueden daar al beb.  No consuma alcohol ni drogas, incluida la marihuana. Algunos medicamentos, que pueden ser perjudiciales para el beb, pueden pasar a travs de la Colgate Palmoliveleche materna. Es importante que consulte a su mdico antes de Medical sales representativetomar cualquier medicamento, incluidos todos los medicamentos recetados y de Randolphventa libre, as como los suplementos vitamnicos y herbales. Puede quedar embarazada durante la lactancia. Si desea controlar la natalidad, consulte a su mdico cules son las opciones ms seguras para el beb. SOLICITE ATENCIN MDICA SI:   Usted siente que quiere dejar de Museum/gallery exhibitions officeramamantar o se siente frustrada con la lactancia.  Siente dolor en las mamas o en los pezones.  Sus pezones estn agrietados o Water quality scientistsangran.  Sus pechos estn irritados, sensibles o calientes.  Tiene un rea hinchada en cualquiera de las mamas.  Siente escalofros o fiebre.  Tiene nuseas o vmitos.  Presenta una secrecin de otro lquido distinto de la leche materna de los pezones.  Sus mamas no se llenan antes de Museum/gallery exhibitions officeramamantar al beb para el quinto da despus del State Collegeparto.  Se siente triste y  deprimida.  El beb est demasiado somnoliento como para comer bien.  El beb tiene problemas para dormir.  Moja menos de 3 paales en 24 horas.  Defeca menos de 3 veces en 24 horas.  La piel del beb o la parte blanca de los ojos se vuelven amarillentas.  El beb no ha aumentado de [place] a los [address] das de [place]ida. SOLICITE ATENCIN MDICA DE INMEDIATO SI:   El beb est muy cansado Retail buyer(letargo) y no se quiere despertar para comer.  Le sube la fiebre sin causa.   Esta informacin no tiene Theme park managercomo fin reemplazar el consejo del mdico. Asegrese de hacerle al mdico cualquier pregunta que tenga.   Document Released: 03/31/2005 Document Revised: 12/20/2014 Elsevier Interactive Patient Education 2016 [employer] Parto vaginal despus de Neomia Dearuna cesrea (Vaginal Birth After Cesarean Delivery) Un parto vaginal despus de un parto por cesrea es dar a luz por la vagina despus de haber dado a luz por medio de una intervencin Barbadosquirrgica. En el pasado, si una mujer tena  un beb por cesrea, todos los partos posteriores deban hacerse por cesrea. Esto ya no es as. Puede ser seguro para la mam intentar un parto vaginal luego de una cesrea.  Es importante que converse con su mdico desde comienzos del Psychiatrist de modo que pueda Google, beneficios y opciones. Le dar tiempo para decidir qu es lo mejor en su caso particular. La decisin final de tener un parto vaginal o por cesrea debe tomarse en conjunto, entre usted y el mdico. Cualquier cambio en su salud o la de su beb durante el embarazo puede ser motivo de un cambio de decisin respecto del parto vaginal.  LAS MUJERES QUE OPTAN POR EL PARTO VAGINAL, DEBEN CONSULTAR AL MDICO PARA ASEGURARSE DE QUE:  La cesrea anterior se haya realizado con un corte (incisin) uterino transversal (no con una incisin vertical clsica).  El canal de parto es lo suficientemente grande como para que pase el [place].  No ha sido sometida a otras  operaciones del tero.  Durante el trabajo de parto, le realizarn un monitoreo fetal Forensic scientist, en todo momento.  Habr un quirfano disponible y listo en caso de necesitar una cesrea de emergencia.  Un mdico y personal de quirfano estarn disponibles en todo momento durante el [place] de parto, para realizar una cesrea en caso de ser necesario.  Habr un anestesista disponible en caso de necesitar una cesrea de emergencia.  La nursery est lista y cuenta con personal especializado y el equipo disponible para cuidar al beb en caso de emergencia. BENEFICIOS DEL PARTO VAGINAL:  Permanencia ms breve en el hospital.  Prevencin de los riesgos asociados con el parto por cesrea, por ejemplo:  Complicaciones quirrgicas, como apertura o hernia de la incisin.  Lesiones en otros rganos.  Grant Ruts. Esto puede ocurrir si aparece una infeccin despus de la ciruga. Tambin puede ocurrir como reaccin a los medicamentos administrados para adormecerla durante la [place].  Menos prdida de sangre y menos probabilidad de necesitar una transfusin sangunea.  Menor riesgo de cogulos sanguneos e infeccin.  Tiempo ms corto de recuperacin.  Menor riesgo de remocin del tero (histerectoma).  Menor riesgo de que la placenta cubra parcial o completamente la abertura del tero (placenta previa) en embarazos futuros.  Menos riesgos en el [place] de parto y [place] futuros. RIESGOS  Ruptura del tero. Esto ocurre en menos del 1% de los partos vaginales. El riesgo de que eso suceda es mayor si:  Se toman medidas para iniciar el proceso del [place] de parto (inducir Engineer, manufacturing systems) o Risk manager o intensificar las contracciones (aumentar el trabajo de [place]).  Se usan medicamentos para ablandar (madurar) el cuello del tero.  Es necesario extraer el tero (histerectoma) si se rompe. No debe llevarse a cabo si:  La cesrea previa se realiz con una incisin vertical (clsica) o con  forma de T, o usted no sabe cul de [employer] han practicado.  Ha sufrido ruptura del tero.  Ha tenido ciertos tipos de Leisure centre manager en el tero, como la extirpacin de fibromas uterinos. Pregntele a su mdico sobre otros tipos de cirugas que le impiden tener un parto vaginal.  Tiene ciertos problemas mdicos o relacionados con el parto (obsttricos).  El beb est en problemas.  Tuvo dos cesreas previas y ningn parto vaginal. OTRAS COSAS QUE DEBE SABER:  La anestesia peridural es segura.  Es seguro dar vuelta al beb si se encuentra de nalgas (intentar una versin ceflica externa).  Es seguro intentarlo en caso de  mellizos.  El parto vaginal puede no ser apropiado si el beb pesa 8,8lb (4kg) o ms. Sin embargo, las predicciones de [place] no son siempre exactas y no deben ser lo nico a tenerse en cuenta para decidir si el parto vaginal es lo indicado para usted.  Hay aumento en el porcentaje de fracasos si el intervalo entre la cesrea y el parto vaginal es de menos de 19 meses.  Su mdico puede aconsejarle no tener un parto vaginal si tiene preeclampsia (hipertensin, protena en la orina e hinchazn en la cara y las extremidades).  El parto vaginal suele ser exitoso si ya tuvo un parto vaginal previamente.  Tambin suele ser exitoso cuando el trabajo de parto comienza espontneamente antes de la fecha.  El parto vaginal despus de Eustace Quail es similar a un parto espontneo vaginal normal.   Esta informacin no tiene Theme park manager el consejo del mdico. Asegrese de hacerle al mdico cualquier pregunta que tenga.   Document Released: 09/17/2007 Document Revised: 01/19/2013 Elsevier Interactive Patient Education [employer]

## 2016-02-18 ENCOUNTER — Encounter: Payer: Self-pay | Admitting: *Deleted

## 2016-02-19 ENCOUNTER — Encounter: Payer: Self-pay | Admitting: Family Medicine

## 2016-02-21 ENCOUNTER — Encounter: Payer: Self-pay | Admitting: Family Medicine

## 2016-03-05 ENCOUNTER — Other Ambulatory Visit (HOSPITAL_COMMUNITY)
Admission: RE | Admit: 2016-03-05 | Discharge: 2016-03-05 | Disposition: A | Discharge status: Home / Self Care | Payer: Self-pay | Source: Ambulatory Visit | Attending: Family Medicine | Admitting: Family Medicine

## 2016-03-05 ENCOUNTER — Ambulatory Visit (INDEPENDENT_AMBULATORY_CARE_PROVIDER_SITE_OTHER): Payer: Self-pay | Admitting: Family Medicine

## 2016-03-05 VITALS — BP 130/62 | HR 87 | Temp 98.0°F | Wt 198.0 lb

## 2016-03-05 DIAGNOSIS — O163 Unspecified maternal hypertension, third trimester: Secondary | ICD-10-CM

## 2016-03-05 DIAGNOSIS — K219 Gastro-esophageal reflux disease without esophagitis: Secondary | ICD-10-CM | POA: Insufficient documentation

## 2016-03-05 DIAGNOSIS — Z3483 Encounter for supervision of other normal pregnancy, third trimester: Secondary | ICD-10-CM

## 2016-03-05 DIAGNOSIS — O133 Gestational [pregnancy-induced] hypertension without significant proteinuria, third trimester: Secondary | ICD-10-CM

## 2016-03-05 DIAGNOSIS — Z113 Encounter for screening for infections with a predominantly sexual mode of transmission: Secondary | ICD-10-CM | POA: Insufficient documentation

## 2016-03-05 LAB — CBC
HCT: 36 % (ref 35.0–45.0)
HEMOGLOBIN: 11.8 g/dL (ref 11.7–15.5)
MCH: 29.1 pg (ref 27.0–33.0)
MCHC: 32.8 g/dL (ref 32.0–36.0)
MCV: 88.9 fL (ref 80.0–100.0)
MPV: 10.2 fL (ref 7.5–12.5)
PLATELETS: 281 10*3/uL (ref 140–400)
RBC: 4.05 MIL/uL (ref 3.80–5.10)
RDW: 15.2 % — ABNORMAL HIGH (ref 11.0–15.0)
WBC: 9.9 10*3/uL (ref 3.8–10.8)

## 2016-03-05 LAB — POCT WET PREP (WET MOUNT)
CLUE CELLS WET PREP WHIFF POC: NEGATIVE
Trichomonas Wet Prep HPF POC: ABSENT

## 2016-03-05 MED ORDER — RANITIDINE HCL 150 MG PO TABS: 150.0000 mg | ORAL_TABLET | Freq: Two times a day (BID) | ORAL | 1 refills | Status: DC

## 2016-03-05 NOTE — Patient Instructions (Addendum)
Due date: 04/04/16  Tercer trimestre de Psychiatrist (Third Trimester of Pregnancy) El tercer trimestre comprende desde la semana29 hasta la semana42, es decir, desde el mes7 hasta el [address] El tercer trimestre es un perodo en el que el feto crece rpidamente. Hacia el final del noveno mes, el feto mide alrededor de 20pulgadas (45cm) de largo y pesa entre 6 y 10 libras (2,700 y 43,500kg). CAMBIOS EN EL ORGANISMO Su organismo atraviesa por muchos cambios durante el [place], y estos varan de Neomia Dear mujer a Educational psychologist.  Seguir [employer]. Es de esperar que aumente entre 25 y 35libras (11 y 16kg) hacia el final del Psychiatrist.  Podrn aparecer las primeras [employer] caderas, el abdomen y las [place].  Puede tener necesidad de Geographical information systems officer con ms frecuencia porque el feto baja hacia la pelvis y ejerce presin sobre la vejiga.  Debido al Vanetta Mulders podr sentir Anthoney Harada estomacal con frecuencia.  Puede estar estreida, ya que ciertas hormonas enlentecen los movimientos de los msculos que [employer] desechos a travs de los intestinos.  Pueden aparecer hemorroides o abultarse e hincharse las venas (venas varicosas).  Puede sentir dolor plvico debido al [place] y a que las hormonas del Management consultant las articulaciones entre los huesos de la pelvis. El dolor de espalda puede ser consecuencia de la sobrecarga de los msculos que soportan la [place].  Tal vez haya cambios en el cabello que pueden incluir su engrosamiento, crecimiento rpido y cambios en la textura. Adems, a algunas mujeres se les cae el cabello durante o despus del embarazo, o tienen el cabello seco o fino. Lo ms probable es que el cabello se le normalice despus del nacimiento del beb.  Las [employer] seguirn creciendo y Development worker, community. A veces, puede haber una secrecin amarilla de las mamas llamada calostro.  El ombligo puede salir hacia afuera.  Puede sentir que le falta el aire debido a que se expande el tero.  Puede  notar que el feto "baja" o lo siente ms bajo, en el abdomen.  Puede tener una prdida de secrecin mucosa con sangre. Esto suele ocurrir en el trmino de unos [address] a una semana antes de que comience el [place] de [place].  El cuello del tero se vuelve delgado y blando (se borra) cerca de la fecha de [place]. QU DEBE ESPERAR EN LOS EXMENES PRENATALES Le harn exmenes prenatales cada 2semanas hasta la semana36. A partir de ese momento le harn exmenes semanales. Durante una visita prenatal de rutina:  La pesarn para asegurarse de que usted y el feto estn creciendo normalmente.  Le tomarn la presin arterial.  Le medirn el abdomen para controlar el desarrollo del beb.  Se escucharn los latidos cardacos fetales.  Se evaluarn los resultados de los estudios solicitados en visitas anteriores.  Le revisarn el cuello del tero cuando est prxima la fecha de parto para controlar si este se ha borrado. Alrededor de la semana36, el mdico le revisar el cuello del tero. Al mismo tiempo, realizar un anlisis de las secreciones del tejido vaginal. Este examen es para determinar si hay un tipo de bacteria, estreptococo Grupo B. El mdico le explicar esto con ms detalle. El mdico puede preguntarle lo siguiente:  Cmo le gustara que fuera el [place].  Cmo se siente.  Si siente los movimientos del beb.  Si ha tenido sntomas anormales, como prdida de lquido, [place], dolores de cabeza intensos o clicos abdominales.  Si est consumiendo algn producto que contenga tabaco, como cigarrillos,  tabaco de mascar y cigarrillos electrnicos.  Si tiene Colgate-Palmolivealguna pregunta. Otros exmenes o estudios de deteccin que pueden realizarse durante el tercer trimestre incluyen lo siguiente:  Anlisis de sangre para controlar los niveles de hierro (anemia).  Controles fetales para determinar su salud, nivel de [place] and the Grenadinesactividad y Designer, jewellerycrecimiento. Si tiene Jerseyalguna enfermedad o hay problemas durante el  embarazo, le harn estudios.  Prueba del VIH (virus de inmunodeficiencia humana). Si corre Chiropodistun riesgo alto, pueden realizarle una prueba de deteccin del VIH durante el tercer trimestre del embarazo. FALSO TRABAJO DE PARTO Es posible que sienta contracciones leves e irregulares que finalmente desaparecen. Se llaman contracciones de 1000 Pine StreetBraxton Hicks o falso trabajo de [place]assetparto. Las Fifth Third Bancorpcontracciones pueden durar horas, [address] o incluso semanas, antes de que el verdadero trabajo de parto se inicie. Si las contracciones ocurren a intervalos regulares, se intensifican o se hacen dolorosas, lo mejor es que la revise el mdico. SIGNOS DE TRABAJO DE PARTO  Clicos de tipo menstrual.  Contracciones cada 5minutos o menos.  Contracciones que comienzan en la parte superior del tero y se extienden hacia abajo, a la zona inferior del abdomen y la espalda.  Sensacin de mayor presin en la pelvis o dolor de espalda.  Una secrecin de mucosidad acuosa o con sangre que sale de la vagina. Si tiene alguno de estos signos antes de la semana37 del Psychiatristembarazo, llame a su mdico de inmediato. Debe concurrir al hospital para que la controlen inmediatamente. INSTRUCCIONES PARA EL CUIDADO EN EL HOGAR  Evite fumar, consumir hierbas, beber alcohol y tomar frmacos que no le hayan recetado. Estas sustancias qumicas afectan la formacin y el desarrollo del beb.  No consuma ningn producto que contenga tabaco, lo que incluye cigarrillos, tabaco de Theatre managermascar y Administrator, Civil Servicecigarrillos electrnicos. Si necesita ayuda para dejar de fumar, consulte al American Expressmdico. Puede recibir asesoramiento y otro tipo de recursos para dejar de fumar.  Siga las indicaciones del mdico en relacin con el uso de medicamentos. Durante el embarazo, hay medicamentos que son seguros de tomar y otros que no.  Haga ejercicio solamente como se lo haya indicado el mdico. Sentir clicos uterinos es un buen signo para Restaurant manager, fast fooddetener la actividad fsica.  Contine comiendo alimentos  sanos con regularidad.  Use un sostn que le brinde buen soporte si le Altria Groupduelen las mamas.  No se d baos de inmersin en agua caliente, baos turcos ni saunas.  Use el cinturn de seguridad en todo momento mientras conduce.  No coma carne cruda ni queso sin cocinar; evite el contacto con las bandejas sanitarias de los gatos y la tierra que estos animales usan. Estos elementos contienen grmenes que pueden causar defectos congnitos en el beb.  Tome las vitaminas prenatales.  Tome entre 1500 y 2000mg  de calcio diariamente comenzando en la semana20 del embarazo [place]hasta el parto.  Si est estreida, pruebe un laxante suave (si el mdico lo autoriza). Consuma ms alimentos ricos en fibra, como vegetales y frutas frescos y Radiation protection practitionercereales integrales. Beba gran cantidad de lquido para mantener la orina de tono claro o color amarillo plido.  Dese baos de asiento con agua tibia para Engineer, materialsaliviar el dolor o las molestias causadas por las hemorroides. Use una crema para las hemorroides si el mdico la autoriza.  Si tiene venas varicosas, use medias de descanso. Eleve los pies durante 15minutos, 3 o 4veces por da. Limite el consumo de sal en su dieta.  Evite levantar objetos pesados, use zapatos de tacones bajos y Brazilmantenga una buena postura.  Descanse con las  piernas elevadas si tiene calambres o dolor de cintura.  Visite a su dentista si no lo ha Occupational hygienisthecho durante el embarazo. Use un cepillo de dientes blando para higienizarse los dientes y psese el hilo dental con suavidad.  Puede seguir Calpine Corporationmanteniendo relaciones sexuales, a menos que el mdico le indique lo contrario.  No haga viajes largos excepto que sea absolutamente necesario y solo con la autorizacin del mdico.  Tome clases prenatales para Financial traderentender, Education administratorpracticar y hacer preguntas sobre el Imogenetrabajo de parto y [place] parto.  Haga un ensayo de la partida al hospital.  Prepare el bolso que llevar al hospital.  Prepare la habitacin del beb.  Concurra  a todas las visitas prenatales segn las indicaciones de su mdico. SOLICITE ATENCIN MDICA SI:  No est segura de que est en trabajo de parto o de que ha roto la bolsa de las aguas.  Tiene mareos.  Siente clicos leves, presin en la pelvis o dolor persistente en el abdomen.  Tiene nuseas, vmitos o diarrea persistentes.  Brett Fairybserva una secrecin vaginal con mal olor.  Siente dolor al ConocoPhillipsorinar. SOLICITE ATENCIN MDICA DE INMEDIATO SI:  Tiene fiebre.  Tiene una prdida de lquido por la vagina.  Tiene sangrado o pequeas prdidas vaginales.  Siente dolor intenso o clicos en el abdomen.  Sube o baja de peso rpidamente.  Tiene dificultad para respirar y siente dolor de pecho.  Sbitamente se le hinchan mucho el rostro, las [place], los tobillos, los pies o las piernas.  No ha sentido los movimientos del beb durante Georgianne Fickuna hora.  Siente un dolor de cabeza intenso que no se alivia con medicamentos.  Su visin se modifica. Esta informacin no tiene Theme park managercomo fin reemplazar el consejo del mdico. Asegrese de hacerle al mdico cualquier pregunta que tenga. Document Released: 01/08/2005 Document Revised: 04/21/2014 Document Reviewed: 06/01/2012 Elsevier Interactive Patient Education  2017 [employer]vin[employer]vier Inc.

## 2016-03-05 NOTE — Progress Notes (Signed)
Tara [name] is a 35 y.o. [id number] at 7340w5d here for routine follow up.    She reports stomach burning.  For the last week, she has noticed a few streaks of blood in her vomit as well. She has not tried any medications for this.  She denies any headaches, blurred vision, right upper quadrant pain, lower extremity edema.  See flow sheet for details.  A/P: Pregnancy at 1740w5d.  Doing well.   Pregnancy issues include   1. Encounter for supervision of other normal pregnancy in third trimester - POCT Wet Prep [name] - Cervicovaginal ancillary only - Strep B DNA probe  2. Gastroesophageal reflux disease, esophagitis presence not specified - Start ranitidine 150 mg twice a day - Tums when necessary - Return precautions including increased bleeding discussed with patient  3. Elevated blood pressure affecting pregnancy in third trimester, antepartum - Not previously a problem for this patient, but she did have preeclampsia in a previous pregnancy - Asymptomatic - Check baseline labs today - COMPLETE METABOLIC PANEL WITH GFR - CBC - Protein / creatinine ratio, urine   Infant feeding choice: formula and breast Contraception choice: BTL papers signed  Tdap was not given today. Reports that she got this at [facility] and gc/chlamydia testing was performed today.    Preterm labor and fetal movement precautions reviewed. Safe sleep discussed. Follow up 1 week(s).  Erasmo DownerAngela M Bacigalupo, MD, MPH PGY-3,  [facility] 03/05/2016 3:19 PM

## 2016-03-06 LAB — PROTEIN / CREATININE RATIO, URINE
CREATININE, URINE: 247 mg/dL (ref 20–320)
Protein Creatinine Ratio: 385 mg/g creat — ABNORMAL HIGH (ref 21–161)
TOTAL PROTEIN, URINE: 95 mg/dL — AB (ref 5–24)

## 2016-03-06 LAB — COMPLETE METABOLIC PANEL WITH GFR
ALBUMIN: 3.4 g/dL — AB (ref 3.6–5.1)
ALK PHOS: 145 U/L — AB (ref 33–115)
ALT: 33 U/L — ABNORMAL HIGH (ref 6–29)
AST: 32 U/L — AB (ref 10–30)
BUN: 7 mg/dL (ref 7–25)
CALCIUM: 8.4 mg/dL — AB (ref 8.6–10.2)
CO2: 23 mmol/L (ref 20–31)
Chloride: 104 mmol/L (ref 98–110)
Creat: 0.53 mg/dL (ref 0.50–1.10)
GFR, Est Non African American: >89 mL/min (ref >=60)
Glucose, Bld: 103 mg/dL — ABNORMAL HIGH (ref 65–99)
POTASSIUM: 3.6 mmol/L (ref 3.5–5.3)
Sodium: 136 mmol/L (ref 135–146)
Total Bilirubin: 0.3 mg/dL (ref 0.2–1.2)
Total Protein: 6.4 g/dL (ref 6.1–8.1)

## 2016-03-06 LAB — STREP B DNA PROBE: GBSP: NOT DETECTED

## 2016-03-07 LAB — CERVICOVAGINAL ANCILLARY ONLY
CHLAMYDIA, DNA PROBE: NEGATIVE
Neisseria Gonorrhea: NEGATIVE

## 2016-03-12 ENCOUNTER — Ambulatory Visit (INDEPENDENT_AMBULATORY_CARE_PROVIDER_SITE_OTHER): Payer: Self-pay | Admitting: Internal Medicine

## 2016-03-12 ENCOUNTER — Telehealth: Payer: Self-pay

## 2016-03-12 VITALS — BP 124/75 | HR 86 | Temp 98.0°F | Wt 200.0 lb

## 2016-03-12 DIAGNOSIS — O163 Unspecified maternal hypertension, third trimester: Secondary | ICD-10-CM

## 2016-03-12 DIAGNOSIS — O133 Gestational [pregnancy-induced] hypertension without significant proteinuria, third trimester: Secondary | ICD-10-CM

## 2016-03-12 DIAGNOSIS — Z3483 Encounter for supervision of other normal pregnancy, third trimester: Secondary | ICD-10-CM

## 2016-03-12 LAB — CBC
HCT: 35.1 % (ref 35.0–45.0)
Hemoglobin: 11.6 g/dL — ABNORMAL LOW (ref 11.7–15.5)
MCH: 29 pg (ref 27.0–33.0)
MCHC: 33 g/dL (ref 32.0–36.0)
MCV: 87.8 fL (ref 80.0–100.0)
MPV: 9.8 fL (ref 7.5–12.5)
PLATELETS: 271 10*3/uL (ref 140–400)
RBC: 4 MIL/uL (ref 3.80–5.10)
RDW: 15.3 % — AB (ref 11.0–15.0)
WBC: 9.2 10*3/uL (ref 3.8–10.8)

## 2016-03-12 LAB — COMPLETE METABOLIC PANEL WITH GFR
ALT: 29 U/L (ref 6–29)
AST: 32 U/L — AB (ref 10–30)
Albumin: 3.3 g/dL — ABNORMAL LOW (ref 3.6–5.1)
Alkaline Phosphatase: 143 U/L — ABNORMAL HIGH (ref 33–115)
BUN: 9 mg/dL (ref 7–25)
CHLORIDE: 105 mmol/L (ref 98–110)
CO2: 21 mmol/L (ref 20–31)
CREATININE: 0.51 mg/dL (ref 0.50–1.10)
Calcium: 8.5 mg/dL — ABNORMAL LOW (ref 8.6–10.2)
GFR, Est Non African American: >89 mL/min (ref >=60)
GLUCOSE: 97 mg/dL (ref 65–99)
Potassium: 4 mmol/L (ref 3.5–5.3)
SODIUM: 134 mmol/L — AB (ref 135–146)
Total Bilirubin: 0.2 mg/dL (ref 0.2–1.2)
Total Protein: 6.5 g/dL (ref 6.1–8.1)

## 2016-03-12 NOTE — Patient Instructions (Addendum)
I want you to come back in nurse visit to have your blood pressure rechecked tomorrow. If you have any headache please go to the MAU to get your blood pressure checked. I want you to follow up in 1 week for your next prenatal visit. I may need to contact you earlier to send you to another physician   Tercer trimestre de Psychiatrist (Third Trimester of Pregnancy) El tercer trimestre comprende desde la semana29 hasta la semana42, es decir, desde el mes7 hasta el mes9. El tercer trimestre es un perodo en el que el feto crece rpidamente. Hacia el final del noveno mes, el feto mide alrededor de 20pulgadas (45cm) de largo y pesa entre 6 y 10 libras (2,700 y 46,500kg). CAMBIOS EN EL ORGANISMO Su organismo atraviesa por muchos cambios durante el [place], y estos varan de Neomia Dear mujer a Educational psychologist.  Seguir [employer] Companies. Es de esperar que aumente entre 25 y 35libras (11 y 16kg) hacia el final del Psychiatrist.  Podrn aparecer las primeras Albertson's caderas, el abdomen y las [place].  Puede tener necesidad de Geographical information systems officer con ms frecuencia porque el feto baja hacia la pelvis y ejerce presin sobre la vejiga.  Debido al Vanetta Mulders podr sentir Anthoney Harada estomacal con frecuencia.  Puede estar estreida, ya que ciertas hormonas enlentecen los movimientos de los msculos que [employer] desechos a travs de los intestinos.  Pueden aparecer hemorroides o abultarse e hincharse las venas (venas varicosas).  Puede sentir dolor plvico debido al [employer]-way y a que las hormonas del Management consultant las articulaciones entre los huesos de la pelvis. El dolor de espalda puede ser consecuencia de la sobrecarga de los msculos que soportan la [place].  Tal vez haya cambios en el cabello que pueden incluir su engrosamiento, crecimiento rpido y cambios en la textura. Adems, a algunas mujeres se les cae el cabello durante o despus del embarazo, o tienen el cabello seco o fino. Lo ms probable es que el cabello se le  normalice despus del nacimiento del beb.  Las ConAgra Foods seguirn creciendo y Development worker, community. A veces, puede haber una secrecin amarilla de las mamas llamada calostro.  El ombligo puede salir hacia afuera.  Puede sentir que le falta el aire debido a que se expande el tero.  Puede notar que el feto "baja" o lo siente ms bajo, en el abdomen.  Puede tener una prdida de secrecin mucosa con sangre. Esto suele ocurrir en el trmino de unos [address] a una semana antes de que comience el [place].  El cuello del tero se vuelve delgado y blando (se borra) cerca de la fecha de [place]. QU DEBE ESPERAR EN LOS EXMENES PRENATALES Le harn exmenes prenatales cada 2semanas hasta la semana36. A partir de ese momento le harn exmenes semanales. Durante una visita prenatal de rutina:  La pesarn para asegurarse de que usted y el feto estn creciendo normalmente.  Le tomarn la presin arterial.  Le medirn el abdomen para controlar el desarrollo del beb.  Se escucharn los latidos cardacos fetales.  Se evaluarn los resultados de los estudios solicitados en visitas anteriores.  Le revisarn el cuello del tero cuando est prxima la fecha de parto para controlar si este se ha borrado. Alrededor de la semana36, el mdico le revisar el cuello del tero. Al mismo tiempo, realizar un anlisis de las secreciones del tejido vaginal. Este examen es para determinar si hay un tipo de bacteria, estreptococo Grupo B. El Curator esto  con ms detalle. El mdico puede preguntarle lo siguiente:  Cmo le gustara que fuera el Hardingparto.  Cmo se siente.  Si siente los movimientos del beb.  Si ha tenido sntomas anormales, como prdida de lquido, Nazareth Collegesangrado, dolores de cabeza intensos o clicos abdominales.  Si est consumiendo algn producto que contenga tabaco, como cigarrillos, tabaco de Theatre managermascar y Administrator, Civil Servicecigarrillos electrnicos.  Si tiene Colgate-Palmolivealguna pregunta. Otros exmenes o estudios de  deteccin que pueden realizarse durante el tercer trimestre incluyen lo siguiente:  Anlisis de sangre para controlar los niveles de hierro (anemia).  Controles fetales para determinar su salud, nivel de [place] the Grenadinesactividad y Designer, jewellerycrecimiento. Si tiene Jerseyalguna enfermedad o hay problemas durante el embarazo, le harn estudios.  Prueba del VIH (virus de inmunodeficiencia humana). Si corre Chiropodistun riesgo alto, pueden realizarle una prueba de deteccin del VIH durante el tercer trimestre del embarazo. FALSO TRABAJO DE PARTO Es posible que sienta contracciones leves e irregulares que finalmente desaparecen. Se llaman contracciones de [address] Pine StreetBraxton Hicks o falso trabajo de Clymanparto. Las Fifth Third Bancorpcontracciones pueden durar horas, [address]das o incluso semanas, antes de que el verdadero trabajo de parto se inicie. Si las contracciones ocurren a intervalos regulares, se intensifican o se hacen dolorosas, lo mejor es que la revise el mdico. SIGNOS DE TRABAJO DE PARTO  Clicos de tipo menstrual.  Contracciones cada 5minutos o menos.  Contracciones que comienzan en la parte superior del tero y se extienden hacia abajo, a la zona inferior del abdomen y la espalda.  Sensacin de mayor presin en la pelvis o dolor de espalda.  Una secrecin de mucosidad acuosa o con sangre que sale de la vagina. Si tiene alguno de estos signos antes de la semana37 del Psychiatristembarazo, llame a su mdico de inmediato. Debe concurrir al hospital para que la controlen inmediatamente. INSTRUCCIONES PARA EL CUIDADO EN EL HOGAR  Evite fumar, consumir hierbas, beber alcohol y tomar frmacos que no le hayan recetado. Estas sustancias qumicas afectan la formacin y el desarrollo del beb.  No consuma ningn producto que contenga tabaco, lo que incluye cigarrillos, tabaco de Theatre managermascar y Administrator, Civil Servicecigarrillos electrnicos. Si necesita ayuda para dejar de fumar, consulte al American Expressmdico. Puede recibir asesoramiento y otro tipo de recursos para dejar de fumar.  Siga las indicaciones del mdico en  relacin con el uso de medicamentos. Durante el embarazo, hay medicamentos que son seguros de tomar y otros que no.  Haga ejercicio solamente como se lo haya indicado el mdico. Sentir clicos uterinos es un buen signo para Restaurant manager, fast fooddetener la actividad fsica.  Contine comiendo alimentos sanos con regularidad.  Use un sostn que le brinde buen soporte si le Altria Groupduelen las mamas.  No se d baos de inmersin en agua caliente, baos turcos ni saunas.  Use el cinturn de seguridad en todo momento mientras conduce.  No coma carne cruda ni queso sin cocinar; evite el contacto con las bandejas sanitarias de los gatos y la tierra que estos animales usan. Estos elementos contienen grmenes que pueden causar defectos congnitos en el beb.  Tome las vitaminas prenatales.  Tome entre 1500 y 2000mg  de calcio diariamente comenzando en la semana20 del embarazo [place] Havenhasta el parto.  Si est estreida, pruebe un laxante suave (si el mdico lo autoriza). Consuma ms alimentos ricos en fibra, como vegetales y frutas frescos y Radiation protection practitionercereales integrales. Beba gran cantidad de lquido para mantener la orina de tono claro o color amarillo plido.  Dese baos de asiento con agua tibia para Engineer, materialsaliviar el dolor o las molestias causadas por las hemorroides.  Use una crema para las hemorroides si el mdico la autoriza.  Si tiene venas varicosas, use medias de descanso. Eleve los pies durante 15minutos, 3 o 4veces por da. Limite el consumo de sal en su dieta.  Evite levantar objetos pesados, use zapatos de tacones bajos y Brazilmantenga una buena postura.  Descanse con las piernas elevadas si tiene calambres o dolor de cintura.  Visite a su dentista si no lo ha Occupational hygienisthecho durante el embarazo. Use un cepillo de dientes blando para higienizarse los dientes y psese el hilo dental con suavidad.  Puede seguir Calpine Corporationmanteniendo relaciones sexuales, a menos que el mdico le indique lo contrario.  No haga viajes largos excepto que sea absolutamente  necesario y solo con la autorizacin del mdico.  Tome clases prenatales para Financial traderentender, Education administratorpracticar y hacer preguntas sobre el Gilmertrabajo de parto y [place]el parto.  Haga un ensayo de la partida al hospital.  Prepare el bolso que llevar al hospital.  Prepare la habitacin del beb.  Concurra a todas las visitas prenatales segn las indicaciones de su mdico. SOLICITE ATENCIN MDICA SI:  No est segura de que est en trabajo de parto o de que ha roto la bolsa de las aguas.  Tiene mareos.  Siente clicos leves, presin en la pelvis o dolor persistente en el abdomen.  Tiene nuseas, vmitos o diarrea persistentes.  Brett Fairybserva una secrecin vaginal con mal olor.  Siente dolor al ConocoPhillipsorinar. SOLICITE ATENCIN MDICA DE INMEDIATO SI:  Tiene fiebre.  Tiene una prdida de lquido por la vagina.  Tiene sangrado o pequeas prdidas vaginales.  Siente dolor intenso o clicos en el abdomen.  Sube o baja de peso rpidamente.  Tiene dificultad para respirar y siente dolor de pecho.  Sbitamente se le hinchan mucho el rostro, las Blue Ridge Shoresmanos, los tobillos, los pies o las piernas.  No ha sentido los movimientos del beb durante Georgianne Fickuna hora.  Siente un dolor de cabeza intenso que no se alivia con medicamentos.  Su visin se modifica. Esta informacin no tiene Theme park managercomo fin reemplazar el consejo del mdico. Asegrese de hacerle al mdico cualquier pregunta que tenga. Document Released: 01/08/2005 Document Revised: 04/21/2014 Document Reviewed: 06/01/2012 Elsevier Interactive Patient Education  2017 ArvinMeritorElsevier Inc.

## 2016-03-12 NOTE — Telephone Encounter (Signed)
Called patient via [employer] (Fernanda-#[id number])  Patient did not answer so a message was left informing patienet of US MFM Fetal BPP WO Stress.  The first appointment is on 14 March 2016 @ 0945 with a showtime at 0930.  The second appointment is 8 December at 0800 with a showtime of 0745.  These appointments are at [facility]. Directions were given to enter the main entrance going past the gift shop on the left.  Patient was advised that children are not allowed during the procedure.Chairty Toman R  

## 2016-03-12 NOTE — Telephone Encounter (Signed)
Called patient via WellPointPacific [employer]pre[employer] [phone](Fernanda-#[id number])  Patient did not answer so a message was left informing patienet of US MFM Fetal BPP WO Stress.  The first appointment is on 14 March 2016 @ 0945 with a showtime at 0930.  The second appointment is 8 December at 0800 with a showtime of 0745.  [facility]. Directions were given to enter the main entrance going past the gift shop on the left.  Patient was advised that children are not allowed during the procedure.Glennie HawkSimpson, Michelle R

## 2016-03-12 NOTE — Progress Notes (Signed)
Tara [name] is a 35 y.o. [id number] at 5129w5d for routine follow up.  She reports no vaginal bleeding, no contractions, no loss of fluid. + fetal movement.   Denies right upper quadrant pain, lower extremity edema  Indicates having headache. Has had 3 headaches, last about two hours. Tylenol resolves these headaches. Denies having headaches throughout the pregnancy. Headaches started just this week. She also states that she has blurry vision at times when she has these headaches. Hx of preeclampsia  With the other two pregnancies, patient indicates having high blood pressure. Denies any headache currently.    See flow sheet for details.  A/P: Pregnancy at 6629w5d.  Doing well.   Pregnancy issues include with reflux at last visit, started on ranitidine at last visit Denies any issues burning sensation at this visit. Blood pressure was elevated at last visit, and baseline labs were obtained for preeclampsia.   BP 124/75  Physical Exam  No Right upper quadrant pain to palpation  No lower extremity edema noted on exam  Uterine size 38 cm  FHR: 140 bpm   Infant feeding choice: formula and breast Contraception choice: BTL papers signed Infant circumcision desired: no   Flu and Tdapwas given HD  GBS/GC/CZ testing was reviewed today.  Preterm labor precautions reviewed. Provided precautions for going to MAU including  RUQ pain, headaches or blurry vision  Follow up tomorrow for recheck of blood pressure at nursing visit   Discussed patient with Dr. Macon LargeAnyanwu. She states that patient can continue care at our office for now. However patient will need BPP x2 at 37 and 38 weeks. Patient will need to be induced at 39 weeks.   Safe sleep discussed. Kick counts reviewed. Follow up 1 weeks.

## 2016-03-13 ENCOUNTER — Other Ambulatory Visit: Payer: Self-pay | Admitting: Family Medicine

## 2016-03-13 ENCOUNTER — Telehealth: Payer: Self-pay | Admitting: Internal Medicine

## 2016-03-13 DIAGNOSIS — O163 Unspecified maternal hypertension, third trimester: Secondary | ICD-10-CM

## 2016-03-13 LAB — PROTEIN / CREATININE RATIO, URINE
Creatinine, Urine: 123 mg/dL (ref 20–320)
PROTEIN CREATININE RATIO: 293 mg/g{creat} — AB (ref 21–161)
TOTAL PROTEIN, URINE: 36 mg/dL — AB (ref 5–24)

## 2016-03-13 NOTE — Telephone Encounter (Signed)
Please call patient, she was suppose to come in for a blood pressure check today and follow up for prenatal appointment in 1 week. She has not made either one of these appointments. Furthermore, she needs to be informed of [facility]

## 2016-03-14 ENCOUNTER — Telehealth: Payer: Self-pay | Admitting: Family Medicine

## 2016-03-14 ENCOUNTER — Telehealth: Payer: Self-pay | Admitting: *Deleted

## 2016-03-14 ENCOUNTER — Ambulatory Visit (HOSPITAL_COMMUNITY)
Admission: RE | Admit: 2016-03-14 | Payer: Self-pay | Source: Ambulatory Visit | Attending: Family Medicine | Admitting: Family Medicine

## 2016-03-14 NOTE — Telephone Encounter (Signed)
Patient did not show to today's [facility] Attempted to reach patient via [address], Jari FavreOscar ID [phone], to discuss no show and to schedule BP check and one week OB f/u per Dr. Cathlean CowerMikell. There was no answer. VM left requesting return call.  Kinnie FeilL. Lorenzo Pereyra, RN, BSN

## 2016-03-14 NOTE — Telephone Encounter (Signed)
On November 29, I asked the Patient for the [employer]opt [employer] Mom Contract and she replied to bring it later; Adopt a Mom Interpeter Noelia witnessed it. I emailed Aggie HackerLeandra Vernon ([employer]opt [employer] Mom Patient Coordinator) and she replied patient had a contract with them.  I called patient to make an appointment for Wednesday December 6 but I only could leave messages. On November 30, Noelia brought copy of the Adopt a Mom Contract and I scanned in the system. Noelia made the comment that Patient looked stressed because she told her her child's  sometimes  I called Patient at least twice as I have recorded in phone message book but I could leave messages only.  I have contact her friend Bradly ChrisKathy Rodriguez [phone]) and asked to call her because we need to talk with patient. I called again, and her friend who is her cousin said she didn't answer and she is not connected on facebook either. I asked if she can go to her home but she doesn't know the address and ask for it. However, I can't disclose any information. Nurse Alecia LemmingLauren Ducatte is calling the police and I found in the Adopt a Mom Contract another address.

## 2016-03-14 NOTE — Telephone Encounter (Signed)
Called back to [place] [employer]. There was no response at either address. Called Leandra at [employer] [phone]) explained the situation and importance of patient having her BP checked. Juanna CaoLeandra will attempt to contact patient and will let us know results.  Kinnie FeilL. Ducatte, RN, BSN

## 2016-03-14 NOTE — Telephone Encounter (Signed)
After several attempts to contact Patient, I called her friend again and she said patient is working and her phone doesn't get signal there, but she is feeling better. I scheduled two appointments: Monday and Wednesday. Also, I told patient's friend in the event patient's blood pressure is high to get help ASAP because we don't open during the weekend.

## 2016-03-14 NOTE — Telephone Encounter (Signed)
Spoke with Juanna CaoLeandra who stated she was not able to reach patient by phone but will go by patient's house shortly to check on her.  Kinnie FeilL. Brittin Janik, RN, BSN

## 2016-03-14 NOTE — Telephone Encounter (Addendum)
Learned that receptionist made two attempts yesterday to contact patient and VMs were left both times requesting return call. Patient has not returned any calls. Discussed with PCP, Dr. Beryle FlockBacigalupo, and [facility] called to request welfare check as patient is [redacted] weeks pregnant with elevated blood pressures and is unreachable. Spoke with [facility] with [employer] and two addresses given: [address] [phone] and [address] [place] [phone]. Elita QuickJose stated someone will check these two addresses and to call back in 60-90 minutes to see if patient has been located.  Emphasized patient is Spanish speaker. Kinnie FeilL. Toussaint Golson, RN, BSN   Called [employer] back and spoke with Operator, Sam, requesting officers let patient know how important it is she contact our office-- can just walk in and we will check her BP.  Kinnie FeilL. Maheen Cwikla, RN, BSN

## 2016-03-17 ENCOUNTER — Telehealth: Payer: Self-pay | Admitting: Family Medicine

## 2016-03-17 ENCOUNTER — Ambulatory Visit (INDEPENDENT_AMBULATORY_CARE_PROVIDER_SITE_OTHER): Payer: Self-pay | Admitting: Family Medicine

## 2016-03-17 ENCOUNTER — Encounter: Payer: Self-pay | Admitting: Family Medicine

## 2016-03-17 VITALS — BP 132/80 | HR 81 | Temp 97.9°F | Ht 65.0 in | Wt 199.4 lb

## 2016-03-17 DIAGNOSIS — O133 Gestational [pregnancy-induced] hypertension without significant proteinuria, third trimester: Secondary | ICD-10-CM

## 2016-03-17 DIAGNOSIS — O163 Unspecified maternal hypertension, third trimester: Secondary | ICD-10-CM

## 2016-03-17 DIAGNOSIS — Z3483 Encounter for supervision of other normal pregnancy, third trimester: Secondary | ICD-10-CM

## 2016-03-17 NOTE — Telephone Encounter (Signed)
Form completed by Dr. Genevie AnnSchenk, original given to patient and copy placed in to be scanned pile.

## 2016-03-17 NOTE — Telephone Encounter (Signed)
On 03-12-16, after her appointment Patient said she will come back with the Adopt a Mom Contract and I replied I will give her an appointment for next week, patient agreed. However, she told her friend she was denied an appointment. During the ne

## 2016-03-17 NOTE — Telephone Encounter (Signed)
During the next two days, I call patient but she didn't answer the phone.

## 2016-03-17 NOTE — Telephone Encounter (Signed)
Work form dropped off for at front desk for completion.  Verified that patient section of form has been completed.  Last DOS with PCP was 12-04-217.  Placed form in team folder to be completed by clinical staff.  Tara [name]

## 2016-03-17 NOTE — Progress Notes (Signed)
[facility]  Tara [name] is a 35 y.o. [phone] at 4159w3d (via LMP=21w u/s) who presents to for routine follow up. Prenatal course, history, notes, ultrasounds, and laboratory results reviewed. Spanish interpreter utilized during today's visit.   Denies cramping/ctx, fluid leaking, vaginal bleeding, or decreased fetal movement. Notes this baby moves less than her other two children, but baby is still moving normally for him. notices increased movement sometimes, less at others, but always moving. Taking PNV.    Seen here last week with concern for developing gestational hypertension or pre-eclampsia. Summary of visits: 11/22 - BP 130/62, urine protein:creatinine 0.385 11/29 - BP 124/75, urine protein:creatinine 0.293 Other PIH labs obtained last week unremarkable.  Case was discussed with Tara [name] last week who recommended weekly BPP at 37 and 38 weeks, with plan for induction at 39 weeks. Had BPP scheduled for Friday 12/1 but patient no-showed, did also not come for repeat blood pressure check as instructed. Patient reports being told she could not schedule visit until she brought in her adopt a mom contract - I will look into this.  Patient denies any headache. Having occasional blurry vision about once per day, lasts 5 minutes at a time and spontaneously resolves. No abdominal pain or swelling. Does note that she feels thirsty.  Our clinic tried multiple times to reach her last week after [facility]. Patient reports she did not have good cell service and also has a new cell phone number. Is not sure of the # but has phone in her car, can provide it to us before leaving today.  Primary Prenatal Care Provider: Bacigalupo  Prenatal Course: - ultrasounds:   8/12 anatomy scan - needed f/u growth & anatomical survey in 4 weeks  10/12 [facility] - normal  & concordant growth - diabetes screening: passed 3 hour gtt - prenatal labs: rubella immune, GBS neg, g/chl neg, normal pap reportedly in prior pregnancy - genetic screening: too late (presented for care at 637w5d) - MAU visits: [facility], none since - vaccinations: received flu & Tdap vaccines per records - weight gain: up 7lb since 26w initial visit. No pregravid weight on file for comparison  OB/Medical/Surgical/Family History: - OB:   #1 - 2005, born at ~37w [place], did not have trial of labor, went straight for c/s after blood pressure noted high. Weight around 8lb, girl, now doing well  #2 - 2015 - medical TAB at around 4-5 weeks  #3 - 2016 - VBAC, only issue during that preg IOL for chronic hypertension (appears blood pressures were normal throughout pregnancy off medications until 30w, started labetalol at 37w) - Medical Hx: none. No prior history of HSV or partners with HSV. Reported history of "chronic hypertension" however thus far in the pregnancy all blood pressures have been normal while off medications. Dx is unclear. - Surgical: none - Family: mom has diabetes, otherwise negative  Postpartum Plans: - Contraception: wants tubal if she has to have a cesarean, otherwise contemplating nexplanon - Feeding: both breast & bottle - Circumcision: having a female, declines circumcision - [facility]other kids [facility] - Delivery planning: plan for TOLAC, has completed counseling visit with Tara [name]  Exam: Initial BP 137/84, repeat 132/80 Gen: NAD, pleasant, cooperative Abd: gravid but otherwise soft, nontender to palpation Ext:  no appreciable lower extremity edema bilaterally Neuro: grossly nonfocal, speech intact, no clonus Presentation: Vertex, confirmed with bedside ultrasound FHR 135. Fundus 39cm  Assessment/Plan:  1. Proteinuria of pregnancy with borderline BP's - current BP's do not support  dx of pre-eclampsia, however are borderline and require close monitoring. Discussed case on phone with OB faculty Tara [name]. We developed the following plan: - Frequent BP checks - will return in 2 days [name], also plan to recheck BP again on Friday - If BP is >140/90, should obtain new PIH labs at that time (cannot just count elevated UP:C from 11/22 as it went down on 11/29) - If BP consistently over 140/90 on multiple checks several hours apart, this is an indication for induction of labor (now inducing gHTN at 37 weeks). - Needs NST and BPP this week - has BPP scheduled for Friday. Will need to also arrange NST.  Next prenatal visit in 2 days as scheduled. Labor & fetal movement precautions discussed. PIH precautions reviewed at length with patient. Patient instructed to provide us with her updated phone number prior to leaving today.  Tara FeinsteinBrittany Meri Pelot, MD [facility]

## 2016-03-17 NOTE — Patient Instructions (Addendum)
If you have any contractions which occur 7-10 minutes apart, vaginal bleeding, fluid leaking, or are worried that baby is not moving well, go immediately to [facility] to be evaluated.   Also go to MAU for any blurry vision, headache, abdominal pain, or swelling.  Follow up with us Wednesday and Friday of this week. Also have special ultrasound scheduled on Friday.  Be well, Dr. Pollie MeyerMcIntyre    Hipertensin durante el embarazo (Hypertension During Pregnancy) Cuando se sufre hipertensin arterial o presin arterial alta, existe una presin extra en el interior de los vasos sanguneos que llevan la sangre desde el corazn al resto del cuerpo (arterias). Esto puede suceder en cualquier etapa de la vida, incluido el embarazo. La hipertensin durante el embarazo puede causar problemas para usted y el beb. Es posible que el beb no tenga el peso adecuado al nacer o puede que nazca antes de tiempo (prematuro). En los casos muy graves de hipertensin durante el embarazo puede estar en peligro la vida.  Durante el embarazo se pueden presentar diferentes tipos de hipertensin arterial. Estos incluyen:  Hipertensin crnica. Esto sucede cuando una mujer sufre de hipertensin arterial antes del Psychiatristembarazo y Careers information officercontina durante el este.  Hipertensin gestacional. Es cuando se desarrolla la hipertensin Academic librariandurante el embarazo.  Preeclampsia o toxemia del embarazo. Es un tipo muy grave de hipertensin que se desarrolla solo durante el [place]embarazo. Afecta a todo el cuerpo y puede ser muy peligrosa para la madre y el beb. La hipertensin gestacional y preeclampsia por lo general, desaparecen despus del nacimiento del beb. La presin arterial probablemente se estabilizar en un perodo de 6 semanas. Las mujeres que sufren de hipertensin durante el embarazo tienen una mayor probabilidad de Environmental education officerdesarrollar hipertensin en etapas posteriores de la vida o en embarazos futuros. FACTORES DE RIESGO Existen ciertos factores que  aumentan las probabilidades de que desarrolle hipertensin durante el [place]embarazo. Estos incluyen:  Tener hipertensin arterial antes del embarazo.  Haber sufrido hipertensin arterial durante un embarazo anterior.  Tener sobrepeso.  Ser mayor de 40 aos.  Estar embarazada de ms de un beb.  Tener diabetes o problemas renales. SIGNOS Y SNTOMAS La hipertensin arterial gestacional y crnica en raras ocasiones provoca sntomas. La preeclampsia causa sntomas, que pueden ser:  Aumento de las protenas en la orina. El Facilities managermdico la controlar en cada visita prenatal.  Hinchazn de las manos y la cara.  Aumento rpido de Leisure Lakepeso.  Dolores de Turkmenistancabeza.  Cambios en la visin.  Molestias al ver la luz.  Dolor abdominal, especialmente en el rea superior derecha.  Dolor en el pecho.  Falta de aire.  Aumento de los reflejos.  Convulsiones. Las convulsiones ocurren en una forma ms grave de preeclampsia, llamada eclampsia. DIAGNSTICO  Es posible que se le diagnostique hipertensin arterial durante un control prenatal regular. En cada visita prenatal, es posible que le realicen los siguientes exmenes:  Control de la presin arterial.  Anlisis de orina para detectar protenas en la orina. El tipo de hipertensin que se diagnostica depende del momento en que se desarroll. Tambin depende de la lectura de su presin arterial especfica.  El desarrollo de hipertensin arterial antes de lasemana 20 de embarazo es consecuente con la hipertensin arterial crnica.  El desarrollo de hipertensin arterial despus de la semana 20 de embarazo es consecuente con la hipertensin gestacional.  Hipertensin con aumento de la protena urinaria se diagnostica como preeclampsia.  Las mediciones de la presin arterial de ms de 160 sistlica o 110 diastlica son  un signo de preeclampsia grave. TRATAMIENTO El tratamiento para la hipertensin durante el embarazo vara. Depende del tipo de hipertensin  y de su gravedad.  Si toma medicamentos para la hipertensin crnica, puede que tenga que cambiarlos.  Los medicamentos llamados inhibidores de la ECA no deben tomarse Academic librariandurante el embarazo.  Para las mujeres que tienen factores de riesgo de preeclampsia pueden recomendarse bajas dosis de aspirina.  Si usted tiene Occupational hygienisthipertensin gestacional, tendr que tomar un medicamento para la presin arterial que sea seguro durante el [place]embarazo. El Market researchermdico le recomendar el medicamento apropiado.  Si tiene preeclampsia grave, es posible que tenga que Engineer, maintenancepermanecer en el hospital. Los mdicos la controlarn a usted y al beb muy de cerca. Probablemente deba tomar un medicamento denominado sulfato de magnesio para prevenir las convulsiones y reducir la presin arterial.  A veces es necesario inducir un parto prematuro. Por ejemplo, si la afeccin empeora. Se hace para protegerlos a usted y a su beb. La nica cura para la preeclampsia es el parto.  Su mdico puede recomendarle que tome una aspirina de dosis baja (81mg ) cada da, a fin de ayudar a prevenir la hipertensin durante el embarazo, si est en riesgo de padecer preeclampsia. Puede estar en riesgo de padecer preeclampsia si:  Padeci preeclampsia o eclampsia durante un embarazo anterior.  Su beb no creci segn lo previsto durante un embarazo anterior.  Tuvo un parto prematuro en un embarazo anterior.  Experiment una separacin de la placenta desde el tero (desprendimiento abrupto de la placenta) durante un embarazo anterior.  Perdi un beb en un embarazo anterior.  Est embarazada de ms de un beb.  Padece otras afecciones mdicas, como diabetes o una enfermedad autoinmunitaria. INSTRUCCIONES PARA EL CUIDADO EN EL HOGAR  Programe y concurra a todas las citas de control prenatal regulares. Esto es importante.  Tome los medicamentos solamente como se lo haya indicado el mdico. Dgale a su mdico todos los medicamentos que toma.  Consuma la  menor cantidad posible de sal.  Realice actividad fsica con regularidad.  No beba alcohol.  No fume ni use productos que contengan tabaco.  No beba productos con cafena.  Acustese sobre el lado izquierdo cuando haga reposo. SOLICITE ATENCIN MDICA DE INMEDIATO SI:  Siente un dolor abdominal intenso.  Presenta hinchazn repentina en las manos, los tobillos o el rostro.  Aumenta ms de 4 libras (1,8 kg) en una semana.  Vomita repetidas veces.  Tiene una hemorragia vaginal abundante.  No siente que el beb se mueva mucho.  Tiene dolores de Turkmenistancabeza.  Tiene visin doble o borrosa.  Tiene calambres o espasmos musculares.  Le falta el aire.  Tiene los labios y las uas de los dedos de las manos de Edison Internationalcolor azul.  Observa sangre en la orina. ASEGRESE DE QUE:  Comprende estas instrucciones.  Controlar su afeccin.  Recibir ayuda de inmediato si no mejora o si empeora. Esta informacin no tiene Theme park managercomo fin reemplazar el consejo del mdico. Asegrese de hacerle al mdico cualquier pregunta que tenga. Document Released: 03/20/2011 Document Revised: 04/21/2014 Elsevier Interactive Patient Education  2017 [employer]

## 2016-03-18 ENCOUNTER — Encounter: Payer: Self-pay | Admitting: Family Medicine

## 2016-03-18 ENCOUNTER — Telehealth: Payer: Self-pay | Admitting: Family Medicine

## 2016-03-18 NOTE — Telephone Encounter (Signed)
Called MFM ultrasound department to ensure patient is scheduled for both BPP AND NST on Friday. Currently was scheduled just for BPP. They were graciously able to add her for an NST as well.  Her new appointment times are: 12/8 8:45a NST 12/8 9:45a BPP  Spoke with PCP Dr. Beryle FlockBacigalupo to advise of these new appts. She will inform patient of new appointment times during her visit tomorrow.   Latrelle DodrillBrittany J Catherine Cubero, MD

## 2016-03-19 ENCOUNTER — Ambulatory Visit (INDEPENDENT_AMBULATORY_CARE_PROVIDER_SITE_OTHER): Payer: Self-pay | Admitting: Family Medicine

## 2016-03-19 VITALS — BP 130/82 | HR 85 | Temp 97.9°F | Wt 201.4 lb

## 2016-03-19 DIAGNOSIS — O133 Gestational [pregnancy-induced] hypertension without significant proteinuria, third trimester: Secondary | ICD-10-CM

## 2016-03-19 DIAGNOSIS — Z3483 Encounter for supervision of other normal pregnancy, third trimester: Secondary | ICD-10-CM

## 2016-03-19 DIAGNOSIS — O163 Unspecified maternal hypertension, third trimester: Secondary | ICD-10-CM

## 2016-03-19 NOTE — Progress Notes (Signed)
Recheck BP 130/72. Deseree Bruna PotterBlount, CMA

## 2016-03-19 NOTE — Progress Notes (Signed)
Tara [name] is a 35 y.o. [id number] at 6762w5d  (via LMP=21w u/s) here for routine follow up.  She reports she is feeling well.  History taken with help of Spanish interpreter Caryn BeeKevin [id number]. See flow sheet for details.  A/P: Pregnancy at 1562w5d. Doing well.   Pregnancy issues include borderline elevated blood pressures and concern for early preeclampsia. Summary of visits: 11/22 - BP 130/62, urine protein:creatinine 0.385 11/29 - BP 124/75, urine protein:creatinine 0.293 Other PIH labs obtained 11/29 unremarkable.  Case was discussed with Dr. Macon LargeAnyanwu last week who recommended weekly BPP at 37 and 38 weeks, with plan for induction at 39 weeks. Had BPP scheduled for Friday 12/1 but patient no-showed, did also not come for repeat blood pressure check as instructed.   Patient denies any headaches, changes in vision, abdominal pain, lower extremity edema.  Infant feeding choice: Breast and bottle Contraception choice: Nexplanon Infant circumcision desired: no - [facility]other kids [facility] - Delivery planning: plan for TOLAC, has completed counseling visit with Dr. Shawnie PonsPratt  Assessment/plan 1. Proteinuria of pregnancy with borderline BP's - current BP's do not support dx of pre-eclampsia, however are borderline and require close monitoring. Case previously discussed with OB faculty Dr. Adrian BlackwaterStinson. Precepted with Dr. Pollie MeyerMcIntyre today. We developed the following plan: - Frequent BP checks - [facility] blood [facility] or here in clinic in 2 days - Next prenatal appointment to be scheduled for 03/24/16 - If BP is >140/90, should obtain new PIH labs at that time (cannot just count elevated UP:C from 11/22 as it went down on 11/29) - If BP consistently over 140/90 on multiple checks several hours apart, this is an indication for induction of labor (now inducing gHTN at 37 weeks). - NST and BPP 12/8.  GBS and gc/chlamydia  testing results were reviewed today.   Labor and fetal movement precautions reviewed. PIH precautions also reviewed at length with patient  telephone number updated in demographics     Erasmo DownerAngela M Kieara Schwark, MD, MPH PGY-3,  [facility] 03/19/2016 9:35 AM

## 2016-03-19 NOTE — Patient Instructions (Signed)
Hipertensin durante el embarazo (Hypertension During Pregnancy) La hipertensin tambin se denomina presin arterial alta. La presin arterial hace que se mueva la sangre en el cuerpo. A veces, la fuerza que mueve la sangre es demasiado intensa. Cuando est embarazada, esta afeccin se debe controlar atentamente. Puede causar problemas para usted y su beb. CUIDADOS EN EL HOGAR  Cumpla con todos los controles mdicos.  Tome los medicamentos como le indic su mdico. Informe a su mdico sobre todos los medicamentos que toma.  Coma muy poca sal.  Haga ejercicios regularmente.  No beba alcohol.  No fume.  No tome bebidas con cafena.  Acustese sobre el lado izquierdo cuando haga reposo.  Su mdico puede recomendarle que tome una aspirina de dosis baja (81 mg) cada da.  SOLICITE AYUDA DE INMEDIATO SI:  Siente un dolor intenso en el vientre (abdominal).  Nota una hinchazn repentina en las manos, los tobillos o la cara.  Aument 4libras (1,8kg) o ms en 1semana.  Vomita) repetidas veces.  Tiene una hemorragia por la vagina.  No siente los movimientos del beb.  Tiene cefalea.  Tiene visin doble o borrosa.  Tiene calambres o espasmos musculares.  Le falta el aire.  Tiene las yemas de los dedos y los labios azules.  Observa sangre en la orina.  ASEGRESE DE QUE:  Comprende estas instrucciones.  Controlar su afeccin.  Recibir ayuda de inmediato si no mejora o si empeora.  Esta informacin no tiene como fin reemplazar el consejo del mdico. Asegrese de hacerle al mdico cualquier pregunta que tenga. Document Released: 07/16/2010 Document Revised: 04/21/2014 Document Reviewed: 09/14/2015 Elsevier Interactive Patient Education  2017 Elsevier Inc.  

## 2016-03-21 ENCOUNTER — Ambulatory Visit (HOSPITAL_COMMUNITY)
Admission: RE | Admit: 2016-03-21 | Discharge: 2016-03-21 | Disposition: A | Discharge status: Home / Self Care | Payer: Self-pay | Source: Ambulatory Visit | Attending: Family Medicine | Admitting: Family Medicine

## 2016-03-21 ENCOUNTER — Telehealth: Payer: Self-pay | Admitting: Family Medicine

## 2016-03-21 ENCOUNTER — Encounter (HOSPITAL_COMMUNITY): Payer: Self-pay

## 2016-03-21 ENCOUNTER — Ambulatory Visit (HOSPITAL_COMMUNITY): Admission: RE | Admit: 2016-03-21 | Payer: Self-pay | Source: Ambulatory Visit

## 2016-03-21 ENCOUNTER — Inpatient Hospital Stay (HOSPITAL_COMMUNITY)
Admission: AD | Admit: 2016-03-21 | Discharge: 2016-03-21 | Disposition: A | Discharge status: Home / Self Care | Payer: Self-pay | Source: Ambulatory Visit | Attending: Obstetrics & Gynecology | Admitting: Obstetrics & Gynecology

## 2016-03-21 ENCOUNTER — Other Ambulatory Visit: Payer: Self-pay | Admitting: Family Medicine

## 2016-03-21 ENCOUNTER — Ambulatory Visit (HOSPITAL_COMMUNITY): Payer: Self-pay

## 2016-03-21 DIAGNOSIS — Z3A38 38 weeks gestation of pregnancy: Secondary | ICD-10-CM | POA: Insufficient documentation

## 2016-03-21 DIAGNOSIS — O1213 Gestational proteinuria, third trimester: Secondary | ICD-10-CM

## 2016-03-21 DIAGNOSIS — R030 Elevated blood-pressure reading, without diagnosis of hypertension: Secondary | ICD-10-CM

## 2016-03-21 DIAGNOSIS — O34211 Maternal care for low transverse scar from previous cesarean delivery: Secondary | ICD-10-CM | POA: Insufficient documentation

## 2016-03-21 DIAGNOSIS — O0932 Supervision of pregnancy with insufficient antenatal care, second trimester: Secondary | ICD-10-CM

## 2016-03-21 DIAGNOSIS — O0933 Supervision of pregnancy with insufficient antenatal care, third trimester: Secondary | ICD-10-CM | POA: Insufficient documentation

## 2016-03-21 DIAGNOSIS — O163 Unspecified maternal hypertension, third trimester: Secondary | ICD-10-CM

## 2016-03-21 DIAGNOSIS — O10013 Pre-existing essential hypertension complicating pregnancy, third trimester: Secondary | ICD-10-CM | POA: Insufficient documentation

## 2016-03-21 LAB — URINALYSIS, ROUTINE W REFLEX MICROSCOPIC
Bilirubin Urine: NEGATIVE
Glucose, UA: NEGATIVE mg/dL
KETONES UR: NEGATIVE mg/dL
NITRITE: NEGATIVE
PH: 7.5 (ref 5.0–8.0)
Protein, ur: NEGATIVE mg/dL
Specific Gravity, Urine: 1.015 (ref 1.005–1.030)

## 2016-03-21 LAB — CBC
HCT: 34.9 % — ABNORMAL LOW (ref 36.0–46.0)
HEMOGLOBIN: 11.8 g/dL — AB (ref 12.0–15.0)
MCH: 29.3 pg (ref 26.0–34.0)
MCHC: 33.8 g/dL (ref 30.0–36.0)
MCV: 86.6 fL (ref 78.0–100.0)
Platelets: 277 10*3/uL (ref 150–400)
RBC: 4.03 MIL/uL (ref 3.87–5.11)
RDW: 15.1 % (ref 11.5–15.5)
WBC: 10.2 10*3/uL (ref 4.0–10.5)

## 2016-03-21 LAB — PROTEIN / CREATININE RATIO, URINE
Creatinine, Urine: 94 mg/dL
Protein Creatinine Ratio: 0.39 mg/mg{Cre} — ABNORMAL HIGH (ref 0.00–0.15)
Total Protein, Urine: 37 mg/dL

## 2016-03-21 LAB — COMPREHENSIVE METABOLIC PANEL
ALBUMIN: 3.1 g/dL — AB (ref 3.5–5.0)
ALK PHOS: 148 U/L — AB (ref 38–126)
ALT: 38 U/L (ref 14–54)
AST: 37 U/L (ref 15–41)
Anion gap: 8 (ref 5–15)
BUN: 9 mg/dL (ref 6–20)
CALCIUM: 9 mg/dL (ref 8.9–10.3)
CHLORIDE: 105 mmol/L (ref 101–111)
CO2: 22 mmol/L (ref 22–32)
CREATININE: 0.45 mg/dL (ref 0.44–1.00)
GFR calc non Af Amer: >60 mL/min (ref >60)
GLUCOSE: 83 mg/dL (ref 65–99)
Potassium: 3.8 mmol/L (ref 3.5–5.1)
SODIUM: 135 mmol/L (ref 135–145)
Total Bilirubin: 0.3 mg/dL (ref 0.3–1.2)
Total Protein: 6.8 g/dL (ref 6.5–8.1)

## 2016-03-21 LAB — URINALYSIS, MICROSCOPIC (REFLEX)

## 2016-03-21 NOTE — MAU Provider Note (Signed)
Chief Complaint:  Hypertension   First Provider Initiated Contact with Patient 03/21/16 1937     HPI: Tara [name] is a 35 y.o. [id number] at 3338w0dwho presents to maternity admissions reporting Elevated BP in office today.  Denies symptoms. Followed in Family Medicine office. She reports good fetal movement, denies LOF, vaginal bleeding, vaginal itching/burning, urinary symptoms, h/a, dizziness, n/v, diarrhea, constipation or fever/chills.  She denies headache, visual changes or RUQ abdominal pain.  Hx Prev C/S for elevated BP  Hypertension  This is a new problem. The current episode started today. The problem has been gradually improving since onset. Pertinent negatives include no anxiety, blurred vision, chest pain, headaches, neck pain, peripheral edema or shortness of breath. There are no associated agents to hypertension. Past treatments include nothing. There are no compliance problems.     Past Medical History: Past Medical History:  Diagnosis Date  . Hypertension     Past obstetric history: OB History  Gravida Para Term Preterm AB Living  4 2 2  0 1 2  SAB TAB Ectopic Multiple Live Births  0 1 0 0 2    # Outcome Date GA Lbr Len/2nd Weight Sex Delivery Anes PTL Lv  4 Current           3 Term 01/31/15 5938w4d 12:10 / 00:45 8 lb 1.6 oz (3.675 kg) M VBAC EPI  LIV     Birth Comments: IOL at 39w for chronic hypertension  2 TAB 2015 10355w0d            Birth Comments: medical TAB  1 Term 2005 5917w0d  8 lb (3.629 kg) F CS-Unspec   LIV     Birth Comments: [place] in [place] for "elevated BP" at around 37w, did not have trial of labor, went straight for cesarean, baby around 8lb      Past Surgical History: Past Surgical History:  Procedure Laterality Date  . CESAREAN SECTION      Family History: Family History  Problem Relation Age of Onset  . Diabetes Mother   . Hypertension Mother     Social History: Social History  Substance Use Topics  . Smoking status: Never  Smoker  . Smokeless tobacco: Never Used  . Alcohol use No    Allergies: No Known Allergies  Meds:  Prescriptions Prior to Admission  Medication Sig Dispense Refill Last Dose  . acetaminophen (TYLENOL) 325 MG tablet Take 650 mg by mouth every 6 (six) hours as needed for moderate pain or headache.   Taking  . Prenatal Vit-Fe Fumarate-FA (PRENATAL MULTIVITAMIN) TABS tablet Take 1 tablet by mouth daily at 12 noon.   Taking    I have reviewed patient's Past Medical Hx, Surgical Hx, Family Hx, Social Hx, medications and allergies.   ROS:  Review of Systems  Eyes: Negative for blurred vision.  Respiratory: Negative for shortness of breath.   Cardiovascular: Negative for chest pain.  Musculoskeletal: Negative for neck pain.  Neurological: Negative for headaches.   Other systems negative  Physical Exam   Patient Vitals for the past 24 hrs:  BP Temp Temp src Pulse Resp Height Weight  03/21/16 2114 - 97.9 F (36.6 C) Oral - 18 - -  03/21/16 2105 131/77 - - 79 - - -  03/21/16 2035 131/75 - - 87 - - -  03/21/16 2004 130/81 - - 79 - - -  03/21/16 1934 124/80 - - 84 - - -  03/21/16 1929 123/80 - - 83 - - -  03/21/16  1924 127/78 - - 86 - - -  03/21/16 1919 118/69 - - 86 - - -  03/21/16 1914 123/71 - - 83 - - -  03/21/16 1911 118/69 - - 84 - - -  03/21/16 1909 120/72 97.9 F (36.6 C) Oral 80 20 - -  03/21/16 1854 145/94 98.5 F (36.9 C) - 74 16 5\' 5"  (1.651 m) 202 lb (91.6 kg)   Constitutional: Well-developed, well-nourished female in no acute distress.  Cardiovascular: normal rate and rhythm Respiratory: normal effort, clear to auscultation bilaterally GI: Abd soft, non-tender, gravid appropriate for gestational age.   No rebound or guarding. MS: Extremities nontender, no edema, normal ROM Neurologic: Alert and oriented x 4. DTRs 1+/no clonus GU: Neg CVAT.  PELVIC EXAM:  deferred  FHT:  Baseline 135 , moderate variability, accelerations present, no decelerations Contractions:  Rare   Labs: Results for orders placed or performed during the hospital encounter of 03/21/16 (from the past 24 hour(s))  Protein / creatinine ratio, urine     Status: Abnormal   Collection Time: 03/21/16  6:56 PM  Result Value Ref Range   Creatinine, Urine 94.00 mg/dL   Total Protein, Urine 37 mg/dL   Protein Creatinine Ratio 0.39 (H) 0.00 - 0.15 mg/mg[Cre]  Urinalysis, Routine w reflex microscopic     Status: Abnormal   Collection Time: 03/21/16  6:59 PM  Result Value Ref Range   Color, Urine YELLOW YELLOW   APPearance CLEAR CLEAR   Specific Gravity, Urine 1.015 1.005 - 1.030   pH 7.5 5.0 - 8.0   Glucose, UA NEGATIVE NEGATIVE mg/dL   Hgb urine dipstick TRACE (A) NEGATIVE   Bilirubin Urine NEGATIVE NEGATIVE   Ketones, ur NEGATIVE NEGATIVE mg/dL   Protein, ur NEGATIVE NEGATIVE mg/dL   Nitrite NEGATIVE NEGATIVE   Leukocytes, UA SMALL (A) NEGATIVE  Urinalysis, Microscopic (reflex)     Status: Abnormal   Collection Time: 03/21/16  6:59 PM  Result Value Ref Range   RBC / HPF 0-5 0 - 5 RBC/hpf   WBC, UA 0-5 0 - 5 WBC/hpf   Bacteria, UA FEW (A) NONE SEEN   Squamous Epithelial / LPF 0-5 (A) NONE SEEN  CBC     Status: Abnormal   Collection Time: 03/21/16  8:04 PM  Result Value Ref Range   WBC 10.2 4.0 - 10.5 K/uL   RBC 4.03 3.87 - 5.11 MIL/uL   Hemoglobin 11.8 (L) 12.0 - 15.0 g/dL   HCT 40.934.9 (L) 81.136.0 - 91.446.0 %   MCV 86.6 78.0 - 100.0 fL   MCH 29.3 26.0 - 34.0 pg   MCHC 33.8 30.0 - 36.0 g/dL   RDW 78.215.1 95.611.5 - 21.315.5 %   Platelets 277 150 - 400 K/uL  Comprehensive metabolic panel     Status: Abnormal   Collection Time: 03/21/16  8:04 PM  Result Value Ref Range   Sodium 135 135 - 145 mmol/L   Potassium 3.8 3.5 - 5.1 mmol/L   Chloride 105 101 - 111 mmol/L   CO2 22 22 - 32 mmol/L   Glucose, Bld 83 65 - 99 mg/dL   BUN 9 6 - 20 mg/dL   Creatinine, Ser 0.860.45 0.44 - 1.00 mg/dL   Calcium 9.0 8.9 - 57.810.3 mg/dL   Total Protein 6.8 6.5 - 8.1 g/dL   Albumin 3.1 (L) 3.5 - 5.0 g/dL   AST  37 15 - 41 U/L   ALT 38 14 - 54 U/L   Alkaline Phosphatase 148 (H)  38 - 126 U/L   Total Bilirubin 0.3 0.3 - 1.2 mg/dL   GFR calc non Af Amer >60 >60 mL/min   GFR calc Af Amer >60 >60 mL/min   Anion gap 8 5 - 15   O/POS/-- (09/20 1533)  Imaging:  US Fetal Bpp Wo Non Stress  Result Date: 03/21/2016 OBSTETRICAL ULTRASOUND: This exam was performed within a [facility] Ultrasound Department. The OB US report was generated in the AS system, and faxed to the ordering physician.  This report is available in the [employer]. See the AS Obstetric US report via the Image Link.   MAU Course/MDM: I have ordered labs and reviewed results.  NST reviewed   Assessment: SIUP at [redacted]w[redacted]d Labile hypertension  Plan: Care turned over to oncoming provider  Wynelle Bourgeois CNM, MSN Certified Nurse-Midwife 03/21/2016 7:38 PM    Labs & BPs reviewed with Dr. Despina Hidden. Recommends f/u BP on Monday.  Elevated urine PCR is consistent with previous labs & pt remained normotensive after initial BP.  Per patient; has f/u appt at [facility] on Monday. Msg sent to Dr. Pollie Meyer to confirm this. Stressed importance of keeping this appt or return for s/s pre eclampsia.  Spanish interpreter at bedside.   Judeth Horn, NP

## 2016-03-21 NOTE — Telephone Encounter (Signed)
Received message from MAU provider Judeth HornErin Lawrence NP that [facility] BP recheck, but that it wasn't scheduled.   Evidently patient left her visit the other day without scheduling a follow up appointment.   Dr. Shawnie PonsPratt had an opening, so I went ahead and scheduled patient with [facility] on Monday at 2:30p. Red team, please call patient first thing Monday morning to let her know about this appointment.   Can decide on timing/indication for induction at visit Monday - whether at 39w for "chronic hypertension" (though this dx is in question) vs immediately if BP recurrently elevated, vs expectantly managing.   Appreciate all the help OB faculty have given us with this patient.  Latrelle DodrillBrittany J Jya Hughston, MD

## 2016-03-21 NOTE — Telephone Encounter (Signed)
Reviewed BPP results from today - 8/8. NST was canceled as was deemed by MFM to not need NST today.  I do NOT think patient has true chronic hypertension - her blood pressures have been perfect thus far in her pregnancy, off all blood pressure medications, up until the last few weeks. Per speaking with Dr. Beryle FlockBacigalupo, patient denied at initial OB visit that she had been followed by a provider outside of pregnancy for her blood pressure or had taken medications outside of pregnancy for blood pressure.  Review of notes from previous pregnancy with Dr. Gaynell FaceMarshall shows that blood pressure crept up at the end of pregnancy and she was eventually placed on labetalol at 37w, and then induced at 39w for "chronic hypertension" - though still with normal blood pressures earlier in pregnancy while off medications. Spoke with Dr. Emelda FearFerguson on the phone about this patient on 12/6, and he agreed chronic hypertension dx is in question here and that patient has been appropriate to [facility].   Notably, BP was checked today while at ultrasound appointment and was 138/93. I don't see that [facility] pressure. If blood pressure remains >140 systolic or >90 systolic, this would be an indication to induce labor for gestational hypertension. If persistently up like this, will also need new set of PIH labs, especially given that she had elevated protein:creatinine ratio on 11/22.  Spoke on the phone shortly ago with Dr. Shawnie PonsPratt, who concurs with need for blood pressure to be rechecked today. As [facility] is now closed due to weather, patient will need to go to MAU.  I called patient using phone interpreter and explained need for her to go to MAU for blood pressure evaluation. She understands and will find a way to get there tonight. She denies any headache, vision changes, abdominal pain, or swelling. Patient appreciative.  FYI to Dr.  Beryle FlockBacigalupo.  Latrelle DodrillBrittany J Juandavid Dallman, MD

## 2016-03-21 NOTE — ED Notes (Signed)
Pt in MFM today with NST and BPP scheduled.  Dr. Claudean SeveranceWhitecar reviewed pt chart, NST and BPP not indicated.  Pt to have BPP only.  NST canceled.

## 2016-03-21 NOTE — Progress Notes (Addendum)
Assumed care of pt. Pt in triage for HTN. Serial BP initiated. Please see flow sheet for details. Spanish speaking and Interpreter notified. Pt states has chronic hTN and was on medication in the past. Hx of previous c/s for HTN with successful VBAC. Current pregnancy unremarkable with exception to HTN.   2014: FP MD notified via phone. Report status of pt given. Aware of Bp.   2018: NP in house assuming care of pt. FP resident made aware to disregard request to see pt. [facility].    2110: MD and interpreter at bs. Discussed discharge instructions with pt followup with OB appt. Pt verbalizes understanding.

## 2016-03-21 NOTE — Discharge Instructions (Signed)
Hipertensin durante el embarazo (Hypertension During Pregnancy) Cuando se sufre hipertensin arterial o presin arterial alta, existe una presin extra en el interior de los vasos sanguneos que llevan la sangre desde el corazn al resto del cuerpo (arterias). Esto puede suceder en cualquier etapa de la vida, incluido el embarazo. La hipertensin durante el embarazo puede causar problemas para usted y el beb. Es posible que el beb no tenga el peso adecuado al nacer o puede que nazca antes de tiempo (prematuro). En los casos muy graves de hipertensin durante el embarazo puede estar en peligro la vida.  Durante el embarazo se pueden presentar diferentes tipos de hipertensin arterial. Estos incluyen:  Hipertensin crnica. Esto sucede cuando una mujer sufre de hipertensin arterial antes del Media planner y Sports coach.  Hipertensin gestacional. Es cuando se desarrolla la hipertensin Solicitor.  Preeclampsia o toxemia del embarazo. Es un tipo muy grave de hipertensin que se desarrolla solo durante el [place]. Afecta a todo el cuerpo y puede ser muy peligrosa para la madre y el beb. La hipertensin gestacional y preeclampsia por lo general, desaparecen despus del nacimiento del beb. La presin arterial probablemente se estabilizar en un perodo de 6 semanas. Las mujeres que sufren de hipertensin durante el embarazo tienen una mayor probabilidad de Actor hipertensin en etapas posteriores de la vida o en embarazos futuros. FACTORES DE RIESGO Existen ciertos factores que aumentan las probabilidades de que desarrolle hipertensin durante el [place]. Estos incluyen:  Tener hipertensin arterial antes del embarazo.  Haber sufrido hipertensin arterial durante un embarazo anterior.  Tener sobrepeso.  Ser mayor de 40 aos.  Estar embarazada de ms de un beb.  Tener diabetes o problemas renales. [place] hipertensin arterial gestacional y crnica en  raras ocasiones provoca sntomas. La preeclampsia causa sntomas, que pueden ser:  Aumento de las protenas en la orina. El Management consultant en cada visita prenatal.  [place] y la cara.  Aumento rpido de [place].  Dolores de [place].  Cambios en la visin.  Molestias al ver la luz.  Dolor abdominal, especialmente en el rea superior derecha.  Dolor en el pecho.  Falta de aire.  Aumento de los reflejos.  Convulsiones. Las convulsiones ocurren en una forma ms grave de preeclampsia, llamada eclampsia. DIAGNSTICO  Es posible que se le diagnostique hipertensin arterial durante un control prenatal regular. En cada visita prenatal, es posible que le realicen los siguientes exmenes:  Control de la presin arterial.  Anlisis de orina para detectar protenas en la orina. El tipo de hipertensin que se diagnostica depende del momento en que se desarroll. Tambin depende de la lectura de su presin arterial especfica.  El desarrollo de hipertensin arterial antes de lasemana 20 de embarazo es consecuente con la hipertensin arterial crnica.  El desarrollo de hipertensin arterial despus de la semana 20 de embarazo es consecuente con la hipertensin gestacional.  Hipertensin con aumento de la protena urinaria se diagnostica como preeclampsia.  Las mediciones de la presin arterial de ms de 0000000 sistlica o [id number] diastlica son un signo de preeclampsia grave. TRATAMIENTO El tratamiento para la hipertensin durante el embarazo vara. Depende del tipo de hipertensin y de su gravedad.  Si toma medicamentos para la hipertensin crnica, puede que tenga que cambiarlos.  Los medicamentos llamados inhibidores de la ECA no deben tomarse Solicitor.  Para las mujeres que tienen factores de riesgo de preeclampsia pueden recomendarse bajas dosis de aspirina.  Si usted tiene  hipertensin gestacional, tendr que tomar un medicamento para la presin arterial que  sea seguro durante el embarazo. El mdico le recomendar el medicamento apropiado.  Si tiene preeclampsia grave, es posible que tengMarket researchera que Engineer, maintenancepermanecer en el hospital. Los mdicos la controlarn a usted y al beb muy de cerca. Probablemente deba tomar un medicamento denominado sulfato de magnesio para prevenir las convulsiones y reducir la presin arterial.  A veces es necesario inducir un parto prematuro. Por ejemplo, si la afeccin empeora. Se hace para protegerlos a usted y a su beb. La nica cura para la preeclampsia es el parto.  Su mdico puede recomendarle que tome una aspirina de dosis baja (81mg ) cada da, a fin de ayudar a prevenir la hipertensin durante el embarazo, si est en riesgo de padecer preeclampsia. Puede estar en riesgo de padecer preeclampsia si:  Padeci preeclampsia o eclampsia durante un embarazo anterior.  Su beb no creci segn lo previsto durante un embarazo anterior.  Tuvo un parto prematuro en un embarazo anterior.  Experiment una separacin de la placenta desde el tero (desprendimiento abrupto de la placenta) durante un embarazo anterior.  Perdi un beb en un embarazo anterior.  Est embarazada de ms de un beb.  Padece otras afecciones mdicas, como diabetes o una enfermedad autoinmunitaria. INSTRUCCIONES PARA EL CUIDADO EN EL HOGAR  Programe y concurra a todas las citas de control prenatal regulares. Esto es importante.  Tome los medicamentos solamente como se lo haya indicado el mdico. Dgale a su mdico todos los medicamentos que toma.  Consuma la menor cantidad posible de sal.  Realice actividad fsica con regularidad.  No beba alcohol.  No fume ni use productos que contengan tabaco.  No beba productos con cafena.  Acustese sobre el lado izquierdo cuando haga reposo. SOLICITE ATENCIN MDICA DE INMEDIATO SI:  Siente un dolor abdominal intenso.  Presenta hinchazn repentina en las manos, los tobillos o el rostro.  Aumenta ms de 4  libras (1,8 kg) en una semana.  Vomita repetidas veces.  Tiene una hemorragia vaginal abundante.  No siente que el beb se mueva mucho.  Tiene dolores de [place].  Tiene visin doble o borrosa.  Tiene calambres o espasmos musculares.  Le falta el aire.  Tiene los labios y las uas de los dedos de las manos de Edison Internationalcolor azul.  Observa sangre en la orina. ASEGRESE DE QUE:  Comprende estas instrucciones.  Controlar su afeccin.  Recibir ayuda de inmediato si no mejora o si empeora. Esta informacin no tiene Theme park managercomo fin reemplazar el consejo del mdico. Asegrese de hacerle al mdico cualquier pregunta que tenga. Document Released: 03/20/2011 Document Revised: 04/21/2014 Elsevier Interactive Patient Education  2017 [employer]

## 2016-03-21 NOTE — MAU Note (Addendum)
Discharge instructions given with interpreter at bs. Pt verbalizes understanding. Pt discharged home with family and friends. Awaiting CAB ride and CAB notified.   2124: pt left unit via ambulatory with family and friends. Waiting in waiting area for cab ride home.

## 2016-03-21 NOTE — MAU Note (Signed)
Patient was send by Dr. Ardeth SportsmanMcIntire at [facility], BP up received phone call today to come to MAU for recheck, patient denies headache, no blurred vision, no epigastric pain.

## 2016-03-24 ENCOUNTER — Ambulatory Visit (INDEPENDENT_AMBULATORY_CARE_PROVIDER_SITE_OTHER): Payer: Self-pay | Admitting: Family Medicine

## 2016-03-24 VITALS — BP 126/72 | HR 67 | Temp 98.1°F | Wt 203.0 lb

## 2016-03-24 DIAGNOSIS — O133 Gestational [pregnancy-induced] hypertension without significant proteinuria, third trimester: Secondary | ICD-10-CM

## 2016-03-24 DIAGNOSIS — Z3483 Encounter for supervision of other normal pregnancy, third trimester: Secondary | ICD-10-CM

## 2016-03-24 DIAGNOSIS — O34219 Maternal care for unspecified type scar from previous cesarean delivery: Secondary | ICD-10-CM

## 2016-03-24 DIAGNOSIS — O163 Unspecified maternal hypertension, third trimester: Secondary | ICD-10-CM

## 2016-03-24 NOTE — Progress Notes (Signed)
   PRENATAL VISIT NOTE  Subjective:  Tara [name] is a 35 y.o. [id number] at 5521w3d being seen today for ongoing prenatal care.  She is currently monitored for the following issues for this high-risk pregnancy and has Encounter for supervision of normal pregnancy; Previous cesarean delivery, antepartum condition or complication; GERD (gastroesophageal reflux disease); and Elevated blood pressure affecting pregnancy in third trimester, antepartum on her problem list.  Patient reports no complaints.  Contractions: Not present.  .  Movement: Present. Denies leaking of fluid.   The following portions of the patient's history were reviewed and updated as appropriate: allergies, current medications, past family history, past medical history, past social history, past surgical history and problem list. Problem list updated.  Objective:   Vitals:   03/24/16 1415  BP: 126/72  Pulse: 67  Temp: 98.1 F (36.7 C)  Weight: 203 lb (92.1 kg)    Fetal Status: Fetal Heart Rate (bpm): 135 Fundal Height: 38 cm Movement: Present  Presentation: Vertex  General:  Alert, oriented and cooperative. Patient is in no acute distress.  Skin: Skin is warm and dry. No rash noted.   Cardiovascular: Normal heart rate noted  Respiratory: Normal respiratory effort, no problems with respiration noted  Abdomen: Soft, gravid, appropriate for gestational age. Pain/Pressure: Absent     Pelvic:  Cervical exam deferred        Extremities: Normal range of motion.  Edema: None  Mental Status: Normal mood and affect. Normal behavior. Normal judgment and thought content.   Assessment and Plan:  Pregnancy: [id number] at 3321w3d  1. Encounter for supervision of other normal pregnancy in third trimester Continue prenatal care.   2. Elevated blood pressure affecting pregnancy in third trimester, antepartum None have been truly elevated, just borderline. Had some GHTN last pregnancy, Labs are WNL, however, her urine has  consistent proteinuria--will schedule IOL at 39 wks.  3. Previous cesarean delivery, antepartum condition or complication For TOLAC--consent signed--for BTL if goes to C-section.  Term labor symptoms and general obstetric precautions including but not limited to vaginal bleeding, contractions, leaking of fluid and fetal movement were reviewed in detail with the patient. Please refer to After Visit Summary for other counseling recommendations.  Return in 6 weeks (on 05/05/2016) for pp check.   Reva Boresanya S Vickee Mormino, MD

## 2016-03-24 NOTE — Telephone Encounter (Signed)
Called patient twice this morning before 10am and left voice messages with the help of spanish interpreters (pacific interpreters). Left message stating that an appointment has been scheduled for her for today with 2:30 and that it was very important that he keep it and to give us a call if she could not make it.

## 2016-03-24 NOTE — Patient Instructions (Addendum)
Tercer trimestre de embarazo (Third Trimester of Pregnancy) El tercer trimestre comprende desde la semana29 hasta la semana42, es decir, desde el mes7 hasta el mes9. El tercer trimestre es un perodo en el que el feto crece rpidamente. Hacia el final del noveno mes, el feto mide alrededor de 20pulgadas (45cm) de largo y pesa entre 6 y 10 libras (2,700 y 4,500kg). CAMBIOS EN EL ORGANISMO Su organismo atraviesa por muchos cambios durante el embarazo, y estos varan de una mujer a otra.  Seguir aumentando de peso. Es de esperar que aumente entre 25 y 35libras (11 y 16kg) hacia el final del embarazo.  Podrn aparecer las primeras estras en las caderas, el abdomen y las mamas.  Puede tener necesidad de orinar con ms frecuencia porque el feto baja hacia la pelvis y ejerce presin sobre la vejiga.  Debido al embarazo podr sentir acidez estomacal con frecuencia.  Puede estar estreida, ya que ciertas hormonas enlentecen los movimientos de los msculos que empujan los desechos a travs de los intestinos.  Pueden aparecer hemorroides o abultarse e hincharse las venas (venas varicosas).  Puede sentir dolor plvico debido al aumento de peso y a que las hormonas del embarazo relajan las articulaciones entre los huesos de la pelvis. El dolor de espalda puede ser consecuencia de la sobrecarga de los msculos que soportan la postura.  Tal vez haya cambios en el cabello que pueden incluir su engrosamiento, crecimiento rpido y cambios en la textura. Adems, a algunas mujeres se les cae el cabello durante o despus del embarazo, o tienen el cabello seco o fino. Lo ms probable es que el cabello se le normalice despus del nacimiento del beb.  Las mamas seguirn creciendo y le dolern. A veces, puede haber una secrecin amarilla de las mamas llamada calostro.  El ombligo puede salir hacia afuera.  Puede sentir que le falta el aire debido a que se expande el tero.  Puede notar que el feto  "baja" o lo siente ms bajo, en el abdomen.  Puede tener una prdida de secrecin mucosa con sangre. Esto suele ocurrir en el trmino de unos pocos das a una semana antes de que comience el trabajo de parto.  El cuello del tero se vuelve delgado y blando (se borra) cerca de la fecha de parto. QU DEBE ESPERAR EN LOS EXMENES PRENATALES Le harn exmenes prenatales cada 2semanas hasta la semana36. A partir de ese momento le harn exmenes semanales. Durante una visita prenatal de rutina:  La pesarn para asegurarse de que usted y el feto estn creciendo normalmente.  Le tomarn la presin arterial.  Le medirn el abdomen para controlar el desarrollo del beb.  Se escucharn los latidos cardacos fetales.  Se evaluarn los resultados de los estudios solicitados en visitas anteriores.  Le revisarn el cuello del tero cuando est prxima la fecha de parto para controlar si este se ha borrado. Alrededor de la semana36, el mdico le revisar el cuello del tero. Al mismo tiempo, realizar un anlisis de las secreciones del tejido vaginal. Este examen es para determinar si hay un tipo de bacteria, estreptococo Grupo B. El mdico le explicar esto con ms detalle. El mdico puede preguntarle lo siguiente:  Cmo le gustara que fuera el parto.  Cmo se siente.  Si siente los movimientos del beb.  Si ha tenido sntomas anormales, como prdida de lquido, sangrado, dolores de cabeza intensos o clicos abdominales.  Si est consumiendo algn producto que contenga tabaco, como cigarrillos, tabaco de mascar y   cigarrillos electrnicos.  Si tiene alguna pregunta. Otros exmenes o estudios de deteccin que pueden realizarse durante el tercer trimestre incluyen lo siguiente:  Anlisis de sangre para controlar los niveles de hierro (anemia).  Controles fetales para determinar su salud, nivel de actividad y crecimiento. Si tiene alguna enfermedad o hay problemas durante el embarazo, le harn  estudios.  Prueba del VIH (virus de inmunodeficiencia humana). Si corre un riesgo alto, pueden realizarle una prueba de deteccin del VIH durante el tercer trimestre del embarazo. FALSO TRABAJO DE PARTO Es posible que sienta contracciones leves e irregulares que finalmente desaparecen. Se llaman contracciones de Braxton Hicks o falso trabajo de parto. Las contracciones pueden durar horas, das o incluso semanas, antes de que el verdadero trabajo de parto se inicie. Si las contracciones ocurren a intervalos regulares, se intensifican o se hacen dolorosas, lo mejor es que la revise el mdico. SIGNOS DE TRABAJO DE PARTO  Clicos de tipo menstrual.  Contracciones cada 5minutos o menos.  Contracciones que comienzan en la parte superior del tero y se extienden hacia abajo, a la zona inferior del abdomen y la espalda.  Sensacin de mayor presin en la pelvis o dolor de espalda.  Una secrecin de mucosidad acuosa o con sangre que sale de la vagina. Si tiene alguno de estos signos antes de la semana37 del embarazo, llame a su mdico de inmediato. Debe concurrir al hospital para que la controlen inmediatamente. INSTRUCCIONES PARA EL CUIDADO EN EL HOGAR  Evite fumar, consumir hierbas, beber alcohol y tomar frmacos que no le hayan recetado. Estas sustancias qumicas afectan la formacin y el desarrollo del beb.  No consuma ningn producto que contenga tabaco, lo que incluye cigarrillos, tabaco de mascar y cigarrillos electrnicos. Si necesita ayuda para dejar de fumar, consulte al mdico. Puede recibir asesoramiento y otro tipo de recursos para dejar de fumar.  Siga las indicaciones del mdico en relacin con el uso de medicamentos. Durante el embarazo, hay medicamentos que son seguros de tomar y otros que no.  Haga ejercicio solamente como se lo haya indicado el mdico. Sentir clicos uterinos es un buen signo para detener la actividad fsica.  Contine comiendo alimentos sanos con  regularidad.  Use un sostn que le brinde buen soporte si le duelen las mamas.  No se d baos de inmersin en agua caliente, baos turcos ni saunas.  Use el cinturn de seguridad en todo momento mientras conduce.  No coma carne cruda ni queso sin cocinar; evite el contacto con las bandejas sanitarias de los gatos y la tierra que estos animales usan. Estos elementos contienen grmenes que pueden causar defectos congnitos en el beb.  Tome las vitaminas prenatales.  Tome entre 1500 y 2000mg de calcio diariamente comenzando en la semana20 del embarazo hasta el parto.  Si est estreida, pruebe un laxante suave (si el mdico lo autoriza). Consuma ms alimentos ricos en fibra, como vegetales y frutas frescos y cereales integrales. Beba gran cantidad de lquido para mantener la orina de tono claro o color amarillo plido.  Dese baos de asiento con agua tibia para aliviar el dolor o las molestias causadas por las hemorroides. Use una crema para las hemorroides si el mdico la autoriza.  Si tiene venas varicosas, use medias de descanso. Eleve los pies durante 15minutos, 3 o 4veces por da. Limite el consumo de sal en su dieta.  Evite levantar objetos pesados, use zapatos de tacones bajos y mantenga una buena postura.  Descanse con las piernas elevadas si tiene   calambres o dolor de cintura.  Visite a su dentista si no lo ha hecho durante el embarazo. Use un cepillo de dientes blando para higienizarse los dientes y psese el hilo dental con suavidad.  Puede seguir manteniendo relaciones sexuales, a menos que el mdico le indique lo contrario.  No haga viajes largos excepto que sea absolutamente necesario y solo con la autorizacin del mdico.  Tome clases prenatales para entender, practicar y hacer preguntas sobre el trabajo de parto y el parto.  Haga un ensayo de la partida al hospital.  Prepare el bolso que llevar al hospital.  Prepare la habitacin del beb.  Concurra a todas  las visitas prenatales segn las indicaciones de su mdico.  SOLICITE ATENCIN MDICA SI:  No est segura de que est en trabajo de parto o de que ha roto la bolsa de las aguas.  Tiene mareos.  Siente clicos leves, presin en la pelvis o dolor persistente en el abdomen.  Tiene nuseas, vmitos o diarrea persistentes.  Observa una secrecin vaginal con mal olor.  Siente dolor al orinar.  SOLICITE ATENCIN MDICA DE INMEDIATO SI:  Tiene fiebre.  Tiene una prdida de lquido por la vagina.  Tiene sangrado o pequeas prdidas vaginales.  Siente dolor intenso o clicos en el abdomen.  Sube o baja de peso rpidamente.  Tiene dificultad para respirar y siente dolor de pecho.  Sbitamente se le hinchan mucho el rostro, las manos, los tobillos, los pies o las piernas.  No ha sentido los movimientos del beb durante una hora.  Siente un dolor de cabeza intenso que no se alivia con medicamentos.  Su visin se modifica.  Esta informacin no tiene como fin reemplazar el consejo del mdico. Asegrese de hacerle al mdico cualquier pregunta que tenga. Document Released: 01/08/2005 Document Revised: 04/21/2014 Document Reviewed: 06/01/2012 Elsevier Interactive Patient Education  2017 Elsevier Inc.   Lactancia materna (Breastfeeding) Decidir amamantar es una de las mejores elecciones que puede hacer por usted y su beb. El cambio hormonal durante el embarazo produce el desarrollo del tejido mamario y aumenta la cantidad y el tamao de los conductos galactforos. Estas hormonas tambin permiten que las protenas, los azcares y las grasas de la sangre produzcan la leche materna en las glndulas productoras de leche. Las hormonas impiden que la leche materna sea liberada antes del nacimiento del beb, adems de impulsar el flujo de leche luego del nacimiento. Una vez que ha comenzado a amamantar, pensar en el beb, as como la succin o el llanto, pueden estimular la liberacin de leche  de las glndulas productoras de leche. LOS BENEFICIOS DE AMAMANTAR Para el beb  La primera leche (calostro) ayuda a mejorar el funcionamiento del sistema digestivo del beb.  La leche tiene anticuerpos que ayudan a prevenir las infecciones en el beb.  El beb tiene una menor incidencia de asma, alergias y del sndrome de muerte sbita del lactante.  Los nutrientes en la leche materna son mejores para el beb que la leche maternizada y estn preparados exclusivamente para cubrir las necesidades del beb.  La leche materna mejora el desarrollo cerebral del beb.  Es menos probable que el beb desarrolle otras enfermedades, como obesidad infantil, asma o diabetes mellitus de tipo 2. Para usted  La lactancia materna favorece el desarrollo de un vnculo muy especial entre la madre y el beb.  Es conveniente. La leche materna siempre est disponible a la temperatura correcta y es econmica.  La lactancia materna ayuda a quemar caloras y   a perder el peso ganado durante el embarazo.  Favorece la contraccin del tero al tamao que tena antes del embarazo de manera ms rpida y disminuye el sangrado (loquios) despus del parto.  La lactancia materna contribuye a reducir el riesgo de desarrollar diabetes mellitus de tipo 2, osteoporosis o cncer de mama o de ovario en el futuro. SIGNOS DE QUE EL BEB EST HAMBRIENTO Primeros signos de hambre  Aumenta su estado de alerta o actividad.  Se estira.  Mueve la cabeza de un lado a otro.  Mueve la cabeza y abre la boca cuando se le toca la mejilla o la comisura de la boca (reflejo de bsqueda).  Aumenta las vocalizaciones, tales como sonidos de succin, se relame los labios, emite arrullos, suspiros, o chirridos.  Mueve la mano hacia la boca.  Se chupa con ganas los dedos o las manos. Signos tardos de hambre  Est agitado.  Llora de manera intermitente. Signos de hambre extrema Los signos de hambre extrema requerirn que lo calme y  lo consuele antes de que el beb pueda alimentarse adecuadamente. No espere a que se manifiesten los siguientes signos de hambre extrema para comenzar a amamantar:  Agitacin.  Llanto intenso y fuerte.  Gritos. INFORMACIN BSICA SOBRE LA LACTANCIA MATERNA Iniciacin de la lactancia materna  Encuentre un lugar cmodo para sentarse o acostarse, con un buen respaldo para el cuello y la espalda.  Coloque una almohada o una manta enrollada debajo del beb para acomodarlo a la altura de la mama (si est sentada). Las almohadas para amamantar se han diseado especialmente a fin de servir de apoyo para los brazos y el beb mientras amamanta.  Asegrese de que el abdomen del beb est frente al suyo.  Masajee suavemente la mama. Con las yemas de los dedos, masajee la pared del pecho hacia el pezn en un movimiento circular. Esto estimula el flujo de leche. Es posible que deba continuar este movimiento mientras amamanta si la leche fluye lentamente.  Sostenga la mama con el pulgar por arriba del pezn y los otros 4 dedos por debajo de la mama. Asegrese de que los dedos se encuentren lejos del pezn y de la boca del beb.  Empuje suavemente los labios del beb con el pezn o con el dedo.  Cuando la boca del beb se abra lo suficiente, acrquelo rpidamente a la mama e introduzca todo el pezn y la zona oscura que lo rodea (areola), tanto como sea posible, dentro de la boca del beb. ? Debe haber ms areola visible por arriba del labio superior del beb que por debajo del labio inferior. ? La lengua del beb debe estar entre la enca inferior y la mama.  Asegrese de que la boca del beb est en la posicin correcta alrededor del pezn (prendida). Los labios del beb deben crear un sello sobre la mama y estar doblados hacia afuera (invertidos).  Es comn que el beb succione durante 2 a 3 minutos para que comience el flujo de leche materna. Cmo debe prenderse Es muy importante que le ensee al  beb cmo prenderse adecuadamente a la mama. Si el beb no se prende adecuadamente, puede causarle dolor en el pezn y reducir la produccin de leche materna, y hacer que el beb tenga un escaso aumento de peso. Adems, si el beb no se prende adecuadamente al pezn, puede tragar aire durante la alimentacin. Esto puede causarle molestias al beb. Hacer eructar al beb al cambiar de mama puede ayudarlo a liberar el   aire. Sin embargo, ensearle al beb cmo prenderse a la mama adecuadamente es la mejor manera de evitar que se sienta molesto por tragar aire mientras se alimenta. Signos de que el beb se ha prendido adecuadamente al pezn:  Tironea o succiona de modo silencioso, sin causarle dolor.  Se escucha que traga cada 3 o 4 succiones.  Hay movimientos musculares por arriba y por delante de sus odos al succionar. Signos de que el beb no se ha prendido adecuadamente al pezn:  Hace ruidos de succin o de chasquido mientras se alimenta.  Siente dolor en el pezn. Si cree que el beb no se prendi correctamente, deslice el dedo en la comisura de la boca y colquelo entre las encas del beb para interrumpir la succin. Intente comenzar a amamantar nuevamente. Signos de lactancia materna exitosa Signos del beb:  Disminuye gradualmente el nmero de succiones o cesa la succin por completo.  Se duerme.  Relaja el cuerpo.  Retiene una pequea cantidad de leche en la boca.  Se desprende solo del pecho. Signos que presenta usted:  Las mamas han aumentado la firmeza, el peso y el tamao 1 a 3 horas despus de amamantar.  Estn ms blandas inmediatamente despus de amamantar.  Un aumento del volumen de leche, y tambin un cambio en su consistencia y color se producen hacia el quinto da de lactancia materna.  Los pezones no duelen, ni estn agrietados ni sangran. Signos de que su beb recibe la cantidad de leche suficiente  Mojar por lo menos 1 o 2 paales durante las primeras 24  horas despus del nacimiento.  Mojar por lo menos 5 o 6 paales cada 24 horas durante la primera semana despus del nacimiento. La orina debe ser transparente o de color amarillo plido a los 5 das despus del nacimiento.  Mojar entre 6 y 8 paales cada 24 horas a medida que el beb sigue creciendo y desarrollndose.  Defeca al menos 3 veces en 24 horas a los 5 das de vida. La materia fecal debe ser blanda y amarillenta.  Defeca al menos 3 veces en 24 horas a los 7 das de vida. La materia fecal debe ser grumosa y amarillenta.  No registra una prdida de peso mayor del 10% del peso al nacer durante los primeros 3 das de vida.  Aumenta de peso un promedio de 4 a 7onzas (113 a 198g) por semana despus de los 4 das de vida.  Aumenta de peso, diariamente, de manera uniforme a partir de los 5 das de vida, sin registrar prdida de peso despus de las 2semanas de vida. Despus de alimentarse, es posible que el beb regurgite una pequea cantidad. Esto es frecuente. FRECUENCIA Y DURACIN DE LA LACTANCIA MATERNA El amamantamiento frecuente la ayudar a producir ms leche y a prevenir problemas de dolor en los pezones e hinchazn en las mamas. Alimente al beb cuando muestre signos de hambre o si siente la necesidad de reducir la congestin de las mamas. Esto se denomina "lactancia a demanda". Evite el uso del chupete mientras trabaja para establecer la lactancia (las primeras 4 a 6 semanas despus del nacimiento del beb). Despus de este perodo, podr ofrecerle un chupete. Las investigaciones demostraron que el uso del chupete durante el primer ao de vida del beb disminuye el riesgo de desarrollar el sndrome de muerte sbita del lactante (SMSL). Permita que el nio se alimente en cada mama todo lo que desee. Contine amamantando al beb hasta que haya terminado de alimentarse. Cuando   el beb se desprende o se queda dormido mientras se est alimentando de la primera mama, ofrzcale la segunda.  Debido a que, con frecuencia, los recin nacidos permanecen somnolientos las primeras semanas de vida, es posible que deba despertar al beb para alimentarlo. Los horarios de lactancia varan de un beb a otro. Sin embargo, las siguientes reglas pueden servir como gua para ayudarla a garantizar que el beb se alimenta adecuadamente:  Se puede amamantar a los recin nacidos (bebs de 4 semanas o menos de vida) cada 1 a 3 horas.  No deben transcurrir ms de 3 horas durante el da o 5 horas durante la noche sin que se amamante a los recin nacidos.  Debe amamantar al beb 8 veces como mnimo en un perodo de 24 horas, hasta que comience a introducir slidos en su dieta, a los 6 meses de vida aproximadamente. EXTRACCIN DE LECHE MATERNA La extraccin y el almacenamiento de la leche materna le permiten asegurarse de que el beb se alimente exclusivamente de leche materna, aun en momentos en los que no puede amamantar. Esto tiene especial importancia si debe regresar al trabajo en el perodo en que an est amamantando o si no puede estar presente en los momentos en que el beb debe alimentarse. Su asesor en lactancia puede orientarla sobre cunto tiempo es seguro almacenar leche materna. El sacaleche es un aparato que le permite extraer leche de la mama a un recipiente estril. Luego, la leche materna extrada puede almacenarse en un refrigerador o congelador. Algunos sacaleches son manuales, mientras que otros son elctricos. Consulte a su asesor en lactancia qu tipo ser ms conveniente para usted. Los sacaleches se pueden comprar; sin embargo, algunos hospitales y grupos de apoyo a la lactancia materna alquilan sacaleches mensualmente. Un asesor en lactancia puede ensearle cmo extraer leche materna manualmente, en caso de que prefiera no usar un sacaleche. CMO CUIDAR LAS MAMAS DURANTE LA LACTANCIA MATERNA Los pezones se secan, agrietan y duelen durante la lactancia materna. Las siguientes  recomendaciones pueden ayudarla a mantener las mamas humectadas y sanas:  Evite usar jabn en los pezones.  Use un sostn de soporte. Aunque no son esenciales, las camisetas sin mangas o los sostenes especiales para amamantar estn diseados para acceder fcilmente a las mamas, para amamantar sin tener que quitarse todo el sostn o la camiseta. Evite usar sostenes con aro o sostenes muy ajustados.  Seque al aire sus pezones durante 3 a 4minutos despus de amamantar al beb.  Utilice solo apsitos de algodn en el sostn para absorber las prdidas de leche. La prdida de un poco de leche materna entre las tomas es normal.  Utilice lanolina sobre los pezones luego de amamantar. La lanolina ayuda a mantener la humedad normal de la piel. Si usa lanolina pura, no tiene que lavarse los pezones antes de volver a alimentar al beb. La lanolina pura no es txica para el beb. Adems, puede extraer manualmente algunas gotas de leche materna y masajear suavemente esa leche sobre los pezones, para que la leche se seque al aire. Durante las primeras semanas despus de dar a luz, algunas mujeres pueden experimentar hinchazn en las mamas (congestin mamaria). La congestin puede hacer que sienta las mamas pesadas, calientes y sensibles al tacto. El pico de la congestin ocurre dentro de los 3 a 5 das despus del parto. Las siguientes recomendaciones pueden ayudarla a aliviar la congestin:  Vace por completo las mamas al amamantar o extraer leche. Puede aplicar calor hmedo en las mamas (  aplicar calor hmedo en las mamas (en la ducha o con toallas hmedas para manos) antes de Museum/gallery exhibitions officeramamantar o extraer WPS Resourcesleche. Esto aumenta la circulacin y [place] and the Grenadinesayuda a que la [place]leche fluya. Si el beb no vaca por completo las [address]amas cuando lo [address] Aveamamanta, extraiga la Harperleche restante despus de que haya finalizado.  Use un sostn ajustado (para amamantar o comn) o una camiseta sin mangas durante 1 o 2 das para indicar al cuerpo que disminuya ligeramente la produccin de  [place].  Aplique compresas de hielo Yahoo! Incsobre las mamas, a menos que le resulte demasiado incmodo.  Asegrese de que el beb est prendido y se encuentre en la posicin correcta mientras lo alimenta. Si la congestin persiste luego de 48 horas o despus de seguir estas recomendaciones, comunquese con su mdico o un Holiday representativeasesor en lactancia. RECOMENDACIONES GENERALES PARA EL CUIDADO DE LA SALUD DURANTE LA LACTANCIA MATERNA  Consuma alimentos saludables. Alterne comidas y colaciones, y coma 3 de cada una por da. Dado que lo que come Danaher Corporationafecta la leche materna, es posible que algunas comidas hagan que su beb se vuelva ms irritable de lo habitual. Evite comer este tipo de alimentos si percibe que afectan de manera negativa al beb.  Beba leche, jugos de fruta y agua para Patent examinersatisfacer su sed (aproximadamente 10 vasos al Futures traderda).  Descanse con frecuencia, reljese y tome sus vitaminas prenatales para evitar la fatiga, el estrs y la anemia.  Contine con los autocontroles de la mama.  Evite Product managermasticar y fumar tabaco. Las sustancias qumicas de los cigarrillos que pasan a la leche materna y la exposicin al humo ambiental del tabaco pueden daar al beb.  No consuma alcohol ni drogas, incluida la marihuana. Algunos medicamentos, que pueden ser perjudiciales para el beb, pueden pasar a travs de la Colgate Palmoliveleche materna. Es importante que consulte a su mdico antes de Medical sales representativetomar cualquier medicamento, incluidos todos los medicamentos recetados y de [place]venta libre, as como los suplementos vitamnicos y herbales. Puede quedar embarazada durante la lactancia. Si desea controlar la natalidad, consulte a su mdico cules son las opciones ms seguras para el beb. SOLICITE ATENCIN MDICA SI:  Usted siente que quiere dejar de Museum/gallery exhibitions officeramamantar o se siente frustrada con la lactancia.  Siente dolor en las mamas o en los pezones.  Sus pezones estn agrietados o Water quality scientistsangran.  Sus pechos estn irritados, sensibles o calientes.  Tiene un rea  hinchada en cualquiera de las mamas.  Siente escalofros o fiebre.  Tiene nuseas o vmitos.  Presenta una secrecin de otro lquido distinto de la leche materna de los pezones.  Sus mamas no se llenan antes de Museum/gallery exhibitions officeramamantar al beb para el quinto da despus del [place]parto.  Se siente triste y deprimida.  El beb est demasiado somnoliento como para comer bien.  El beb tiene problemas para dormir.  Moja menos de 3 paales en 24 horas.  Defeca menos de 3 veces en 24 horas.  La piel del beb o la parte blanca de los ojos se vuelven amarillentas.  El beb no ha aumentado de Vashonpeso a los [address] das de [place]vida. SOLICITE ATENCIN MDICA DE INMEDIATO SI:  El beb est muy cansado Retail buyer(letargo) y no se quiere despertar para comer.  Le sube la fiebre sin causa. Esta informacin no tiene Theme park managercomo fin reemplazar el consejo del mdico. Asegrese de hacerle al mdico cualquier pregunta que tenga. Document Released: 03/31/2005 Document Revised: 07/23/2015 Document Reviewed: 09/22/2012 Elsevier Interactive Patient Education  2017 [employer] Inc[employer] Vaginal Birth After Cesarean Delivery Vaginal birth after  cesarean delivery (VBAC) is giving birth vaginally after previously delivering a baby by a cesarean. In the past, if a woman had a cesarean delivery, all births afterward would be done by cesarean delivery. This is no longer true. It can be safe for the mother to try a vaginal delivery after having a cesarean delivery. It is important to discuss VBAC with your health care provider early in the pregnancy so you can understand the risks, benefits, and options. It will give you time to decide what is best in your particular case. The final decision about whether to have a VBAC or repeat cesarean delivery should be between you and your health care provider. Any changes in your health or your baby's health during your pregnancy may make it necessary to change your initial decision about VBAC. Women who plan to have a VBAC should  check with their health care provider to be sure that:  The previous cesarean delivery was done with a low transverse uterine cut (incision) (not a vertical classical incision).  The birth canal is big enough for the baby.  There were no other operations on the uterus.  An electronic fetal monitor (EFM) will be on at all times during labor.  An operating room will be available and ready in case an emergency cesarean delivery is needed.  A health care provider and surgical nursing staff will be available at all times during labor to be ready to do an emergency delivery cesarean if necessary.  An anesthesiologist will be present in case an emergency cesarean delivery is needed.  The nursery is prepared and has adequate personnel and necessary equipment available to care for the baby in case of an emergency cesarean delivery. Benefits of VBAC  Shorter stay in the hospital.  Avoidance of risks associated with cesarean delivery, such as:  Surgical complications, such as opening of the incision or hernia in the incision.  Injury to other organs.  Fever. This can occur if an infection develops after surgery. It can also occur as a reaction to the medicine given to make you numb during the surgery.  Less blood loss and need for blood transfusions.  Lower risk of blood clots and infection.  Shorter recovery.  Decreased risk for having to remove the uterus (hysterectomy).  Decreased risk for the placenta to completely or partially cover the opening of the uterus (placenta previa) with a future pregnancy.  Decrease risk in future labor and delivery. Risks of a VBAC  Tearing (rupture) of the uterus. This is occurs in less than 1% of VBACs. The risk of this happening is higher if:  Steps are taken to begin the labor process (induce labor) or stimulate or strengthen contractions (augment labor).  Medicine is used to soften (ripen) the cervix.  Having to remove the uterus  (hysterectomy) if it ruptures. VBAC should not be done if:  The previous cesarean delivery was done with a vertical (classical) or T-shaped incision or you do not know what kind of incision was made.  You had a ruptured uterus.  You have had certain types of surgery on your uterus, such as removal of uterine fibroids. Ask your health care provider about other types of surgeries that prevent you from having a VBAC.  You have certain medical or childbirth (obstetrical) problems.  There are problems with the baby.  You have had two previous cesarean deliveries and no vaginal deliveries. Other facts to know about VBAC:  It is safe to have an epidural anesthetic  with VBAC.  It is safe to turn the baby from a breech position (attempt an external cephalic version).  It is safe to try a VBAC with twins.  VBAC may not be successful if your baby weights 8.8 lb (4 kg) or more. However, weight predictions are not always accurate and should not be used alone to decide if VBAC is right for you.  There is an increased failure rate if the time between the cesarean delivery and VBAC is less than 19 months.  Your health care provider may advise against a VBAC if you have preeclampsia (high blood pressure, protein in the urine, and swelling of face and extremities).  VBAC is often successful if you previously gave birth vaginally.  VBAC is often successful when the labor starts spontaneously before the due date.  Delivering a baby through a VBAC is similar to having a normal spontaneous vaginal delivery. This information is not intended to replace advice given to you by your health care provider. Make sure you discuss any questions you have with your health care provider. Document Released: 09/21/2006 Document Revised: 09/06/2015 Document Reviewed: 10/28/2012 Elsevier Interactive Patient Education  2017 [employer]

## 2016-03-25 ENCOUNTER — Inpatient Hospital Stay (HOSPITAL_COMMUNITY)
Admission: AD | Admit: 2016-03-25 | Discharge: 2016-03-27 | DRG: 775 | Disposition: A | Discharge status: Home / Self Care | Payer: Medicaid Other | Source: Ambulatory Visit | Attending: Obstetrics and Gynecology | Admitting: Obstetrics and Gynecology

## 2016-03-25 ENCOUNTER — Encounter (HOSPITAL_COMMUNITY): Payer: Self-pay | Admitting: *Deleted

## 2016-03-25 ENCOUNTER — Inpatient Hospital Stay (HOSPITAL_COMMUNITY): Payer: Medicaid Other | Admitting: Anesthesiology

## 2016-03-25 DIAGNOSIS — O1494 Unspecified pre-eclampsia, complicating childbirth: Principal | ICD-10-CM | POA: Diagnosis present

## 2016-03-25 DIAGNOSIS — Z8249 Family history of ischemic heart disease and other diseases of the circulatory system: Secondary | ICD-10-CM | POA: Diagnosis not present

## 2016-03-25 DIAGNOSIS — O34211 Maternal care for low transverse scar from previous cesarean delivery: Secondary | ICD-10-CM | POA: Diagnosis present

## 2016-03-25 DIAGNOSIS — O9962 Diseases of the digestive system complicating childbirth: Secondary | ICD-10-CM | POA: Diagnosis present

## 2016-03-25 DIAGNOSIS — O34219 Maternal care for unspecified type scar from previous cesarean delivery: Secondary | ICD-10-CM

## 2016-03-25 DIAGNOSIS — K219 Gastro-esophageal reflux disease without esophagitis: Secondary | ICD-10-CM | POA: Diagnosis present

## 2016-03-25 DIAGNOSIS — Z833 Family history of diabetes mellitus: Secondary | ICD-10-CM

## 2016-03-25 DIAGNOSIS — Z3A38 38 weeks gestation of pregnancy: Secondary | ICD-10-CM

## 2016-03-25 DIAGNOSIS — O1493 Unspecified pre-eclampsia, third trimester: Secondary | ICD-10-CM | POA: Diagnosis present

## 2016-03-25 LAB — PROTEIN / CREATININE RATIO, URINE
CREATININE, URINE: 51 mg/dL
PROTEIN CREATININE RATIO: 0.98 mg/mg{creat} — AB (ref 0.00–0.15)
TOTAL PROTEIN, URINE: 50 mg/dL

## 2016-03-25 LAB — CBC
HEMATOCRIT: 36.5 % (ref 36.0–46.0)
HEMOGLOBIN: 12.1 g/dL (ref 12.0–15.0)
MCH: 28.8 pg (ref 26.0–34.0)
MCHC: 33.2 g/dL (ref 30.0–36.0)
MCV: 86.9 fL (ref 78.0–100.0)
Platelets: 298 10*3/uL (ref 150–400)
RBC: 4.2 MIL/uL (ref 3.87–5.11)
RDW: 15.1 % (ref 11.5–15.5)
WBC: 10.7 10*3/uL — ABNORMAL HIGH (ref 4.0–10.5)

## 2016-03-25 LAB — TYPE AND SCREEN
ABO/RH(D): O POS
Antibody Screen: NEGATIVE

## 2016-03-25 LAB — RPR: RPR Ser Ql: NONREACTIVE

## 2016-03-25 MED ORDER — METHYLERGONOVINE MALEATE 0.2 MG PO TABS: 0.2000 mg | ORAL_TABLET | ORAL | Status: DC | PRN

## 2016-03-25 MED ORDER — ZOLPIDEM TARTRATE 5 MG PO TABS: 5.0000 mg | ORAL_TABLET | Freq: Every evening | ORAL | Status: DC | PRN

## 2016-03-25 MED ORDER — LIDOCAINE HCL (PF) 1 % IJ SOLN
INTRAMUSCULAR | Status: DC | PRN
  Administered 2016-03-25: 6 mL via EPIDURAL
  Administered 2016-03-25: 4 mL

## 2016-03-25 MED ORDER — ONDANSETRON HCL 4 MG PO TABS: 4.0000 mg | ORAL_TABLET | ORAL | Status: DC | PRN

## 2016-03-25 MED ORDER — SENNOSIDES-DOCUSATE SODIUM 8.6-50 MG PO TABS
2.0000 | ORAL_TABLET | ORAL | Status: DC
  Administered 2016-03-26 (×2): 2 via ORAL
  Filled 2016-03-25 (×2): qty 2

## 2016-03-25 MED ORDER — FENTANYL 2.5 MCG/ML BUPIVACAINE 1/10 % EPIDURAL INFUSION (WH - ANES)
14.0000 mL/h | INTRAMUSCULAR | Status: DC | PRN
  Administered 2016-03-25: 14 mL/h via EPIDURAL
  Filled 2016-03-25: qty 100

## 2016-03-25 MED ORDER — PRENATAL MULTIVITAMIN CH
1.0000 | ORAL_TABLET | Freq: Every day | ORAL | Status: DC
  Administered 2016-03-26 – 2016-03-27 (×2): 1 via ORAL
  Filled 2016-03-25 (×2): qty 1

## 2016-03-25 MED ORDER — DIPHENHYDRAMINE HCL 25 MG PO CAPS: 25.0000 mg | ORAL_CAPSULE | Freq: Four times a day (QID) | ORAL | Status: DC | PRN

## 2016-03-25 MED ORDER — OXYTOCIN BOLUS FROM INFUSION
500.0000 mL | Freq: Once | INTRAVENOUS | Status: AC
  Administered 2016-03-25: 500 mL via INTRAVENOUS

## 2016-03-25 MED ORDER — ACETAMINOPHEN 325 MG PO TABS
650.0000 mg | ORAL_TABLET | ORAL | Status: DC | PRN
  Administered 2016-03-25 – 2016-03-26 (×2): 650 mg via ORAL
  Filled 2016-03-25 (×3): qty 2

## 2016-03-25 MED ORDER — METHYLERGONOVINE MALEATE 0.2 MG/ML IJ SOLN: 0.2000 mg | INTRAMUSCULAR | Status: DC | PRN

## 2016-03-25 MED ORDER — MEASLES, MUMPS & RUBELLA VAC ~~LOC~~ INJ
0.5000 mL | INJECTION | Freq: Once | SUBCUTANEOUS | Status: DC
Start: 2016-03-26 — End: 2016-03-27
  Filled 2016-03-25: qty 0.5

## 2016-03-25 MED ORDER — LIDOCAINE HCL (PF) 1 % IJ SOLN
30.0000 mL | INTRAMUSCULAR | Status: DC | PRN
  Administered 2016-03-25: 30 mL via SUBCUTANEOUS
  Filled 2016-03-25: qty 30

## 2016-03-25 MED ORDER — IBUPROFEN 600 MG PO TABS
600.0000 mg | ORAL_TABLET | Freq: Four times a day (QID) | ORAL | Status: DC
  Administered 2016-03-25 – 2016-03-27 (×9): 600 mg via ORAL
  Filled 2016-03-25 (×9): qty 1

## 2016-03-25 MED ORDER — EPHEDRINE 5 MG/ML INJ
10.0000 mg | INTRAVENOUS | Status: DC | PRN
  Filled 2016-03-25: qty 4

## 2016-03-25 MED ORDER — COCONUT OIL OIL: 1.0000 "application " | TOPICAL_OIL | Status: DC | PRN

## 2016-03-25 MED ORDER — SIMETHICONE 80 MG PO CHEW: 80.0000 mg | CHEWABLE_TABLET | ORAL | Status: DC | PRN

## 2016-03-25 MED ORDER — ONDANSETRON HCL 4 MG/2ML IJ SOLN: 4.0000 mg | INTRAMUSCULAR | Status: DC | PRN

## 2016-03-25 MED ORDER — OXYCODONE HCL 5 MG PO TABS
5.0000 mg | ORAL_TABLET | ORAL | Status: DC | PRN
  Administered 2016-03-26: 5 mg via ORAL
  Filled 2016-03-25: qty 1

## 2016-03-25 MED ORDER — OXYTOCIN 40 UNITS IN LACTATED RINGERS INFUSION - SIMPLE MED
2.5000 [IU]/h | INTRAVENOUS | Status: DC
  Administered 2016-03-25: 2.5 [IU]/h via INTRAVENOUS
  Filled 2016-03-25: qty 1000

## 2016-03-25 MED ORDER — LACTATED RINGERS IV SOLN: 500.0000 mL | Freq: Once | INTRAVENOUS | Status: DC

## 2016-03-25 MED ORDER — PHENYLEPHRINE 40 MCG/ML (10ML) SYRINGE FOR IV PUSH (FOR BLOOD PRESSURE SUPPORT)
80.0000 ug | PREFILLED_SYRINGE | INTRAVENOUS | Status: DC | PRN
Start: 2016-03-25 — End: 2016-03-25
  Filled 2016-03-25: qty 5

## 2016-03-25 MED ORDER — ONDANSETRON HCL 4 MG/2ML IJ SOLN: 4.0000 mg | Freq: Four times a day (QID) | INTRAMUSCULAR | Status: DC | PRN

## 2016-03-25 MED ORDER — LACTATED RINGERS IV SOLN: 500.0000 mL | INTRAVENOUS | Status: DC | PRN

## 2016-03-25 MED ORDER — LACTATED RINGERS IV SOLN
INTRAVENOUS | Status: DC
  Administered 2016-03-25 (×2): via INTRAVENOUS

## 2016-03-25 MED ORDER — DIPHENHYDRAMINE HCL 50 MG/ML IJ SOLN: 12.5000 mg | INTRAMUSCULAR | Status: DC | PRN

## 2016-03-25 MED ORDER — PHENYLEPHRINE 40 MCG/ML (10ML) SYRINGE FOR IV PUSH (FOR BLOOD PRESSURE SUPPORT)
80.0000 ug | PREFILLED_SYRINGE | INTRAVENOUS | Status: DC | PRN
  Filled 2016-03-25: qty 10
  Filled 2016-03-25: qty 5

## 2016-03-25 MED ORDER — BENZOCAINE-MENTHOL 20-0.5 % EX AERO: 1.0000 "application " | INHALATION_SPRAY | CUTANEOUS | Status: DC | PRN

## 2016-03-25 MED ORDER — WITCH HAZEL-GLYCERIN EX PADS: 1.0000 "application " | MEDICATED_PAD | CUTANEOUS | Status: DC | PRN

## 2016-03-25 MED ORDER — TETANUS-DIPHTH-ACELL PERTUSSIS 5-2.5-18.5 LF-MCG/0.5 IM SUSP: 0.5000 mL | Freq: Once | INTRAMUSCULAR | Status: DC

## 2016-03-25 MED ORDER — DIBUCAINE 1 % RE OINT: 1.0000 "application " | TOPICAL_OINTMENT | RECTAL | Status: DC | PRN

## 2016-03-25 MED ORDER — OXYCODONE-ACETAMINOPHEN 5-325 MG PO TABS: 1.0000 | ORAL_TABLET | ORAL | Status: DC | PRN

## 2016-03-25 MED ORDER — ACETAMINOPHEN 325 MG PO TABS: 650.0000 mg | ORAL_TABLET | ORAL | Status: DC | PRN

## 2016-03-25 MED ORDER — OXYCODONE-ACETAMINOPHEN 5-325 MG PO TABS: 2.0000 | ORAL_TABLET | ORAL | Status: DC | PRN

## 2016-03-25 MED ORDER — OXYCODONE HCL 5 MG PO TABS: 10.0000 mg | ORAL_TABLET | ORAL | Status: DC | PRN

## 2016-03-25 MED ORDER — SOD CITRATE-CITRIC ACID 500-334 MG/5ML PO SOLN: 30.0000 mL | ORAL | Status: DC | PRN

## 2016-03-25 MED ORDER — FENTANYL CITRATE (PF) 100 MCG/2ML IJ SOLN: 100.0000 ug | INTRAMUSCULAR | Status: DC | PRN

## 2016-03-25 NOTE — H&P (Signed)
LABOR AND DELIVERY ADMISSION HISTORY AND PHYSICAL NOTE  Tara [name] is a 35 y.o. female [id number] with IUP at 945w4d by LMP  presenting for IOL/TOLAC(VBAC x1)  for preeclampsia w/o severe features.   She reports positive fetal movement. She denies leakage of fluid or vaginal bleeding.  Prenatal History/Complications:  Past Medical History: Past Medical History:  Diagnosis Date  . Hypertension     Past Surgical History: Past Surgical History:  Procedure Laterality Date  . CESAREAN SECTION      Obstetrical History: OB History    Gravida Para Term Preterm AB Living   4 2 2  0 1 2   SAB TAB Ectopic Multiple Live Births   0 1 0 0 2      Social History: Social History   Social History  . Marital status: Single    Spouse name: N/A  . Number of children: N/A  . Years of education: N/A   Social History Main Topics  . Smoking status: Never Smoker  . Smokeless tobacco: Never Used  . Alcohol use No  . Drug use: No  . Sexual activity: Yes   Other Topics Concern  . None   Social History Narrative  . None    Family History: Family History  Problem Relation Age of Onset  . Diabetes Mother   . Hypertension Mother     Allergies: No Known Allergies  Prescriptions Prior to Admission  Medication Sig Dispense Refill Last Dose  . acetaminophen (TYLENOL) 325 MG tablet Take 650 mg by mouth every 6 (six) hours as needed for moderate pain or headache.   Past Month at Unknown time  . Prenatal Vit-Fe Fumarate-FA (PRENATAL MULTIVITAMIN) TABS tablet Take 1 tablet by mouth daily at 12 noon.   Past Week at Unknown time     Review of Systems   All systems reviewed and negative except as stated in HPI  Blood pressure (!) 160/101, pulse 72, temperature 97.3 F (36.3 C), temperature source Oral, resp. rate 16, weight 204 lb (92.5 kg), last menstrual period 06/29/2015, not currently breastfeeding. General appearance: alert, cooperative and appears stated age Lungs:  clear to auscultation bilaterally Heart: regular rate and rhythm Abdomen: soft, non-tender; bowel sounds normal Extremities: No calf swelling or tenderness Presentation: cephalic  Fetal monitoring: Cat I tracing Uterine activity: q484min Dilation: 4 Effacement (%): 80 Station: -2 Exam by:: Sarajane MarekS. Carrera, RNC   Prenatal labs: ABO, Rh: O/POS/-- (09/20 1533) Antibody: NEG (09/20 1533) Rubella: !Error! RPR: NON REAC (10/04 1628)  HBsAg: NEGATIVE (09/20 1533)  HIV: NONREACTIVE (10/04 1628)  GBS: NOT DETECTED (11/22 1450)  1 hr Glucola: 136 3h gtt:wnl Genetic screening:  declined Anatomy US: incomplete  Prenatal Transfer Tool  Maternal Diabetes: No Genetic Screening: Declined Maternal Ultrasounds/Referrals: Normal Fetal Ultrasounds or other Referrals:  None Maternal Substance Abuse:  No Significant Maternal Medications:  None Significant Maternal Lab Results: None  No results found for this or any previous visit (from the past 24 hour(s)).  Patient Active Problem List   Diagnosis Date Noted  . GERD (gastroesophageal reflux disease) 03/05/2016  . Elevated blood pressure affecting pregnancy in third trimester, antepartum 03/05/2016  . Previous cesarean delivery, antepartum condition or complication 02/04/2016  . Encounter for supervision of normal pregnancy 11/27/2015    Assessment: Tara [name] is a 35 y.o. [id number] at 9045w4d here for IOL/TOLAC for preeclampsia w/o severe features  #Labor:Anticipate SVD. Augmentation with pitocin #Pain: IV Pain meds prn/Epidural on request #FWB: Cat I tracing #ID:  GBS negative #  MOF: Breast/Bottle #MOC:-BTL (consent signed 02/04/16) #Circ:  declined  WALLACE, NOAH I, DO PGY-3 03/25/2016, 9:03 AM

## 2016-03-25 NOTE — Progress Notes (Addendum)
Patient ID: Tara [name], female   DOB: 04/19/1980, 35 y.o.   MRN: [id number]  LABOR PROGRESS NOTE  Tara [name] is a 35 y.o. [id number] at 582w4d  admitted for IOL/TOLAC for preeclampsia w/o severe features.  Subjective: Patient comfortable. In agreement to proceed with AROM  Objective: BP 138/81   Pulse 76   Temp 97.8 F (36.6 C) (Oral)   Resp 20   Ht 5\' 2"  (1.575 m)   Wt 205 lb (93 kg)   LMP 06/29/2015   SpO2 100%   BMI 37.49 kg/m  or  Vitals:   03/25/16 1130 03/25/16 1201 03/25/16 1230 03/25/16 1301  BP: 137/79 139/81 (!) 145/81 138/81  Pulse: 74 75 80 76  Resp: 19 20 18 20   Temp:      TempSrc:      SpO2:      Weight:      Height:         Dilation: 8 Effacement (%): 100 Cervical Position: Posterior Station: -1 Presentation: Vertex Exam by:: Dr. Chales Abrahamsyson and C. Joseph ArtWoods, RN  Labs: Lab Results  Component Value Date   WBC 10.7 (H) 03/25/2016   HGB 12.1 03/25/2016   HCT 36.5 03/25/2016   MCV 86.9 03/25/2016   PLT 298 03/25/2016    Patient Active Problem List   Diagnosis Date Noted  . Preeclampsia, third trimester 03/25/2016  . GERD (gastroesophageal reflux disease) 03/05/2016  . Elevated blood pressure affecting pregnancy in third trimester, antepartum 03/05/2016  . Previous cesarean delivery, antepartum condition or complication 02/04/2016  . Encounter for supervision of normal pregnancy 11/27/2015    Assessment / Plan: 35 y.o. [id number] at 7482w4d here for IOL/TOLAC for preeclampsia without severe features  Labor: No cervical change in 1 hr. AROM with light to moderate meconium.  Fetal Wellbeing:  Cat I Pain Control:  epidural Anticipated MOD:  NSVD  Clearance CootsAndrew Adonay Scheier, MD 03/25/2016, 1:18 PM

## 2016-03-25 NOTE — Anesthesia Procedure Notes (Addendum)
Epidural Patient location during procedure: OB Start time: 03/25/2016 9:50 AM End time: 03/25/2016 10:15 AM  Preanesthetic Checklist Completed: patient identified, site marked, surgical consent, pre-op evaluation, timeout performed, IV checked, risks and benefits discussed and monitors and equipment checked  Epidural Patient position: sitting Prep: site prepped and draped and DuraPrep Patient monitoring: continuous pulse ox and blood pressure Approach: midline Location: L3-L4 Injection technique: LOR air  Needle:  Needle type: Tuohy  Needle gauge: 17 G Needle length: 9 cm and 9 Needle insertion depth: 5 cm cm Catheter type: closed end flexible Catheter size: 19 Gauge Catheter at skin depth: 10 cm Test dose: negative  Assessment Events: blood not aspirated, injection not painful, no injection resistance, negative IV test and no paresthesia  Additional Notes Dosing of Epidural:  1st dose, through catheter ............................................Marland Kitchen.  Xylocaine 40 mg  2nd dose, through catheter, after waiting 3 minutes........Marland Kitchen.Xylocaine 60 mg    As each dose occurred, patient was free of IV sx; and patient exhibited no evidence of SA injection.  Patient is more comfortable after epidural dosed. Please see RN's note for documentation of vital signs,and FHR which are stable.  Patient reminded not to try to ambulate with numb legs, and that an RN must be present when she attempts to get up.

## 2016-03-25 NOTE — Anesthesia Pain Management Evaluation Note (Signed)
  CRNA Pain Management Visit Note  Patient: Tara PitcherRosalba Ortiz-Rodriguez, 35 y.o., female  "Hello I am a member of the anesthesia team at [facility]. We have an anesthesia team available at all times to provide care throughout the hospital, including epidural management and anesthesia for C-section. I don't know your plan for the delivery whether it a natural birth, water birth, IV sedation, nitrous supplementation, doula or epidural, but we want to meet your pain goals."   1.Was your pain managed to your expectations on prior hospitalizations?   Yes   2.What is your expectation for pain management during this hospitalization?     Epidural  3.How can we help you reach that goal? MDA in room, preparing to place epidural.  Record the patient's initial score and the patient's pain goal.   Pain: 9  Pain Goal: 3 [facility] wants you to be able to say your pain was always managed very well.  Guiselle Mian L 03/25/2016

## 2016-03-25 NOTE — Anesthesia Preprocedure Evaluation (Signed)
Anesthesia Evaluation  Patient identified by MRN, date of birth, ID band Patient awake    Reviewed: Allergy & Precautions, H&P , Patient's Chart, lab work & pertinent test results  Airway Mallampati: II  TM Distance: >3 FB Neck ROM: full    Dental  (+) Teeth Intact   Pulmonary    breath sounds clear to auscultation       Cardiovascular hypertension,  Rhythm:regular Rate:Normal     Neuro/Psych    GI/Hepatic   Endo/Other    Renal/GU      Musculoskeletal   Abdominal   Peds  Hematology   Anesthesia Other Findings  TOLAC PEcc MO     Reproductive/Obstetrics (+) Pregnancy                             Anesthesia Physical Anesthesia Plan  ASA: III  Anesthesia Plan: Epidural   Post-op Pain Management:    Induction:   Airway Management Planned:   Additional Equipment:   Intra-op Plan:   Post-operative Plan:   Informed Consent: I have reviewed the patients History and Physical, chart, labs and discussed the procedure including the risks, benefits and alternatives for the proposed anesthesia with the patient or authorized representative who has indicated his/her understanding and acceptance.   Dental Advisory Given  Plan Discussed with:   Anesthesia Plan Comments: (Labs checked- platelets confirmed with RN in room. Fetal heart tracing, per RN, reported to be stable enough for sitting procedure. Discussed epidural, and patient consents to the procedure:  included risk of possible headache,backache, failed block, allergic reaction, and nerve injury. This patient was asked if she had any questions or concerns before the procedure started.)        Anesthesia Quick Evaluation

## 2016-03-25 NOTE — Progress Notes (Signed)
Interpreter to room to introduce nurse, go over plan of care and times of assessments for mother and infant. Discussed mother's plan to feed baby - would like to solely bottle feed. Mother understands when and how to call for help, how to keep track of infants feedings and wets/dirty diapers, and how to get a hold of interpreter if needed.

## 2016-03-26 NOTE — Progress Notes (Signed)
UR chart review completed.  

## 2016-03-26 NOTE — Plan of Care (Signed)
Problem: Activity: Goal: Ability to tolerate increased activity will improve Outcome: Progressing Pt is able to get up in room ad lib.  Problem: Life Cycle: Goal: Risk for postpartum hemorrhage will decrease Outcome: Progressing Pt fundus firm u/e - small lochia with no clots noted. VSS.  Problem: Pain Management: Goal: General experience of comfort will improve and pain level will decrease Outcome: Progressing Patient given scheduled ibuprofen per MAR. Prn tylenol also given due to cramping pains with breast feeding.   Problem: Urinary Elimination: Goal: Ability to reestablish a normal urinary elimination pattern will improve Outcome: Progressing Pt is able to void as needed.

## 2016-03-26 NOTE — Plan of Care (Signed)
Problem: Education: Goal: Knowledge of condition will improve Outcome: Completed/Met Date Met: 03/26/16 Used Eda Royal for interpretation this morning for education. Encouraged mother to empty her bladder frequently in order to decrease cramping and prevent heavier bleeding. Also discussed pain control medications and baby care.   Problem: Nutritional: Goal: Mothers verbalization of comfort with breastfeeding process will improve Outcome: Not Applicable Date Met: 22/17/98 Mother is bottle feeding only. Educated on breast care with [employer].

## 2016-03-26 NOTE — Progress Notes (Addendum)
Post Partum Day 1 Subjective: no complaints, up ad lib, voiding, tolerating PO and + flatus  Objective: Blood pressure 140/80, pulse 83, temperature 97.9 F (36.6 C), temperature source Oral, resp. rate 18, height 5\' 2"  (1.575 m), weight 205 lb (93 kg), last menstrual period 06/29/2015, SpO2 100 %, unknown if currently breastfeeding.  Physical Exam:  General: alert, cooperative and no distress Lochia: appropriate Uterine Fundus: firm Incision: healing well DVT Evaluation: No evidence of DVT seen on physical exam.   Recent Labs  03/25/16 0840  HGB 12.1  HCT 36.5    Assessment/Plan: Plan for discharge tomorrow No circ Had planned BTL if C/S, otherwise Mirena or Nexplanon   LOS: 1 day   [facility] Asc LLCWILLIAMS,MARIE 03/26/2016, 9:25 AM

## 2016-03-26 NOTE — Anesthesia Postprocedure Evaluation (Signed)
Anesthesia Post Note  Patient: Tara [name]  Procedure(s) Performed: * No procedures listed *  Patient location during evaluation: Mother Baby Anesthesia Type: Epidural Level of consciousness: awake and alert Pain management: satisfactory to patient Vital Signs Assessment: post-procedure vital signs reviewed and stable Respiratory status: respiratory function stable Cardiovascular status: stable Postop Assessment: no headache, no backache, epidural receding, patient able to bend at knees, no signs of nausea or vomiting and adequate PO intake Anesthetic complications: no     Last Vitals:  Vitals:   03/26/16 0236 03/26/16 0541  BP: 135/80 140/80  Pulse:  83  Resp:  18  Temp:  36.6 C    Last Pain:  Vitals:   03/26/16 0600  TempSrc:   PainSc: 1    Pain Goal:                 [name]

## 2016-03-26 NOTE — Progress Notes (Signed)
Post Partum Day 1 Subjective: no complaints, up ad lib, voiding, tolerating PO and + flatus . Bottle feeding Objective: Blood pressure 140/80, pulse 83, temperature 97.9 F (36.6 C), temperature source Oral, resp. rate 18, height 5\' 2"  (1.575 m), weight 205 lb (93 kg), last menstrual period 06/29/2015, SpO2 100 %, unknown if currently breastfeeding.  Physical Exam:  General: alert, cooperative and no distress Lochia: appropriate Uterine Fundus: firm DVT Evaluation: No evidence of DVT seen on physical exam.   Recent Labs  03/25/16 0840  HGB 12.1  HCT 36.5    Assessment/Plan: Plan for discharge tomorrow   LOS: 1 day   Clearance Cootsndrew Vivion Romano 03/26/2016, 7:16 AM

## 2016-03-27 ENCOUNTER — Encounter (HOSPITAL_COMMUNITY): Payer: Self-pay | Admitting: *Deleted

## 2016-03-27 DIAGNOSIS — O1494 Unspecified pre-eclampsia, complicating childbirth: Secondary | ICD-10-CM

## 2016-03-27 DIAGNOSIS — K219 Gastro-esophageal reflux disease without esophagitis: Secondary | ICD-10-CM

## 2016-03-27 DIAGNOSIS — O9962 Diseases of the digestive system complicating childbirth: Secondary | ICD-10-CM

## 2016-03-27 DIAGNOSIS — O34211 Maternal care for low transverse scar from previous cesarean delivery: Secondary | ICD-10-CM

## 2016-03-27 DIAGNOSIS — Z3A38 38 weeks gestation of pregnancy: Secondary | ICD-10-CM

## 2016-03-27 MED ORDER — IBUPROFEN 600 MG PO TABS: 600.0000 mg | ORAL_TABLET | Freq: Four times a day (QID) | ORAL | 0 refills | Status: DC

## 2016-03-27 NOTE — Progress Notes (Signed)
Discharge instructions reviewed with patient with spanish interpreter at bedside, pt. Verbalized understanding of instructions.  Patient stable and ambulating to discharge area with newborn in car seat, visitor and  with nurse tech.

## 2016-03-27 NOTE — Discharge Summary (Signed)
Obstetric Discharge Summary Reason for Admission: induction of labor Prenatal Procedures: Preeclampsia Intrapartum Procedures: spontaneous vaginal delivery Postpartum Procedures: none Complications-Operative and Postpartum: vaginal laceration Hemoglobin  Date Value Ref Range Status  03/25/2016 12.1 12.0 - 15.0 g/dL Final   HCT  Date Value Ref Range Status  03/25/2016 36.5 36.0 - 46.0 % Final    Physical Exam:  General: alert, cooperative and no distress Lochia: appropriate Uterine Fundus: firm DVT Evaluation: No evidence of DVT seen on physical exam.  Discharge Diagnoses: Term Pregnancy-delivered  Discharge Information: Date: 03/27/2016 Activity: unrestricted Diet: routine Medications: None Condition: stable Instructions: refer to practice specific booklet Discharge to: home Follow-up Information    [facility] Follow up in 6 week(s).   Why:  Postpartum visit Contact information: [address] [place] [id number] [phone]       [facility] Follow up.   Why:  as needed in emergencies Contact information: [address] [id number] [place] [phone]          Newborn Data: Live born female  Birth Weight: 7 lb 14.6 oz (3589 g) APGAR: 7, 9  Home with mother.  Clearance Cootsndrew Tyson 03/27/2016, 10:28 AM   I was present for the exam and agree with above.  [place] Nasiyah Laverdiere, CNM 03/27/2016 10:34 AM

## 2016-03-27 NOTE — Discharge Instructions (Signed)
Preeclampsia y eclampsia (Preeclampsia and Eclampsia) La preeclampsia es una afeccin grave que solo se manifiesta durante el [facility]. Tambin se la conoce como toxemia del Media planner. Esta afeccin provoca el aumento de la presin arterial junto con otros sntomas, como hinchazn y dolores de [place], que pueden aparecer a medida que la preeclampsia empeora. La preeclampsia puede presentarse a las 20semanas de gestacin o posteriormente.  El diagnstico y el tratamiento tempranos de esta afeccin son muy importantes. Si no se la trata de inmediato, puede causarles problemas graves a usted y al beb. Una complicacin que la preeclampsia puede provocar es la eclampsia, una afeccin que causa temblores o contracciones musculares (convulsiones) en la [place]. Provocar el parto del beb es el mejor tratamiento para la preeclampsia o la eclampsia.  FACTORES DE RIESGO La causa de la preeclampsia no se conoce. Puede tener ms probabilidades de desarrollar la afeccin si tiene ciertos factores de Sales executive. Estos incluyen:   Estar embarazada por primera vez.  Haber tenido preeclampsia en un embarazo anterior.  Tener antecedentes familiares de preeclampsia.  Tener presin arterial alta.  Estar embarazada de gemelos o trillizos.  Tener 35aos o ms.  Pertenecer a Advertising copywriter.  Tener enfermedad renal o diabetes.  Tener enfermedades, como lupus o enfermedades de la [place].  Tener mucho sobrepeso (obesidad). [place] primeros signos de preeclampsia son:  Hipertensin arterial.  Aumento de las protenas en la orina. El Management consultant en cada visita prenatal. Otros sntomas que pueden presentarse incluyen:   Dolor de cabeza intenso  Aumento repentino de peso.  Hinchazn de las manos, el [place] y [employer].  Malestar estomacal (nuseas) y (vmitos).  Problemas visuales (visin doble o borrosa).  Adormecimiento del rostro, los [place], las piernas y los  pies.  Mareos.  Hablar arrastrando las palabras.  Sensibilidad a las luces brillantes.  Dolor abdominal. DIAGNSTICO  No hay pruebas diagnsticas para la preeclampsia. El mdico le har preguntas sobre los sntomas y verificar la presencia de signos de preeclampsia durante las visitas prenatales. Tambin pueden hacerle exmenes que incluyen:  Anlisis de [place].  Anlisis de [place].  Control de la frecuencia cardaca del beb.  Control de la salud del beb y de la placenta mediante el uso de imgenes que se generan con ondas sonoras (ecografa). TRATAMIENTO  Puede planificar el mejor abordaje de tratamiento junto con el mdico. Es muy importante que concurra a todas las visitas prenatales. Si tiene un riesgo ms alto de tener preeclampsia, tal vez necesite exmenes prenatales con ms frecuencia.  El mdico tambin puede indicarle que haga reposo en cama.  Tal vez deba comer la menor cantidad de sal posible.  Es posible que tenga que tomar medicamentos para bajar la presin arterial si la afeccin no responde a medidas ms conservadoras.  Quizs deba [employer] hospital si la afeccin es grave. All, el tratamiento estar centrado en el control de la presin arterial y la retencin de lquidos. Puede que tambin tenga que tomar medicamentos para evitar las convulsiones.  Si la afeccin empeora, tal vez sea necesario adelantar el parto para protegerlos a usted y al beb. Pueden provocarle el trabajo de parto con medicamentos (inducirse) o hacerle una cesrea.  Generalmente, la preeclampsia desaparece despus del parto. INSTRUCCIONES PARA EL CUIDADO EN EL HOGAR   Tome los medicamentos de venta libre o recetados solamente segn las indicaciones del mdico.  Acustese sobre el lado izquierdo mientras hace reposo; de [place], se quita la  presin del beb.  Levante los pies mientras hace reposo.  Realice actividad fsica con regularidad. Consulte al mdico qu tipo de  ejercicio es seguro para usted.  Evite la cafena y el alcohol.  No fume.  Beba de 6 a 8vasos de Warehouse manager.  Coma una dieta equilibrada con bajo contenido de sal. No agregue sal a las comidas.  Evite las situaciones estresantes tanto como sea posible.  Descanse y duerma lo suficiente.  Concurra a todas las visitas prenatales y hgase todos los anlisis segn lo programado. SOLICITE ATENCIN MDICA SI:  Aumenta de peso ms de lo esperado.  Tiene dolores de cabeza, dolor abdominal o nuseas.  Aparecen ms hematomas que lo normal.  Se siente mareada o tiene vahdos. SOLICITE ATENCIN MDICA DE INMEDIATO SI:   Aparece una hinchazn repentina o tiene una zona muy hinchada en cualquier parte del cuerpo. Esto suele ocurrir en las piernas.  Tiene un aumento de peso de 5libras (2,3kg) o ms en una semana.  Tiene dolor de cabeza intenso, mareos, problemas visuales o confusin.  Siente un dolor abdominal intenso.  Tiene nuseas o vmitos permanentes.  Tiene convulsiones.  Tiene dificultad para mover cualquier parte del cuerpo.  Tiene adormecimiento en el cuerpo.  Tiene dificultad para hablar.  Tiene cualquier sangrado anormal.  Siente rigidez en el cuello.  Se desmaya. ASEGRESE DE QUE:   Comprende estas instrucciones.  Controlar su afeccin.  Recibir ayuda de inmediato si no mejora o si empeora. Esta informacin no tiene Theme park manager el consejo del mdico. Asegrese de hacerle al mdico cualquier pregunta que tenga. Document Released: 01/08/2005 Document Revised: 07/23/2015 Elsevier Interactive Patient Education  2017 [employer].  Parto vaginal, cuidados de puerperio (Postpartum Care After Vaginal Delivery) El perodo de tiempo que sigue inmediatamente al parto se conoce como puerperio. QU TIPO DE ATENCIN MDICA RECIBIR? Podra continuar recibiendo medicamentos y lquidos travs de una va intravenosa (IV) que se Scientific laboratory technician en una de sus  venas. Si se le realiz una incisin cerca de la vagina (episiotoma) o si ha tenido Airline pilot parto, podran indicarle que se coloque compresas fras sobre la episiotoma o Art therapist. Esto ayuda a Engineer, materials y la hinchazn. Es posible que le den una botella rociadora para que use cuando vaya al bao. Puede utilizarla hasta que se sienta cmoda limpindose de la manera habitual. Siga los pasos a continuacin para usar la botella rociadora: Antes de orinar, llene la botella rociadora con agua tibia. No use agua caliente. Despus de Geographical information systems officer, [place] an est sentada en el inodoro, use la botella rociadora para enjuagar el rea alrededor de la uretra y la abertura vaginal. Con esto podr limpiar cualquier rastro de orina y [place]. Puede hacer esto en lugar de secarse. Cuando comience a Barrister's clerk, podr usar la botella rociadora antes de secarse. Asegrese de secarse suavemente. Llene la botella rociadora con agua limpia cada vez que vaya al bao. Deber usar apsitos sanitarios. CMO PUEDO SENTIRME? Quizs no tenga necesidad de orinar durante varias horas despus del parto. Sentir algo de dolor y Associate Professor en el abdomen y la vagina. Si est amamantando, podra tener contracciones uterinas cada vez que lo haga. Estas podran prolongarse hasta varias semanas durante el puerperio. Las contracciones uterinas ayudan al tero a Hotel manager a su tamao habitual. Es normal tener un poco de hemorragia vaginal (loquios) despus del [place]. La cantidad y apariencia de los loquios a menudo es similar a las del perodo menstrual la primera semana  despus del parto. Disminuir gradualmente las siguientes semanas hasta convertirse en una descarga seca amarronada o Myrtleamarillenta. En la Lennar Corporationmayora de las mujeres, los loquios se detienen Guardian Life Insurancecompletamente entre 6 a 8semanas despus del Thorntonparto. Los sangrados vaginales pueden variar de mujer a Nurse, learning disabilitymujer. Los primeros [address]das despus del parto, podra padecer Manillacongestin  mamaria. Los pechos se sentirn pesados, llenos y molestos. Las mamas tambin podran latir y ponerse duras, muy tirantes, calientes y sensibles al tacto. Cuando esto [place]ocurra, podra notar Mirantleche que se escapa de los senos.El mdico puede recomendarle algunos mtodos para Emergency planning/management officeraliviar este malestar causado por la Frederickcongestin mamaria. La congestin mamaria debera desaparecer al cabo de The Mutual of Omahaunos das. Podra sentirse ms deprimida o preocupada que lo habitual debido a los cambios hormonales luego del Clark[place]parto. Estos sentimientos no deben durar ms de Hughes Supplyunos pocos das. Si no desaparecen al cabo de Time Warneralgunos das, hable con su mdico. QU CUIDADOS DEBO TENER? Infrmele a su mdico si siente dolor o malestar. Beba suficiente agua para mantener la orina clara o de color amarillo plido. Lvese bien las manos con agua y jabn durante al menos 20segundos despus de cambiar el apsito sanitario, usar el bao o antes de sostener o Corporate treasureralimentar al beb. Si no est amamantando, evite tocarse mucho los senos. Al hacerlo, podran producir ms WPS Resourcesleche. Si se siente dbil o mareada, o si siente que est a punto de [address]desmayarse, pida ayuda antes de realizar lo siguiente: Levantarse de la cama. Ducharse. Cambie los apsitos sanitarios con frecuencia. Observe si hay cambios en el flujo, como un aumento repentino en el volumen, cambios en el color o cogulos sanguneos de gran tamao. Si expulsa un cogulo sanguneo por la vagina, gurdelo para mostrrselo a su mdico. No tire la cadena sin que el mdico examine el cogulo antes. Asegrese de tener todas las vacunas al da. Esto la ayudar a Theme park managerestar protegida y a proteger al beb de determinadas enfermedades. Podra necesitar vacunas antes de dejar el hospital. Si lo desea, hable con el mdico acerca de los mtodos de planificacin familiar o control de la natalidad (mtodos anticonceptivos). CMO PUEDO ESTABLECER LAZOS CON MI BEB? Pasar tanto tiempo como le sea posible con el beb es  sumamente importante. Durante ese tiempo, usted y su beb pueden conocerse y Theme park managerdesarrollar lazos. Tener al beb con usted en la habitacin le dar tiempo de conocerlo. Esto tambin puede hacerla sentir ms cmoda para atender al beb. Amamantar tambin puede ayudarla a crear lazos con el beb. CMO PUEDO PLANIFICAR MI REGRESO A CASA CON EL BEB? Asegrese de tener instalada una butaca en el automvil. La butaca debe contar con la certificacin del fabricante para asegurarse de que est instalada en forma segura. Asegrese de que el beb quede bien asegurado en la butaca. Pregntele al mdico todo lo que necesite saber sobre los cuidados de su beb. Asegrese de poder comunicarse con el mdico en caso de que tenga preguntas luego de dejar el hospital. Esta informacin no tiene Theme park managercomo fin reemplazar el consejo del mdico. Asegrese de hacerle al mdico cualquier pregunta que tenga. Document Released: 01/26/2007 Document Revised: 07/23/2015 Document Reviewed: 03/05/2015 Elsevier Interactive Patient Education  2017 Elsevier Inc.Almacenamiento de la [place]leche materna (Storing Breast Milk) La leche materna es un lquido vital que contiene clulas que combaten infecciones (anticuerpos). La Bed Bath & Beyondleche materna que se sac con anterioridad (extrajo) debe almacenarse de un cierto modo para que mantenga su eficacia en lo que concierne a la proteccin del beb contra las infecciones. Las siguientes pautas son para Academic librarianalmacenar  la [place] para un beb sano nacido a trmino.  CUNTO TIEMPO SE PUEDE ALMACENAR LA LECHE MATERNA? La leche puede almacenarse hasta 4horas a temperatura ambiente o a una temperatura de 72F a 37F (15,6C a 19,4C). Sin embargo, se la puede guardar durante 6 a 8horas si las piezas del sacaleches y los recipientes estn bien higienizados. La leche puede almacenarse durante 3das en un refrigerador a menos de 75F (3,9C). Sin embargo, se la puede guardar durante 8das si las piezas del  sacaleches y los recipientes estn bien higienizados. La leche puede almacenarse durante 2semanas en un congelador que se encuentra dentro de Community education officer. La leche puede almacenarse durante 3 o en un congelador con una puerta aparte. La leche puede almacenarse durante 6 a en una cmara de congelacin a -71F (-20C). Posey Boyer de congelacin es un arcn congelador o un congelador independiente que no se abre con frecuencia y se mantiene a una temperatura inferior. CMO DEBO ALMACENAR LA LECHE MATERNA? La leche materna puede almacenarse de la siguiente forma: En un recipiente de vidrio. En un envase de plstico rgido. En Lucile Shutters de plstico diseada especialmente para Counselling psychologist. Muchas mujeres prefieren esta opcin porque ocupa menos espacio y se puede Press photographer al sacaleches. Almacene la leche en porciones de 2 a 4onzas (60 a ) para que sea ms fcil descongelarla. Adems, esto es til para no desechar la [employer] beb no toma. Deje un espacio de aproximadamente una pulgada en la parte superior de la bolsa o el bibern para permitir que la [place] se expanda a medida que se congela. [place] cada recipiente con la fecha y la hora que se sac la leche para usarla en el orden en que se extrajo. Si va a congelar la [place], gurdela en la parte posterior del congelador para evitar que los cambios de temperatura que se producen al abrir la puerta de la unidad la afecten. CMO DESCONGELAR LA LECHE MATERNA CONGELADA Se puede descongelar la leche materna de la siguiente forma: En un refrigerador. Debajo del chorro de agua tibia del grifo. En un recipiente con agua tibia que haya sido calentada en la estufa. No caliente la [employer] en la estufa o en el microondas ya que se destruirn algunas de sus propiedades para combatir infecciones. Se puede almacenar la Col[employer] Palmolive descongelada en un refrigerador durante 24horas como mximo, pero no se  debe volver a Camera operator. Si desea agregar WPS Resources recientemente extrada a la [place], debe agregar menos cantidad de [place] de la que ya est congelada. Enfre la WPS Resources recientemente extrada en el refrigerador durante antes de agregrsela a la leche que est en Personal assistant. Esta informacin no tiene Theme park manager el consejo del mdico. Asegrese de hacerle al mdico cualquier pregunta que tenga. Document Released: 03/19/2009 Document Revised: 04/05/2013 Elsevier Interactive Patient Education  2017 [employer]itor.   [employer] del mtodo anticonceptivo (Contraception Choices) La anticoncepcin (control de la natalidad) es el uso de cualquier mtodo o dispositivo para Location manager. A continuacin se indican algunos de esos mtodos. MTODOS HORMONALES  El Implante contraconceptivo consiste en un tubo plstico delgado que contiene la hormona progesterona. No contiene estrgenos. El mdico inserta el tubo en la parte interna del brazo. El tubo puede Geneticist, molecular durante 3 aos. Despus de los 3 aos debe retirarse. El implante impide que los ovarios liberen vulos (ovulacin), espesa el moco cervical, lo que evita que los espermatozoides ingresen al tero  y hace ms delgada la membrana que cubre el interior del Star.  Inyecciones de progesterona sola: las Insurance underwriter cada 3 meses para Location manager. La progesterona sinttica impide que los ovarios liberen vulos. Tambin hacen que el moco cervical se espese y modifique el tejido de recubrimiento interno del tero. Esto hace ms difcil que los espermatozoides sobrevivan en el tero.  Las pldoras anticonceptivas contienen estrgenos y Education officer, museum. Su funcin es [employer] ovarios liberen vulos (ovulacin). Las hormonas de los anticonceptivos orales hacen que el moco cervical se haga ms espeso, lo que evita que el esperma ingrese al tero. Las pldoras anticonceptivas son recetadas por el  mdico.Tambin se utilizan para tratar los perodos menstruales abundantes.  Minipldora: este tipo de pldora anticonceptiva contiene slo hormona progesterona. Deben tomarse todos los [address] del mes y debe recetarlas el mdico.  El parche de control de natalidad: contiene hormonas similares a las que contienen las pldoras anticonceptivas. Deben cambiarse una vez por semana y se utilizan bajo prescripcin mdica.  Anillo vaginal: contiene hormonas similares a las que contienen las pldoras anticonceptivas. Se deja colocado durante tres semanas, se lo retira durante 1 semana y luego se coloca uno nuevo. La paciente debe sentirse cmoda al insertar y retirar el anillo de la vagina.Es necesaria la prescripcin mdica.  Anticonceptivos de emergencia: son mtodos para evitar un embarazo despus de Neomia Dear relacin sexual sin proteccin. Esta pldora puede tomarse inmediatamente despus de Child psychotherapist sexuales o hasta [address] [place] de haber tenido sexo sin proteccin. Es ms efectiva si se toma poco tiempo despus de la relacin sexual. Los anticonceptivos de emergencia estn disponibles sin prescripcin mdica. Consltelo con su farmacutico. No use los anticonceptivos de emergencia como nico mtodo anticonceptivo. MTODOS DE Lenis Noon  Condn masculino: es una vaina delgada (ltex o goma) que se coloca cubriendo al pene durante el acto sexual. Deri Fuelling con espermicida para aumentar la efectividad.  Condn femenino. Es una funda delicada y blanda que se adapta holgadamente a la vagina antes de las Clinical research associate.  Diafragma: es una barrera de ltex redonda y suave que debe ser recomendado por un profesional. Se inserta en la vagina, junto con un gel espermicida. Debe insertarse antes de Management consultant. Debe dejar el diafragma colocado en la vagina durante 6 a 8 horas despus de la relacin sexual.  Capuchn cervical: es una barrera de ltex o taza plstica redonda y [place] que cubre el  cuello del tero y debe ser colocada por un mdico. Puede dejarlo colocado en la vagina hasta 48 horas despus de las Clinical research associate.  Esponja: es una pieza blanda y circular de espuma de poliuretano. Contiene un espermicida. Se inserta en la vagina despus de mojarla y antes de las [employer].  Espermicidas: son sustancias qumicas que matan o bloquean al esperma y no lo dejan ingresar al cuello del tero y al tero. Vienen en forma de cremas, geles, supositorios, espuma o comprimidos. No es necesario tener Emergency planning/management officer. Se insertan en la vagina con un aplicador antes de Management consultant. El proceso debe repetirse cada vez que tiene relaciones sexuales. ANTICONCEPTIVOS INTRAUTERINOS  Dispositivo intrauterino (DIU) es un dispositivo en forma de T que se coloca en el tero durante el perodo menstrual, para Location manager. Hay dos tipos:  DIU de cobre: este tipo de DIU est recubierto con un alambre de cobre y se inserta dentro del tero. El cobre hace que el tero y las trompas de Falopio produzcan un liquido  que destruye los espermatozoides. Puede permanecer colocado durante 10 aos.  DIU con hormona: este tipo de DIU contiene la hormona progestina (progesterona sinttica). La hormona espesa el moco cervical y evita que los espermatozoides ingresen al tero y tambin afina la membrana que cubre el tero para evitar la implantacin del vulo fertilizado. La hormona debilita o destruye los espermatozoides que ingresan al tero. Puede Geneticist, molecularpermanecer en el lugar durante 3-5 aos, segn el tipo de DIU que se Chenango Bridgeutilice. MTODOS ANTICONCEPTIVOS PERMANENTES  Ligadura de trompas en la mujer: se realiza sellando, atando u obstruyendo quirrgicamente las trompas de Falopio lo que impide que el vulo descienda hacia el tero.  Esterilizacin histeroscpica: Implica la colocacin de un pequeo espiral o la insercin en cada trompa de Falopio. El mdico utiliza una tcnica llamada  histeroscopa para Primary school teacherrealizar este procedimiento. El dispositivo produce la formacin de tejido Designer, television/film setcicatrizal. Esto da como resultado una obstruccin permanente de las trompas de Falopio, de modo que la esperma no pueda fertilizar el vulo. Demora alrededor de 3 meses despus del procedimiento hasta que el conducto se obstruye. Tendr que usar otro mtodo anticonceptivo durante al menos 3 meses.  Esterilizacin masculina: se realiza ligando los conductos por los que pasan los espermatozoides (vasectoma).Esto impide que el esperma ingrese a la vagina durante el acto sexual. Luego del procedimiento, el hombre puede eyacular lquido (semen). MTODOS DE PLANIFICACIN NATURAL  Planificacin familiar natural: consiste en no Management consultanttener relaciones sexuales o usar un mtodo de barrera (condn, Villa Ricadiafragma, capuchn cervical) en los IKON Office Solutionsdas que la mujer podra quedar Avantembarazada.  Mtodo de calendario: consiste en el seguimiento de la duracin de cada ciclo menstrual y la identificacin de los perodos frtiles.  Mtodo de ovulacin: Paramedicconsiste en evitar las relaciones sexuales durante la ovulacin.  Mtodo sintotrmico: Advertising copywriterconsiste en evitar las relaciones sexuales en la poca en la que se est ovulando, utilizando un termmetro y tendiendo en cuenta los sntomas de la ovulacin.  Mtodo postovulacin: Youth workerconsiste en planificar las relaciones sexuales para despus de haber ovulado. Independientemente del tipo o mtodo anticonceptivo que usted elija, es importante que use condones para protegerse contra las infecciones de transmisin sexual (ETS). Hable con su mdico con respecto a qu mtodo anticonceptivo es el ms apropiado para usted. Esta informacin no tiene Theme park managercomo fin reemplazar el consejo del mdico. Asegrese de hacerle al mdico cualquier pregunta que tenga. Document Released: 03/31/2005 Document Revised: 12/01/2012 Document Reviewed: 09/23/2012 Elsevier Interactive Patient Education  2017 ArvinMeritor[employer]vier Inc.

## 2016-03-28 ENCOUNTER — Inpatient Hospital Stay (HOSPITAL_COMMUNITY): Payer: Self-pay

## 2016-05-26 ENCOUNTER — Ambulatory Visit: Payer: Medicaid Other | Admitting: Family Medicine

## 2016-06-09 ENCOUNTER — Ambulatory Visit (INDEPENDENT_AMBULATORY_CARE_PROVIDER_SITE_OTHER): Payer: Medicaid Other | Admitting: Family Medicine

## 2016-06-09 ENCOUNTER — Encounter: Payer: Self-pay | Admitting: Family Medicine

## 2016-06-09 DIAGNOSIS — R030 Elevated blood-pressure reading, without diagnosis of hypertension: Secondary | ICD-10-CM

## 2016-06-09 DIAGNOSIS — Z3009 Encounter for other general counseling and advice on contraception: Secondary | ICD-10-CM

## 2016-06-09 NOTE — Progress Notes (Signed)
Date of Visit: 06/09/2016   HPI:  Postpartum visit: patient is 11 weeks postpartum following a vaginal delivery (VBAC). Pregnancy complicated by preeclampsia. Delivery complicated by 2nd degree vaginal laceration, repaired. Spanish interpreter utilized during today's visit.   -Breastfeeding: none, just bottle feeding, no problems with breasts -Contraception: was hoping to have nexplanon placed today but has no payer source, willing to consider mirena & do scholarship. Does not want pills. -Bleeding: had a regular period since delivery -Bonding with infant: yes -Sexual activity: none yet. Reports laceration area is improved, does not need it examined today. -Mood: doing well, no SI/HI, not feeling depressed   ROS: See HPI.  PMFSH: history of GERD  PHYSICAL EXAM: BP 138/90   Pulse 84   Temp 97.8 F (36.6 C) (Oral)   Wt 195 lb (88.5 kg)   BMI 35.67 kg/m  Gen: no acute distress, pleasant cooperative Heent: normocephalic, atraumatic  Lungs: normal effort Neuro: grossly nonfocal, speech normal  ASSESSMENT/PLAN:  Contraception management Patient desiring nexplanon, however not able to afford it out of pocket & unfortunately no scholarship exists Discussed option of mirena IUD. Given mirena scholarship information today for patient to fill out & return. Once we know if it is approved, we will contact her to schedule insertion.  Elevated blood pressure reading Blood pressure up slightly today at 138/90. Plan to recheck at mirena insertion appointment if she is approved for mirena scholarship. Otherwise patient to schedule nurse visit to recheck blood pressure.   FOLLOW UP: Follow up pending mirena scholarship  GrenadaBrittany J. Pollie MeyerMcIntyre, MD [facility]

## 2016-06-13 DIAGNOSIS — Z309 Encounter for contraceptive management, unspecified: Secondary | ICD-10-CM | POA: Insufficient documentation

## 2016-06-13 DIAGNOSIS — R030 Elevated blood-pressure reading, without diagnosis of hypertension: Secondary | ICD-10-CM | POA: Insufficient documentation

## 2016-06-13 NOTE — Assessment & Plan Note (Signed)
Patient desiring nexplanon, however not able to afford it out of pocket & unfortunately no scholarship exists Discussed option of mirena IUD. Given mirena scholarship information today for patient to fill out & return. Once we know if it is approved, we will contact her to schedule insertion.

## 2016-06-13 NOTE — Assessment & Plan Note (Signed)
Blood pressure up slightly today at 138/90. Plan to recheck at mirena insertion appointment if she is approved for mirena scholarship. Otherwise patient to schedule nurse visit to recheck blood pressure.

## 2016-07-15 ENCOUNTER — Ambulatory Visit: Payer: Self-pay | Admitting: Family Medicine

## 2016-08-04 ENCOUNTER — Ambulatory Visit: Payer: Self-pay | Admitting: Family Medicine

## 2016-08-04 NOTE — Progress Notes (Deleted)
   Subjective:   Tara [name] is a 36 y.o. female with a history of *** here for postpartum visit  ***  Postpartum visit: patient is *** weeks postpartum following a *** delivery. -Breastfeeding: *** -Contraception: *** -Bleeding: *** -Bonding with infant: *** -Sexual activity: *** -Mood: ***  Review of Systems:  Per HPI.   Social History: *** smoker  Objective:  There were no vitals taken for this visit.  Gen:  36 y.o. female in NAD *** HEENT: NCAT, MMM, EOMI, PERRL, anicteric sclerae CV: RRR, no MRG, no JVD Resp: Non-labored, CTAB, no wheezes noted Abd: Soft, NTND, BS present, no guarding or organomegaly Ext: WWP, no edema MSK: Full ROM, strength intact Neuro: Alert and oriented, speech normal       Chemistry      Component Value Date/Time   NA 135 03/21/2016 2004   K 3.8 03/21/2016 2004   CL 105 03/21/2016 2004   CO2 22 03/21/2016 2004   BUN 9 03/21/2016 2004   CREATININE 0.45 03/21/2016 2004   CREATININE 0.51 03/12/2016 1112      Component Value Date/Time   CALCIUM 9.0 03/21/2016 2004   ALKPHOS 148 (H) 03/21/2016 2004   AST 37 03/21/2016 2004   ALT 38 03/21/2016 2004   BILITOT 0.3 03/21/2016 2004      Lab Results  Component Value Date   WBC 10.7 (H) 03/25/2016   HGB 12.1 03/25/2016   HCT 36.5 03/25/2016   MCV 86.9 03/25/2016   PLT 298 03/25/2016   No results found for: TSH No results found for: HGBA1C Assessment & Plan:     Tara [name] is a 36 y.o. female here for ***  No problem-specific Assessment & Plan notes found for this encounter.     Erasmo Downer, MD MPH PGY-3,  [facility] 08/04/2016  10:59 AM

## 2016-08-11 ENCOUNTER — Ambulatory Visit: Payer: Self-pay | Admitting: Family Medicine

## 2016-08-11 NOTE — Progress Notes (Deleted)
IUD Insertion Procedure Note  Pre-operative Diagnosis: *** Patient is [phone] s/p NSVD *** Desires contraception with LARC Sexually active with 1 female partner (husband) *** h/o STD: ***  Post-operative Diagnosis: normal  Indications: contraception  Procedure Details  Urine pregnancy test was done *** and result was ***.  The risks (including infection, bleeding, pain, and uterine perforation) and benefits of the procedure were explained to the patient and Written informed consent was obtained.    Cervix cleansed with Betadine. Uterus sounded to {0-10:33138} cm. IUD inserted without difficulty. String visible and trimmed. Patient tolerated procedure well.  IUD Information: {IUD tracking :14606}.  Condition: Stable  Complications: None  Plan:  The patient was advised to call for any fever or for prolonged or severe pain or bleeding. She was advised to use OTC acetaminophen and OTC analgesics as needed for mild to moderate pain.    Erasmo Downer, MD, MPH PGY-3,  [facility] 08/11/2016 9:38 AM

## 2016-08-21 ENCOUNTER — Telehealth: Payer: Self-pay | Admitting: *Deleted

## 2016-08-21 ENCOUNTER — Ambulatory Visit: Payer: Self-pay | Admitting: Family Medicine

## 2016-08-21 NOTE — Telephone Encounter (Signed)
Contacted pt to inform her that we will have to cancel her appointment for today due to not having the device yet for the procedure, told her as soon as we receive it we would contact her and set up this appointment.  Used [employer]pre[employer] Patricia ID# [phone] [id number]. Lamonte SakaiZimmerman Rumple, April D, [place]

## 2016-08-21 NOTE — Telephone Encounter (Signed)
Contacted [employer].  Pt has been approved but [employer] (representative) could not tell why the IUD was not process completely, therefore it was never shipped.  She will process again and if everything goes correctly we should receive the IUD by 08/27/16. Fleeger, Maryjo RochesterJessica Dawn, CMA

## 2016-09-03 NOTE — Telephone Encounter (Signed)
Received IUD from [employer].  Called pt via [employer] [facility](Melissa ID# [phone]) and appt made for 09/15/16. Vinay Ertl, Maryjo RochesterJessica Dawn, CMA

## 2016-09-15 ENCOUNTER — Ambulatory Visit (INDEPENDENT_AMBULATORY_CARE_PROVIDER_SITE_OTHER): Payer: Self-pay | Admitting: Family Medicine

## 2016-09-15 ENCOUNTER — Encounter: Payer: Self-pay | Admitting: Family Medicine

## 2016-09-15 VITALS — BP 150/88 | HR 82 | Temp 98.1°F | Ht 62.0 in | Wt 202.8 lb

## 2016-09-15 DIAGNOSIS — Z30430 Encounter for insertion of intrauterine contraceptive device: Secondary | ICD-10-CM

## 2016-09-15 DIAGNOSIS — Z3009 Encounter for other general counseling and advice on contraception: Secondary | ICD-10-CM

## 2016-09-15 LAB — POCT URINE PREGNANCY: Preg Test, Ur: NEGATIVE

## 2016-09-15 NOTE — Patient Instructions (Signed)
Colocación de un dispositivo intrauterino - Cuidados posteriores  (Intrauterine Device Insertion, Care After)  Siga estas instrucciones durante las próximas semanas. Estas indicaciones le proporcionan información general acerca de cómo deberá cuidarse después del procedimiento. El médico también podrá darle instrucciones más específicas. El tratamiento ha sido planificado según las prácticas médicas actuales, pero en algunos casos pueden ocurrir problemas. Comuníquese con el médico si tiene algún problema o tiene dudas después del procedimiento.  QUÉ ESPERAR DESPUÉS DEL PROCEDIMIENTO  La inserción del DIU puede causar molestias, como cólicos. que deberían mejorar una vez que el DIU esté en su lugar. Podrá tener sangrado después del procedimiento. Esto es normal. Varía desde un sangrado ligero durante un par de días hasta un sangrado similar al menstrual. Cuando el DIU esté en su lugar, se extenderá un hilo de 1 a 2 pulgadas (2,5 a 5 cm) por el cuello del útero en la vagina. El hilo no debería molestarle a usted ni a su pareja. De lo contrario, consulte con su médico.  INSTRUCCIONES PARA EL CUIDADO EN EL HOGAR  · Controle su DIU para asegurarse de que esté en su lugar, antes de reanudar la actividad sexual. Tiene que sentir los hilos. Si no los siente, algo puede estar mal. El DIU puede haberse salido del útero o éste puede haber sido atravesado (perforado) durante la colocación. Además, si los hilos son más largos, puede significar que el DIU se está saliendo del útero. Si ocurre alguno de estos problemas, no estará protegida y podrá quedar embarazada.  · Puede volver a tener relaciones sexuales si no tiene problemas con el DIU. El DIU de cobre se considera efectivo y funciona de inmediato, si se inserta dentro de los 7 días del inicio del período. Será necesario que utilice un método anticonceptivo adicional durante 7 días, si el DIU se inserta en algún otro momento del ciclo.   · Controle que el DIU sigue en su lugar sintiendo los hilos después de cada período menstrual.  · Es posible que necesite tomar analgésicos, como acetaminofeno o ibuprofeno. Tome todos los medicamentos como le indicó el médico.    SOLICITE ATENCIÓN MÉDICA SI:  · Tiene un sangrado más abundante o dura más de un ciclo menstrual normal.  · Tiene fiebre.  · Siente cólicos o dolor abdominal que no se alivian con medicamentos.  · Siente dolor abdominal que no parece estar relacionado con el área en que sentía los cólicos y el dolor anteriormente.  · Se siente mareada, inusualmente débil o se desmaya.  · Tiene flujo vaginal u olores anormales.  · Siente dolor durante las relaciones sexuales.  · No puede sentir los hilos del DIU o los siente más largos.  · Siente que el DIU está en la abertura del cuello del útero, en la vagina.  · Piensa que está embarazada o no tiene su período menstrual.  · El hilo del DIU está lastimando a su pareja sexual.    ASEGÚRESE DE QUE:  · Comprende estas instrucciones.  · Controlará su afección.  · Recibirá ayuda de inmediato si no mejora o si empeora.    Esta información no tiene como fin reemplazar el consejo del médico. Asegúrese de hacerle al médico cualquier pregunta que tenga.  Document Released: 12/24/2011 Document Revised: 01/19/2013 Document Reviewed: 09/19/2012  Elsevier Interactive Patient Education © 2017 Elsevier Inc.

## 2016-09-15 NOTE — Progress Notes (Signed)
IUD Insertion Procedure Note  Pre-operative Diagnosis: desire for contraception, 6 months post-partum  Post-operative Diagnosis: normal  Indications: contraception  Procedure Details  LMP ~08/25/16.  Urine pregnancy test was done today and result was negative.  The risks (including infection, bleeding, pain, and uterine perforation) and benefits of the procedure were explained to the patient and Written informed consent was obtained.    Cervix cleansed with Betadine. Uterus sounded to 9 cm. IUD inserted without difficulty. String visible and trimmed. Patient tolerated procedure well.  Consent was obtained using [employer] Cynthia # [phone].  IUD Information: Mirena, Lot # [id number], Expiration date 02/2019.  Condition: Stable  Complications: None  Plan:  The patient was advised to call for any fever or for prolonged or severe pain or bleeding. She was advised to use OTC analgesics as needed for mild to moderate pain.    Erasmo DownerBacigalupo, Brystol Wasilewski M, MD, MPH PGY-3,   Family Medicine 09/15/2016 2:06 PM

## 2016-09-16 MED ORDER — LEVONORGESTREL 20 MCG/24HR IU IUD
INTRAUTERINE_SYSTEM | Freq: Once | INTRAUTERINE | Status: AC
  Administered 2016-09-15: 1 via INTRAUTERINE

## 2016-09-16 NOTE — Addendum Note (Signed)
Addended by: Jone BasemanFLEEGER, JESSICA D on: 09/16/2016 08:42 AM   Modules accepted: Orders

## 2016-12-04 ENCOUNTER — Ambulatory Visit: Payer: Self-pay

## 2016-12-08 ENCOUNTER — Ambulatory Visit (INDEPENDENT_AMBULATORY_CARE_PROVIDER_SITE_OTHER): Payer: Self-pay | Admitting: Family Medicine

## 2016-12-08 DIAGNOSIS — Z30432 Encounter for removal of intrauterine contraceptive device: Secondary | ICD-10-CM

## 2016-12-08 NOTE — Progress Notes (Signed)
    Subjective:  Tara [name] is a 36 y.o. female who presents to the [facility] today with a chief complaint of pelvic pain, concerns regarding IUD.   HPI:  Pelvic pain. 6 weeks of pelvic pain. 2 mo s/p IUD placement. Associated w/ . Pt is experiencing longer menstruation. It is not heavier. Pt endorses bleeding for two weeks. Pt is not engaged in intercourse at this time. Abdominal pain in lower quadrant/suprapubic pain. Never had this before. Pain Is constant. Describes it as 6/10 pain. Tempra (aka tylenol) for pain. It helps a little bit. Pain is not getting worse. Prior to IUD, periods were regular, w/ 3 day menstrual bleeding.   Endorses HA, abdominal pain in lower quadrant/suprapubic pain, dizziness,  Denies diarrhea, constipation, nausea, sob, cp, f/c, vaginal discharge  Does not want any other form of birth control at this moment.   ROS: Per HPI   Objective:  Physical Exam: BP (!) 142/86 (BP Location: Left Arm, Patient Position: Sitting, Cuff Size: Normal)   Pulse 86   Temp 98.3 F (36.8 C) (Oral)   Ht 5\' 2"  (1.575 m)   Wt 202 lb (91.6 kg)   SpO2 97%   BMI 36.95 kg/m   Gen: NAD, resting comfortably GYN: difficult cervical exam, redundant vaginal wall, able to briefly visualize IUD and tails, but unable to successfully remove No results found for this or any previous visit (from the past 72 hour(s)).   Assessment/Plan:  Encounter for IUD removal Pt endorsing persistent pelvic pain and vaginal bleeding. Wants to have IUD removed. Refuses other forms of birth control at the moment. Will plan to remove IUD today, precepted by Dr. Leveda Anna. Consent obtained. Removed without any complications.    Thomes Dinning, MD, MS FAMILY MEDICINE RESIDENT - PGY1 12/08/2016 3:44 PM

## 2016-12-08 NOTE — Progress Notes (Signed)
Attempt at Procedure:  PRE-OP DIAGNOSIS: Pelvic pain secondary to IUD placement  POST-OP DIAGNOSIS: Same  PROCEDURE: IUD removal Performing Physician: Dr. Fanny Bien  Supervising Physician (if applicable): Dr. Doralee Albino   PROCEDURE:  Difficult cervical exam due to redundant vaginal wall. Was able to visualize IUD and its tails, but was unable to successfully remove with IUD with ringed forcepts despite multiple attempts.   Followup: Patient was rescheduled to come to dedicated GYN clinic for removal of IUD.

## 2016-12-08 NOTE — Patient Instructions (Signed)
It was a pleasure to see you today! Thank you for choosing [facility] for your primary care. Tara [name] was seen for IUD removal. Attempt was unsuccessful. She was rescheduled for GYN procedure clinic.   Best,  Thomes Dinning, MD, MS FAMILY MEDICINE RESIDENT - PGY1 12/08/2016 5:10 PM

## 2016-12-08 NOTE — Assessment & Plan Note (Addendum)
Pt endorsing persistent pelvic pain and vaginal bleeding. Wants to have IUD removed. Refuses other forms of birth control at the moment. Attempted to remove IUD today. Consent obtained. precepted by Dr. Leveda Anna.  Unable to remove, despite multiple attempts due to redundant vaginal wall. Pt is rescheduled for next GYN clinic for removal.

## 2016-12-18 ENCOUNTER — Ambulatory Visit: Payer: Self-pay

## 2017-01-01 ENCOUNTER — Ambulatory Visit (INDEPENDENT_AMBULATORY_CARE_PROVIDER_SITE_OTHER): Payer: Self-pay | Admitting: Family Medicine

## 2017-01-01 VITALS — BP 128/88 | HR 78 | Temp 97.7°F | Ht 62.0 in

## 2017-01-01 DIAGNOSIS — Z975 Presence of (intrauterine) contraceptive device: Secondary | ICD-10-CM

## 2017-01-05 NOTE — Progress Notes (Signed)
PROCEDURE NOTE: IUD removal Patient given informed consent for IUD removal. She is aware this will stop the birth control method provided by the IUD immediately. Informed consent given and signed copy in the chart.Appropriate time out taken. Patient placed in the lithotomy position and the cervix brought into view using speculum. The IUD strings were identified coming from the cervical os. These strings were grasped with ring forceps, and the IUD withdrawn gently from the uterus. There were no complications and no blood loss. Patient tolerated the procedure well.

## 2017-11-12 ENCOUNTER — Inpatient Hospital Stay (HOSPITAL_COMMUNITY)
Admission: AD | Admit: 2017-11-12 | Discharge: 2017-11-12 | Disposition: A | Discharge status: Home / Self Care | Payer: Self-pay | Source: Ambulatory Visit | Attending: Family Medicine | Admitting: Family Medicine

## 2017-11-12 ENCOUNTER — Other Ambulatory Visit: Payer: Self-pay

## 2017-11-12 ENCOUNTER — Encounter (HOSPITAL_COMMUNITY): Payer: Self-pay | Admitting: *Deleted

## 2017-11-12 DIAGNOSIS — R109 Unspecified abdominal pain: Secondary | ICD-10-CM | POA: Insufficient documentation

## 2017-11-12 DIAGNOSIS — N912 Amenorrhea, unspecified: Secondary | ICD-10-CM | POA: Insufficient documentation

## 2017-11-12 DIAGNOSIS — Z3202 Encounter for pregnancy test, result negative: Secondary | ICD-10-CM | POA: Insufficient documentation

## 2017-11-12 LAB — URINALYSIS, ROUTINE W REFLEX MICROSCOPIC
Bacteria, UA: NONE SEEN
Bilirubin Urine: NEGATIVE
GLUCOSE, UA: NEGATIVE mg/dL
HGB URINE DIPSTICK: NEGATIVE
Ketones, ur: NEGATIVE mg/dL
Leukocytes, UA: NEGATIVE
NITRITE: NEGATIVE
PH: 6 (ref 5.0–8.0)
PROTEIN: 100 mg/dL — AB
Specific Gravity, Urine: 1.021 (ref 1.005–1.030)

## 2017-11-12 LAB — POCT PREGNANCY, URINE: Preg Test, Ur: NEGATIVE

## 2017-11-12 NOTE — Discharge Instructions (Signed)
Hacer ejercicio para bajar de peso  (Exercising to Lose Weight)  Hacer ejercicio puede ayudarlo a bajar de peso. Para bajar de peso mediante el ejercicio, este debe ser de intensidad vigorosa. Puede saber que está haciendo ejercicio de intensidad vigorosa si respira con mucha dificultad y rapidez, y no puede mantener una conversación.  El ejercicio de intensidad moderada ayuda a mantener el peso actual. Puede saber que está haciendo ejercicio de intensidad moderada si tiene una frecuencia cardíaca más elevada y una respiración más rápida, pero aún puede mantener una conversación.  ¿CON QUÉ FRECUENCIA DEBO HACER EJERCICIO?  Elija una actividad que disfrute y establezca objetivos realistas. El médico puede ayudarlo a elaborar un plan de actividades que funcione para usted. Haga ejercicio regularmente como se lo haya indicado el médico. Esta puede incluir:  · Realizar entrenamiento de resistencia dos veces por semana, como:  ? Flexiones de brazos.  ? Abdominales.  ? Levantamiento de pesas.  ? Ejercicios con bandas elásticas.  · Realizar una intensidad determinada de ejercicio durante una cantidad determinada de tiempo. Elija entre estas opciones:  ? 150 minutos de ejercicio de intensidad moderada cada semana.  ? 75 minutos de ejercicio de intensidad vigorosa cada semana.  ? Una mezcla de ejercicio de intensidad moderada y vigorosa cada semana.  Los niños, las mujeres embarazadas, las personas que no están en forma, las personas con sobrepeso y los adultos mayores tal vez tengan que consultar a un médico para que les dé recomendaciones individuales. Si tiene alguna enfermedad, asegúrese de consultar al médico antes de comenzar un programa de ejercicios nuevo.  ¿CUÁLES SON ALGUNAS ACTIVIDADES QUE PUEDEN AYUDARME A BAJAR DE PESO?  · Caminar a un ritmo de al menos 4,5 millas (7 kilómetros) por hora.  · Trotar o correr a un ritmo de 5 millas (8 kilómetros) por hora.   · Andar en bicicleta a un ritmo de al menos 10 millas (16 kilómetros) por hora.  · Practicar natación.  · Practicar patinaje sobre ruedas normales o en línea.  · Hacer esquí de fondo.  · Hacer deportes competitivos vigorosos, como fútbol americano, básquet y fútbol.  · Saltar la soga.  · Tomar clases de baile aeróbico.  ¿CÓMO PUEDO SER MÁS ACTIVO EN MIS ACTIVIDADES DIARIAS?  · Utilice las escaleras en lugar del ascensor.  · Dé una caminata durante su hora de almuerzo.  · Si conduce, estacione el automóvil más lejos del trabajo o de la escuela.  · Si usa transporte público, bájese una parada antes y camine el resto del camino.  · Póngase de pie y camine cada vez que haga llamadas telefónicas.  · Levántese, estírese y camine cada 30 minutos a lo largo del día.  ¿QUÉ PAUTAS DEBO SEGUIR MIENTRAS HAGO EJERCICIO?  · No haga ejercicio en exceso que pudiera hacer que se lastime, se sienta mareado o tenga dificultad para respirar.  · Consulte al médico antes de comenzar un programa de ejercicios nuevo.  · Use ropa cómoda y calzado con buen soporte.  · Beba gran cantidad de agua mientras hace ejercicios para evitar la deshidratación o los golpes de calor. Durante la actividad física se pierde agua corporal que se debe reponer.  · Haga ejercicio hasta que se acelere su respiración y sus latidos cardíacos.  Esta información no tiene como fin reemplazar el consejo del médico. Asegúrese de hacerle al médico cualquier pregunta que tenga.  Document Released: 07/05/2010 Document Revised: 04/21/2014 Document Reviewed: 09/01/2013  Elsevier Interactive Patient Education © 2018 Elsevier Inc.

## 2017-11-12 NOTE — MAU Note (Cosign Needed Addendum)
Faculty Practice OB/GYN Attending MAU Note  Chief Complaint: Abdominal Pain    None     SUBJECTIVE Tara [name] is a 37 y.o. [id number]  who presents with ongoing  side pain-which she thinks is MSK in origin, and diarrhea (loose stools) that began yesterday and has resolved; Pt also has concerns that a condom was left inside of her after vaginal intercourse.  Pt reports that she just wants to make sure that the condom is not still inside of her. Pt also reports that she has not menstruated in 3 months.     Past Medical History:  Diagnosis Date  . Hypertension    no meds   OB History  Gravida Para Term Preterm AB Living  4 3 3  0 1 3  SAB TAB Ectopic Multiple Live Births  0 1 0 0 3    # Outcome Date GA Lbr Len/2nd Weight Sex Delivery Anes PTL Lv  4 Term 03/25/16 [redacted]w[redacted]d 06:01 / 00:09 3.589 kg (7 lb 14.6 oz) M VBAC EPI  LIV  3 Term 01/31/15 [redacted]w[redacted]d 12:10 / 00:45 3.675 kg (8 lb 1.6 oz) M VBAC EPI  LIV     Birth Comments: IOL at 39w for chronic hypertension  2 TAB 2015 [redacted]w[redacted]d            Birth Comments: medical TAB  1 Term 2005 [redacted]w[redacted]d  3.629 kg (8 lb) F CS-Unspec   LIV     Birth Comments: cesarean in [place] for "elevated BP" at around 37w, did not have trial of labor, went straight for cesarean, baby around 8lb   Past Surgical History:  Procedure Laterality Date  . CESAREAN SECTION    . THERAPEUTIC ABORTION     Social History   Socioeconomic History  . Marital status: Single    Spouse name: Not on file  . Number of children: Not on file  . Years of education: Not on file  . Highest education level: Not on file  Occupational History  . Not on file  Social Needs  . Financial resource strain: Not on file  . Food insecurity:    Worry: Not on file    Inability: Not on file  . Transportation needs:    Medical: Not on file    Non-medical: Not on file  Tobacco Use  . Smoking status: Never Smoker  . Smokeless tobacco: Never Used  Substance and Sexual Activity  . Alcohol  use: No  . Drug use: No  . Sexual activity: Yes  Lifestyle  . Physical activity:    Days per week: Not on file    Minutes per session: Not on file  . Stress: Not on file  Relationships  . Social connections:    Talks on phone: Not on file    Gets together: Not on file    Attends religious service: Not on file    Active member of club or organization: Not on file    Attends meetings of clubs or organizations: Not on file    Relationship status: Not on file  . Intimate partner violence:    Fear of current or ex partner: Not on file    Emotionally abused: Not on file    Physically abused: Not on file    Forced sexual activity: Not on file  Other Topics Concern  . Not on file  Social History Narrative  . Not on file   No current facility-administered medications on file prior to encounter.    Current Outpatient  Medications on File Prior to Encounter  Medication Sig Dispense Refill  . acetaminophen (TYLENOL) 325 MG tablet Take 650 mg by mouth every 6 (six) hours as needed for moderate pain or headache.    . ibuprofen (ADVIL,MOTRIN) 600 MG tablet Take 1 tablet (600 mg total) by mouth every 6 (six) hours. 30 tablet 0  . Prenatal Vit-Fe Fumarate-FA (PRENATAL MULTIVITAMIN) TABS tablet Take 1 tablet by mouth daily at 12 noon.     No Known Allergies  ROS: Pertinent items in HPI  OBJECTIVE BP 131/78 (BP Location: Left Arm)   Pulse 79   Temp 98.1 F (36.7 C)   Resp 18   Ht 5\' 3"  (1.6 m)   Wt 93 kg (205 lb)   LMP 08/16/2017 (Approximate)   BMI 36.31 kg/m    CONSTITUTIONAL: Well-developed, well-nourished female in no acute distress. Marland Kitchen.  NECK: Normal range of motion SKIN: Skin is warm and dry. No rash noted. Not diaphoretic. No erythema. No pallor. NEUROLGIC: Alert and oriented  RESPIRATORY: Effort and breath sounds normal, no problems with respiration noted. ABDOMEN: No  rebound or guarding.  PELVIC: Normal appearing external genitalia; normal appearing vaginal mucosa and  cervix.  No discharge,   no other palpable masses, no uterine or adnexal tenderness; no condom or other foreign bodies visualized. . MUSCULOSKELETAL: Normal range of motion. No tenderness.  No edema.    LAB RESULTS Results for orders placed or performed during the hospital encounter of 11/12/17 (from the past 48 hour(s))  Urinalysis, Routine w reflex microscopic     Status: Abnormal   Collection Time: 11/12/17 10:39 AM  Result Value Ref Range   Color, Urine YELLOW YELLOW   APPearance HAZY (A) CLEAR   Specific Gravity, Urine 1.021 1.005 - 1.030   pH 6.0 5.0 - 8.0   Glucose, UA NEGATIVE NEGATIVE mg/dL   Hgb urine dipstick NEGATIVE NEGATIVE   Bilirubin Urine NEGATIVE NEGATIVE   Ketones, ur NEGATIVE NEGATIVE mg/dL   Protein, ur 409100 (A) NEGATIVE mg/dL   Nitrite NEGATIVE NEGATIVE   Leukocytes, UA NEGATIVE NEGATIVE   RBC / HPF 0-5 0 - 5 RBC/hpf   WBC, UA 0-5 0 - 5 WBC/hpf   Bacteria, UA NONE SEEN NONE SEEN   Squamous Epithelial / LPF 0-5 0 - 5   Mucus PRESENT     Comment: Performed at [facility], [address] [place], [place] [address]  Pregnancy, urine POC     Status: None   Collection Time: 11/12/17 10:51 AM  Result Value Ref Range   Preg Test, Ur NEGATIVE NEGATIVE    Comment:        THE SENSITIVITY OF THIS METHODOLOGY IS >24 mIU/mL     MAU COURSE Urine Pregnancy Test  ASSESSMENT [id number] 36yo amenorrheic w. Unremarkable physical exam; negative urine pregnancy test .   PLAN Discharge home     Romeo AppleChukwu, Chika I, Medical Student 11/12/2017 11:07 AM    CNM attestation:  I have seen and examined this patient and agree with above documentation in the medical student's note.   Tara [name] is a 37 y.o. [id number] at Unknown reporting side pain off and on and a question about why she has not had a period in three months. She also thinks that her partner's condom came off during intercourse and is still in her vagina. Patient denies vaginal bleeding, abnormal  discharge, NV, dysuria.  PE: Patient Vitals for the past 24 hrs:  BP Temp Pulse Resp Height Weight  11/12/17 1201  137/68 - 71 17 - -  11/12/17 1055 131/78 - 79 - - -  11/12/17 1035 (!) 157/94 98.1 F (36.7 C) 87 18 5\' 3"  (1.6 m) 205 lb (93 kg)   Gen: calm comfortable, NAD Resp: normal effort, no distress Heart: Regular rate Abd: Soft, NT, gravid,  ROS, labs, PMH reviewed  Orders Placed This Encounter  Procedures  . Urinalysis, Routine w reflex microscopic  . Pregnancy, urine POC  . Discharge patient Discharge disposition: 01-Home or Self Care; Discharge patient date: 11/12/2017   No orders of the defined types were placed in this encounter.   MDM -physical exam unremarkable; no foreign bodies in the vagina.  -pregnant test negative  Assessment: 1. Side pain   2. Amenorrhea     Plan: - Discharge home in stable condition. - Advised patient that if she has any concerns about STI she should visit [facility]. Also visit [facility] for IUD if she desires contraception.  -Amenorrhea may be due to obesity, thyroid issues, explained that many medical conditions can cause amenorrhea. If patient does not  -Return to maternity admissions symptoms worsen  Marylene Land, CNM 11/12/2017 3:02 PM

## 2017-11-12 NOTE — MAU Note (Addendum)
Pt reports she has had abd pain and nausea/diarrhea x 2 days. Has not had period x 3 months took HPT and it was  Negative.  Also reports she had intercourse 2 days ago an thinks the condom may still be inside because they could not find it after.

## 2017-11-12 NOTE — MAU Note (Signed)
Started 3 days ago, pain in left side first, n/v/d started 2 days, no vomiting or diarrhea today.  Some cramping. Wondering why she hasn't had a period in 3 months- never had irreg cycles.   Neg HPT x3, last was ~10 days ago.  Also could not find condom after intercourse 2 days ago

## 2018-04-27 IMAGING — US US MFM OB COMP +14 WKS
1 series · 14 of 28 positions shown · non-contrast
Comparison: none

[Series 1: us mfm ob comp +14 wks · 83 acquisitions, 14 frames shown]
[im 4/83]
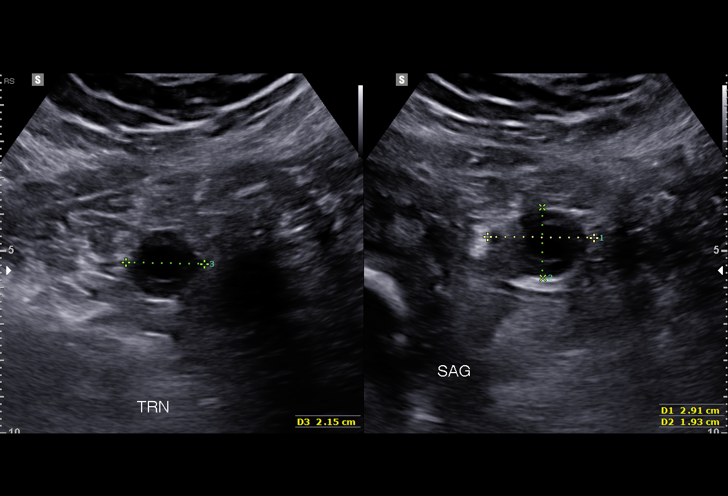
[im 10/83]
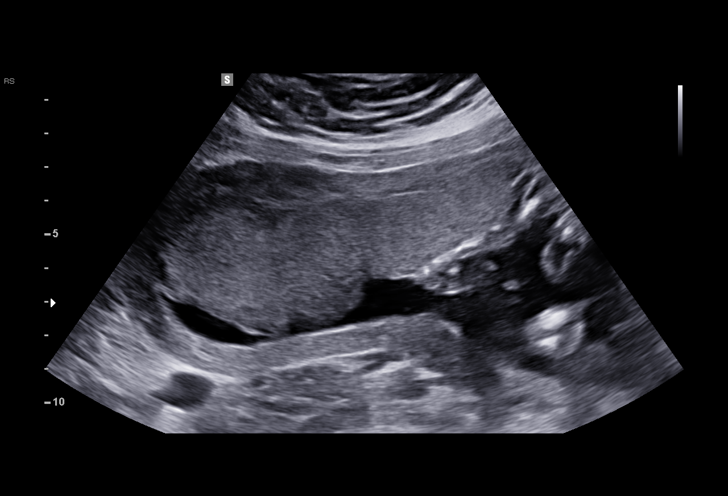
[im 16/83]
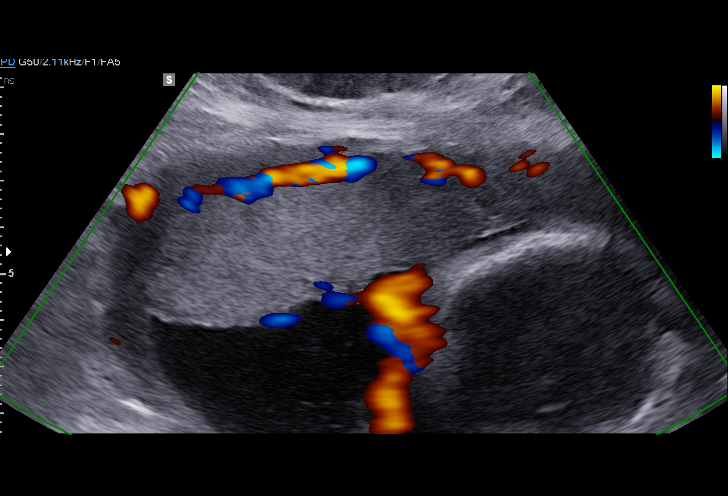
[im 22/83]
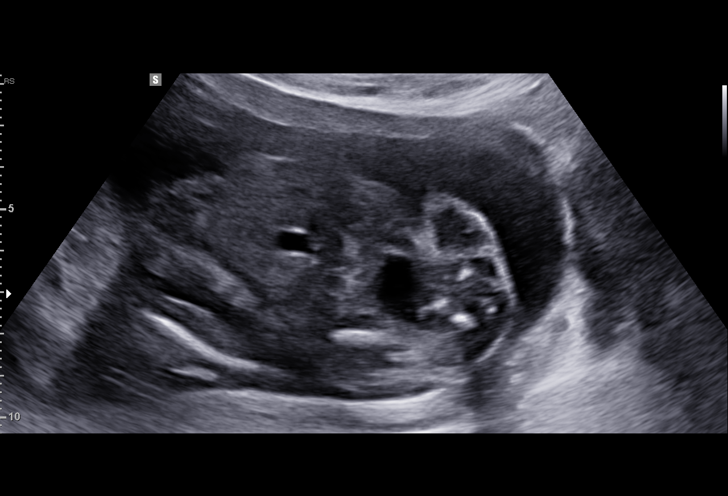
[im 28/83]
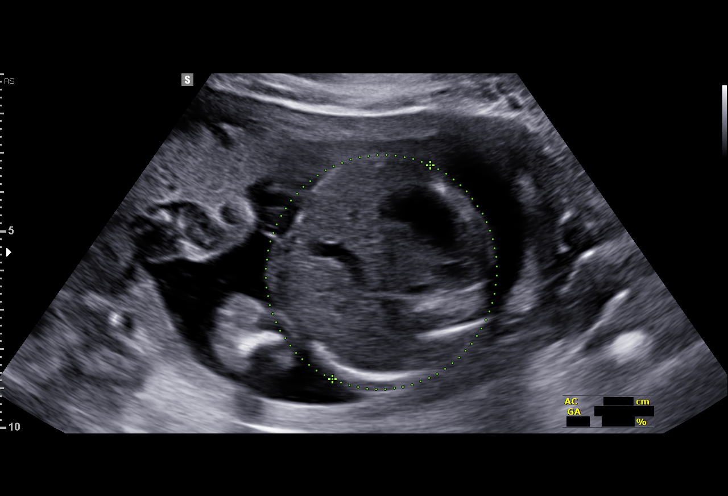
[im 34/83]
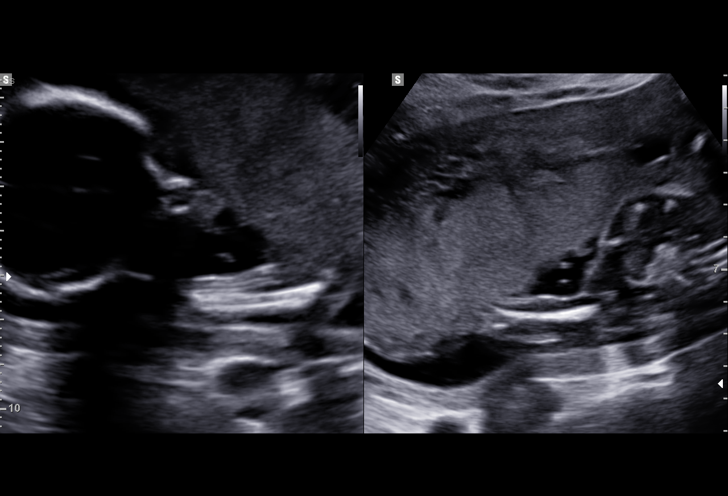
[im 40/83]
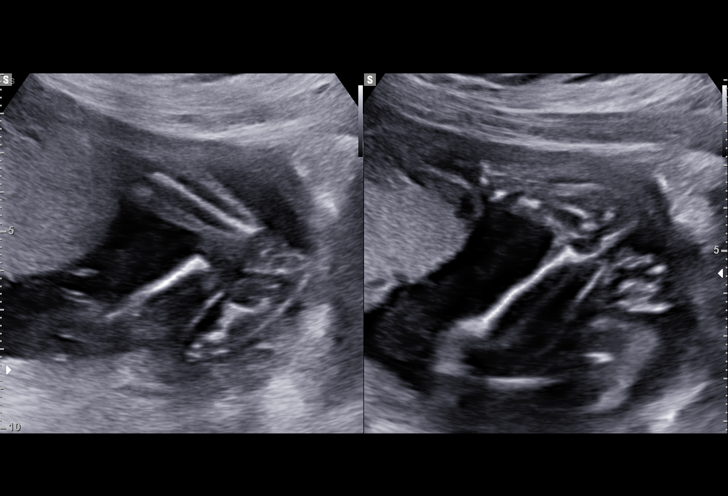
[im 46/83]
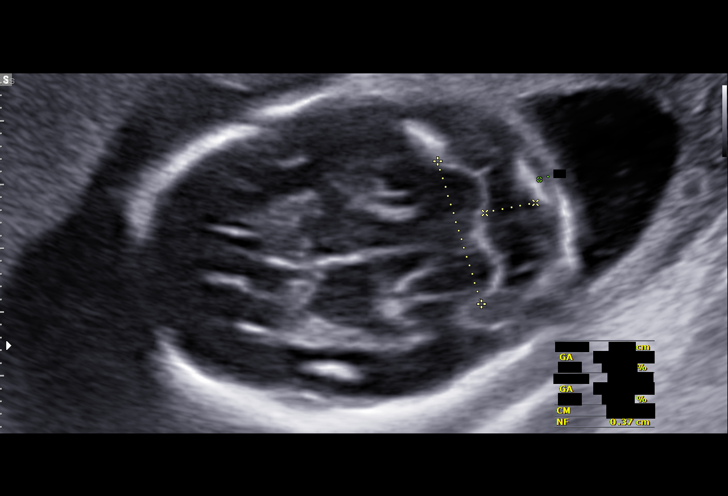
[im 52/83]
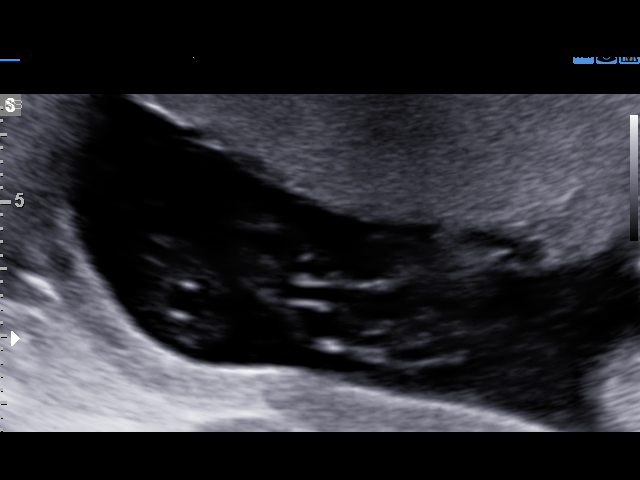
[im 58/83]
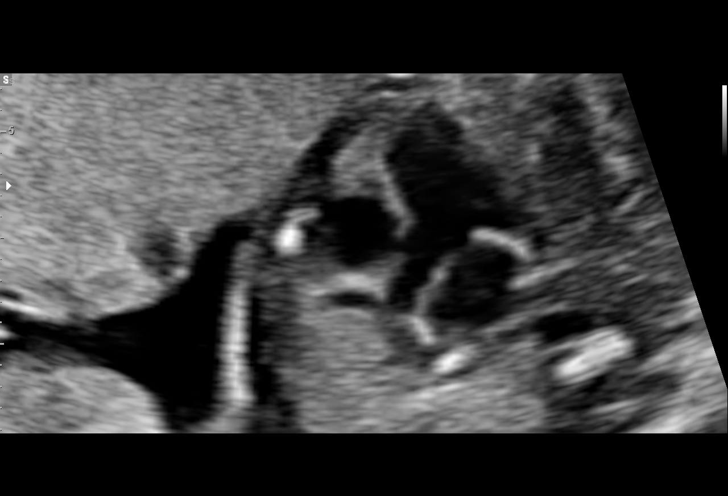
[im 64/83]
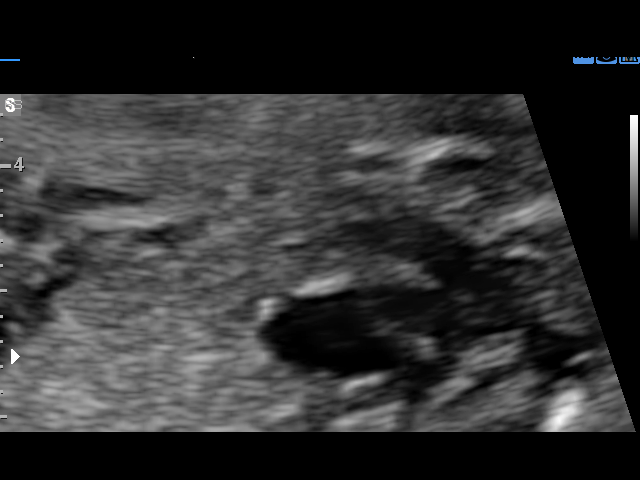
[im 70/83]
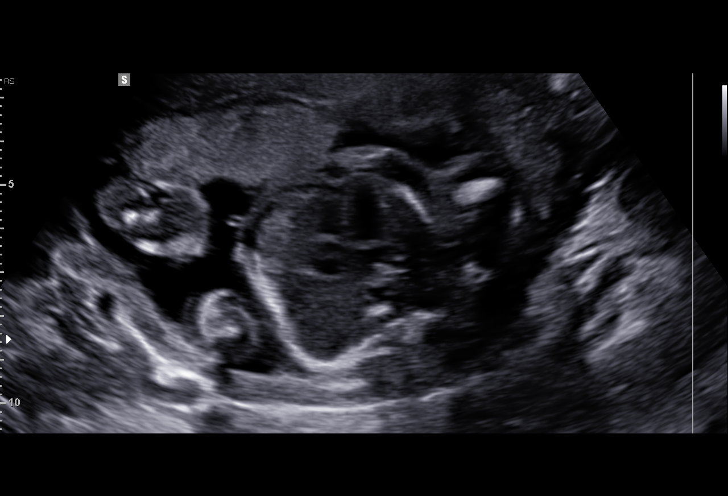
[im 76/83]
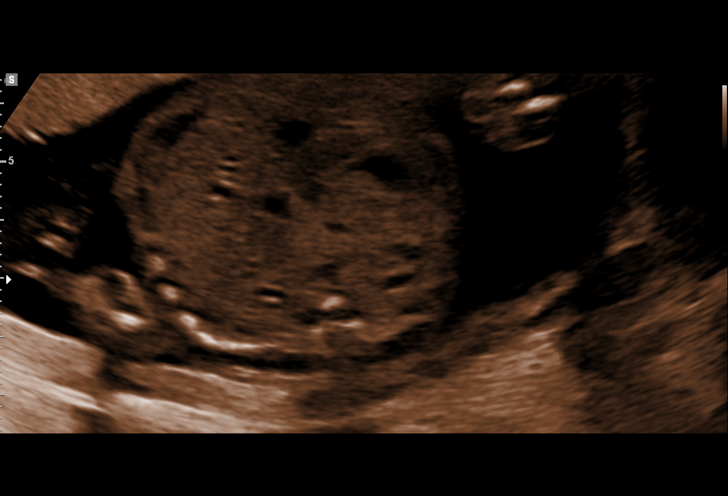
[im 83/83]
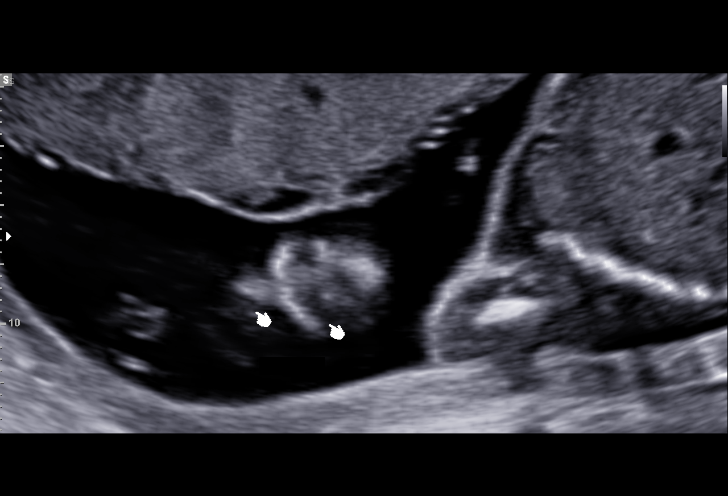

[14 of 28 positions shown; findings below may reference images not displayed]

RODRIGUEZ

Indications

21 weeks gestation of pregnancy
Basic anatomic survey                          Z36
Late prenatal care, second trimester
OB History

Gravidity:    4         Term:   2
TOP:          1        Living:  2
Fetal Evaluation

Num Of Fetuses:     1
Fetal Heart         150
Rate(bpm):
Cardiac Activity:   Observed
Presentation:       Variable
Placenta:           Anterior Fundal, above cervical os
P. Cord Insertion:  Visualized, central

Amniotic Fluid
AFI FV:      Subjectively within normal limits

Largest Pocket(cm)
4.56
Biometry
BPD:      52.4  mm     G. Age:  21w 6d         82  %    CI:        68.31   %    70 - 86
FL/HC:       16.7  %    15.9 -
HC:      202.7  mm     G. Age:  22w 3d         91  %    HC/AC:       1.10       1.06 -
AC:       184   mm     G. Age:  23w 1d         95  %    FL/BPD:      64.7  %
FL:       33.9  mm     G. Age:  20w 5d         29  %    FL/AC:       18.4  %    20 - 24
HUM:      34.1  mm     G. Age:  21w 4d         62  %
CER:      23.4  mm     G. Age:  21w 5d         71  %
NFT:       3.7  mm
CM:        8.1  mm

Est. FW:     476   gm     1 lb 1 oz     60  %
Gestational Age

LMP:           21w 0d        Date:  06/29/15                 EDD:   04/04/16
U/S Today:     22w 0d                                        EDD:   03/28/16
Best:          21w 0d     Det. By:  LMP  (06/29/15)          EDD:   04/04/16
Anatomy

Cranium:               Appears normal         Aortic Arch:            Not well visualized
Cavum:                 Appears normal         Ductal Arch:            Appears normal
Ventricles:            Appears normal         Diaphragm:              Appears normal
Choroid Plexus:        Appears normal         Stomach:                Appears normal, left
sided
Cerebellum:            Appears normal         Abdomen:                Appears normal
Posterior Fossa:       Appears normal         Abdominal Wall:         Appears nml (cord
insert, abd wall)
Nuchal Fold:           Appears normal         Cord Vessels:           Appears normal (3
vessel cord)
Face:                  Orbits appear          Kidneys:                Appear normal
normal
Lips:                  Appears normal         Bladder:                Appears normal
Thoracic:              Appears normal         Spine:                  Not well visualized
Heart:                 Appears normal         Upper Extremities:      Appears normal
(4CH, axis, and situs
RVOT:                  Appears normal         Lower Extremities:      Appears normal
LVOT:                  Appears normal

Other:  Fetus appears to be a male. Heels visualized. Technically difficult due
to maternal habitus and fetal position.
Cervix Uterus Adnexa

Cervix
Length:           4.49  cm.
Normal appearance by transabdominal scan.

Uterus
No abnormality visualized.

Left Ovary
Within normal limits.

Right Ovary
Within normal limits.

Adnexa:       No abnormality visualized. No adnexal mass
visualized.
Impression

SIUP at 85wks
active Fytos fetus
no dysmorphic features
limitations as above
no previa
Recommendations

Interval growth and completion of anatomy in 4 weeks.

## 2018-10-29 ENCOUNTER — Ambulatory Visit
Admission: EM | Admit: 2018-10-29 | Discharge: 2018-10-29 | Disposition: A | Discharge status: Home / Self Care | Payer: Self-pay | Attending: Emergency Medicine | Admitting: Emergency Medicine

## 2018-10-29 ENCOUNTER — Other Ambulatory Visit: Payer: Self-pay

## 2018-10-29 DIAGNOSIS — T161XXA Foreign body in right ear, initial encounter: Secondary | ICD-10-CM | POA: Insufficient documentation

## 2018-10-29 DIAGNOSIS — N898 Other specified noninflammatory disorders of vagina: Secondary | ICD-10-CM | POA: Insufficient documentation

## 2018-10-29 MED ORDER — NEOMYCIN-POLYMYXIN-HC 3.5-10000-1 OT SUSP: 4.0000 [drp] | Freq: Three times a day (TID) | OTIC | 0 refills | Status: DC

## 2018-10-29 NOTE — Discharge Instructions (Signed)
El uso diario de unas gotas de aceite de oliva en el odo, as como el uso de [place] de bulbo para enjuagar el canal auditivo, para finalmente evacuar el insecto. uso de gotas para los odos segn lo previsto para prevenir infecciones y para ayudar con la inflamacin. lo llamar con cualquier resultado positivo de su hisopo vaginal y le enviar medicamentos a la farmacia si est indicado.

## 2018-10-29 NOTE — ED Provider Notes (Signed)
[name]    CSN: [id number] Arrival date & time: 10/29/18  1022     History   Chief Complaint Chief Complaint  Patient presents with  . Foreign Body in Ear    HPI Tara [name] is a 38 y.o. female.   Tara [name] presents with complaints of insect in right ear. States she was able to get a lot of it out but feels like some is still present, with decreased hearing. Has tried some water and oil to the ear without significant improvement. Also complaints of vaginal itching. Denies vaginal discharge. Last period was June 4th, states she is not sexually active, denies concerns for pregnancy or STD's. No urinary complaints, pelvic or back pain. Without contributing medical history.    Spanish video interpreter used to collect history and physical exam.    ROS per HPI, negative if not otherwise mentioned.      Past Medical History:  Diagnosis Date  . Hypertension    no meds    Patient Active Problem List   Diagnosis Date Noted  . Encounter for IUD removal 12/08/2016  . Contraception management 06/13/2016  . Elevated blood pressure reading 06/13/2016  . Preeclampsia, third trimester 03/25/2016  . GERD (gastroesophageal reflux disease) 03/05/2016  . Previous cesarean delivery, antepartum condition or complication 02/04/2016  . Encounter for supervision of normal pregnancy 11/27/2015    Past Surgical History:  Procedure Laterality Date  . CESAREAN SECTION    . THERAPEUTIC ABORTION      OB History    Gravida  4   Para  3   Term  3   Preterm  0   AB  1   Living  3     SAB  0   TAB  1   Ectopic  0   Multiple  0   Live Births  3            Home Medications    Prior to Admission medications   Medication Sig Start Date End Date Taking? Authorizing Provider  acetaminophen (TYLENOL) 325 MG tablet Take 650 mg by mouth every 6 (six) hours as needed for moderate pain or headache.    [provider]   ibuprofen (ADVIL,MOTRIN) 600 MG tablet Take 1 tablet (600 mg total) by mouth every 6 (six) hours. 03/27/16   Katrinka BlazingSmith, [place], CNM  Prenatal Vit-Fe Fumarate-FA (PRENATAL MULTIVITAMIN) TABS tablet Take 1 tablet by mouth daily at 12 noon.    [provider]    Family History Family History  Problem Relation Age of Onset  . Diabetes Mother   . Hypertension Mother     Social History Social History   Tobacco Use  . Smoking status: Never Smoker  . Smokeless tobacco: Never Used  Substance Use Topics  . Alcohol use: No  . Drug use: No     Allergies   Patient has no known allergies.   Review of Systems Review of Systems   Physical Exam Triage Vital Signs ED Triage Vitals  Enc Vitals Group     BP 10/29/18 1034 (!) 150/75     Pulse Rate 10/29/18 1034 81     Resp 10/29/18 1034 16     Temp 10/29/18 1034 98.6 F (37 C)     Temp Source 10/29/18 1034 Oral     SpO2 10/29/18 1034 96 %     Weight --      Height --      Head Circumference --  Peak Flow --      Pain Score 10/29/18 1036 0     Pain Loc --      Pain Edu? --      Excl. in [place]? --    No data found.  Updated Vital Signs BP (!) 150/75 (BP Location: Left Arm)   Pulse 81   Temp 98.6 F (37 C) (Oral)   Resp 16   LMP 10/16/2018   SpO2 96%    Physical Exam Constitutional:      General: She is not in acute distress.    Appearance: She is well-developed.  HENT:     Right Ear: A foreign body is present.     Left Ear: Tympanic membrane, ear canal and external ear normal.     Ears:     Comments: Part of insect deep in canal, wings? Appear flat against the canal; mild redness to canal s/p irrigation; irrigation completed by nursing staff; unable to irrigate the insect out; forceps used unsuccessfully as well.  Cardiovascular:     Rate and Rhythm: Normal rate.  Pulmonary:     Effort: Pulmonary effort is normal.  Skin:    General: Skin is warm and dry.  Neurological:     Mental Status: She is alert  and oriented to person, place, and time.      UC Treatments / Results  Labs (all labs ordered are listed, but only abnormal results are displayed) Labs Reviewed  CERVICOVAGINAL ANCILLARY ONLY    EKG   Radiology No results found.  Procedures Procedures (including critical care time)  Medications Ordered in UC Medications - No data to display  Initial Impression / Assessment and Plan / UC Course  I have reviewed the triage vital signs and the nursing notes.  Pertinent labs & imaging results that were available during my care of the patient were reviewed by me and considered in my medical decision making (see chart for details).     Unable to evacuate apparent insect from right ear canal. Appears to be a piece of it. Discussed using bulb syringe and oil to the ear to loosen and continue to irrigate it, to eventually remove. Otherwise to follow up with ENT. Cortisporin drops provided to help with swelling and prevent infection. Vaginal cytology pending. Will notify of any positive findings and if any changes to treatment are needed.  Return precautions provided. Patient verbalized understanding and agreeable to plan.   Final Clinical Impressions(s) / UC Diagnoses   Final diagnoses:  Foreign body of right ear, initial encounter  Vaginal itching     Discharge Instructions     El uso diario de unas gotas de aceite de [employer] odo, as como el uso de [place] de bulbo para enjuagar el canal auditivo, para finalmente evacuar el insecto. uso de gotas para los odos segn lo previsto para prevenir infecciones y para ayudar con la inflamacin. lo llamar con cualquier resultado positivo de su hisopo vaginal y le enviar medicamentos a la farmacia si est indicado.    ED Prescriptions    None     Controlled Substance Prescriptions [place] Controlled Substance Registry consulted? Not Applicable   Zigmund Gottron, NP 10/29/18 1142

## 2018-10-29 NOTE — ED Triage Notes (Signed)
Pt states felt something fly in her rt ear 2 nights ago, was able to get some out and feels like there is still something in there

## 2018-11-03 LAB — CERVICOVAGINAL ANCILLARY ONLY
Bacterial vaginitis: NEGATIVE
Candida vaginitis: NEGATIVE
Chlamydia: NEGATIVE
Neisseria Gonorrhea: NEGATIVE
Trichomonas: NEGATIVE

## 2018-12-29 ENCOUNTER — Ambulatory Visit: Payer: Self-pay | Admitting: Women's Health

## 2018-12-29 DIAGNOSIS — Z0289 Encounter for other administrative examinations: Secondary | ICD-10-CM

## 2019-03-02 ENCOUNTER — Other Ambulatory Visit: Payer: Self-pay

## 2019-03-02 ENCOUNTER — Emergency Department (HOSPITAL_COMMUNITY)
Admission: EM | Admit: 2019-03-02 | Discharge: 2019-03-02 | Disposition: A | Discharge status: Home / Self Care | Payer: Self-pay | Attending: Emergency Medicine | Admitting: Emergency Medicine

## 2019-03-02 ENCOUNTER — Encounter (HOSPITAL_COMMUNITY): Payer: Self-pay | Admitting: Emergency Medicine

## 2019-03-02 ENCOUNTER — Encounter: Payer: Self-pay | Admitting: Physician Assistant

## 2019-03-02 DIAGNOSIS — R748 Abnormal levels of other serum enzymes: Secondary | ICD-10-CM | POA: Insufficient documentation

## 2019-03-02 DIAGNOSIS — R21 Rash and other nonspecific skin eruption: Secondary | ICD-10-CM | POA: Insufficient documentation

## 2019-03-02 DIAGNOSIS — Z79899 Other long term (current) drug therapy: Secondary | ICD-10-CM | POA: Insufficient documentation

## 2019-03-02 DIAGNOSIS — R7989 Other specified abnormal findings of blood chemistry: Secondary | ICD-10-CM

## 2019-03-02 DIAGNOSIS — T7840XA Allergy, unspecified, initial encounter: Secondary | ICD-10-CM | POA: Insufficient documentation

## 2019-03-02 LAB — CBC WITH DIFFERENTIAL/PLATELET
Abs Immature Granulocytes: 0.02 10*3/uL (ref 0.00–0.07)
Basophils Absolute: 0 10*3/uL (ref 0.0–0.1)
Basophils Relative: 0 %
Eosinophils Absolute: 0 10*3/uL (ref 0.0–0.5)
Eosinophils Relative: 0 %
HCT: 44.2 % (ref 36.0–46.0)
Hemoglobin: 14.4 g/dL (ref 12.0–15.0)
Immature Granulocytes: 0 %
Lymphocytes Relative: 32 %
Lymphs Abs: 1.6 10*3/uL (ref 0.7–4.0)
MCH: 30.8 pg (ref 26.0–34.0)
MCHC: 32.6 g/dL (ref 30.0–36.0)
MCV: 94.4 fL (ref 80.0–100.0)
Monocytes Absolute: 0.4 10*3/uL (ref 0.1–1.0)
Monocytes Relative: 7 %
Neutro Abs: 2.9 10*3/uL (ref 1.7–7.7)
Neutrophils Relative %: 61 %
Platelets: 263 10*3/uL (ref 150–400)
RBC: 4.68 MIL/uL (ref 3.87–5.11)
RDW: 13.9 % (ref 11.5–15.5)
WBC: 4.9 10*3/uL (ref 4.0–10.5)
nRBC: 0 % (ref 0.0–0.2)

## 2019-03-02 LAB — URINALYSIS, ROUTINE W REFLEX MICROSCOPIC
Bacteria, UA: NONE SEEN
Bilirubin Urine: NEGATIVE
Glucose, UA: NEGATIVE mg/dL
Ketones, ur: 5 mg/dL — AB
Leukocytes,Ua: NEGATIVE
Nitrite: NEGATIVE
Protein, ur: 30 mg/dL — AB
Specific Gravity, Urine: 1.005 (ref 1.005–1.030)
pH: 7 (ref 5.0–8.0)

## 2019-03-02 LAB — COMPREHENSIVE METABOLIC PANEL
ALT: 580 U/L — ABNORMAL HIGH (ref 0–44)
AST: 559 U/L — ABNORMAL HIGH (ref 15–41)
Albumin: 3.8 g/dL (ref 3.5–5.0)
Alkaline Phosphatase: 128 U/L — ABNORMAL HIGH (ref 38–126)
Anion gap: 10 (ref 5–15)
BUN: <5 mg/dL — ABNORMAL LOW (ref 6–20)
CO2: 26 mmol/L (ref 22–32)
Calcium: 9.1 mg/dL (ref 8.9–10.3)
Chloride: 103 mmol/L (ref 98–111)
Creatinine, Ser: 0.49 mg/dL (ref 0.44–1.00)
GFR calc Af Amer: >60 mL/min (ref >60)
GFR calc non Af Amer: >60 mL/min (ref >60)
Glucose, Bld: 110 mg/dL — ABNORMAL HIGH (ref 70–99)
Potassium: 3.7 mmol/L (ref 3.5–5.1)
Sodium: 139 mmol/L (ref 135–145)
Total Bilirubin: 0.5 mg/dL (ref 0.3–1.2)
Total Protein: 7.5 g/dL (ref 6.5–8.1)

## 2019-03-02 MED ORDER — DEXAMETHASONE SODIUM PHOSPHATE 10 MG/ML IJ SOLN
10.0000 mg | Freq: Once | INTRAMUSCULAR | Status: AC
Start: 2019-03-02 — End: 2019-03-02
  Administered 2019-03-02: 13:00:00 10 mg via INTRAVENOUS
  Filled 2019-03-02: qty 1

## 2019-03-02 MED ORDER — DIPHENHYDRAMINE HCL 25 MG PO CAPS
25.0000 mg | ORAL_CAPSULE | Freq: Once | ORAL | Status: AC
Start: 2019-03-02 — End: 2019-03-02
  Administered 2019-03-02: 13:00:00 25 mg via ORAL
  Filled 2019-03-02: qty 1

## 2019-03-02 MED ORDER — FAMOTIDINE IN NACL 20-0.9 MG/50ML-% IV SOLN
20.0000 mg | Freq: Once | INTRAVENOUS | Status: AC
Start: 2019-03-02 — End: 2019-03-02
  Administered 2019-03-02: 13:00:00 20 mg via INTRAVENOUS
  Filled 2019-03-02: qty 50

## 2019-03-02 NOTE — ED Triage Notes (Addendum)
C/o feeling sleepy x 3 days and was concerned that her BP was elevated.  Went to a clinic 3 days ago in [place] due to hives to L side of abd, nausea, and vomiting.  Currently taking an Amoxicillin. States rash/hives was better yesterday and returned last night.  Denies nausea and vomiting now.

## 2019-03-02 NOTE — ED Notes (Signed)
Per interpreter pt bought "Amoxicillin" and took for vaginal discharge.  Concerned with hives went to u/c was prescribed Amoxicillin, still getting intermittant widespread hives.  Pt did not tell u/c that she had already taken "Amoxicillin".

## 2019-03-02 NOTE — ED Provider Notes (Signed)
[facility] EMERGENCY DEPARTMENT Provider Note   CSN: [id number] Arrival date & time: 03/02/19  1001     History   Chief Complaint Chief Complaint  Patient presents with  . Hypertension  . Rash    HPI Tara [name] is a 38 y.o. female presenting for evaluation of rash.  Patient states 3 days ago she was started on amoxicillin for urinary symptoms.  Since then, she has stomach discomfort, nausea, and dizziness after taking the amoxicillin.  She also reports hives since starting the medication.  Patient states it was noted that she has high blood pressure, she was started on blood pressure medicine as well but she is not sure what it is.  She denies lip, tongue, or throat swelling.  Denies chest pain or shortness of breath.  She has cough.  She denies vomiting.  Denies current nausea or abdominal pain.  She reports urinary symptoms have resolved, no dysuria, hematuria, urinary frequency.  She denies abnormal bowel movements.  She has no other medical problems, takes no medications daily.  She has been told she has problems with her liver and will need to see a stomach doctor.       HPI  Past Medical History:  Diagnosis Date  . Hypertension    no meds    Patient Active Problem List   Diagnosis Date Noted  . Encounter for IUD removal 12/08/2016  . Contraception management 06/13/2016  . Elevated blood pressure reading 06/13/2016  . Preeclampsia, third trimester 03/25/2016  . GERD (gastroesophageal reflux disease) 03/05/2016  . Previous cesarean delivery, antepartum condition or complication 02/04/2016  . Encounter for supervision of normal pregnancy 11/27/2015    Past Surgical History:  Procedure Laterality Date  . CESAREAN SECTION    . THERAPEUTIC ABORTION       OB History    Gravida  4   Para  3   Term  3   Preterm  0   AB  1   Living  3     SAB  0   TAB  1   Ectopic  0   Multiple  0   Live Births  3             Home Medications    Prior to Admission medications   Medication Sig Start Date End Date Taking? Authorizing Provider  acetaminophen (TYLENOL) 325 MG tablet Take 650 mg by mouth every 6 (six) hours as needed for moderate pain or headache.    [provider]  ibuprofen (ADVIL,MOTRIN) 600 MG tablet Take 1 tablet (600 mg total) by mouth every 6 (six) hours. 03/27/16   Katrinka Blazing, [place], CNM  neomycin-polymyxin-hydrocortisone (CORTISPORIN) 3.5-10000-1 OTIC suspension Place 4 drops into the right ear 3 (three) times daily. 10/29/18   Georgetta Haber, NP  Prenatal Vit-Fe Fumarate-FA (PRENATAL MULTIVITAMIN) TABS tablet Take 1 tablet by mouth daily at 12 noon.    [provider]    Family History Family History  Problem Relation Age of Onset  . Diabetes Mother   . Hypertension Mother     Social History Social History   Tobacco Use  . Smoking status: Never Smoker  . Smokeless tobacco: Never Used  Substance Use Topics  . Alcohol use: No  . Drug use: No     Allergies   Amoxicillin   Review of Systems Review of Systems  Gastrointestinal: Positive for nausea (after taking amoxil).  Skin: Positive for rash.  All other systems reviewed and are  negative.    Physical Exam Updated Vital Signs BP (!) 159/110   Pulse 79   Temp 98.5 F (36.9 C) (Oral)   Resp 18   SpO2 98%   Physical Exam Vitals signs and nursing note reviewed.  Constitutional:      General: She is not in acute distress.    Appearance: She is well-developed.     Comments: Appears nontoxic  HENT:     Head: Normocephalic and atraumatic.  Eyes:     Conjunctiva/sclera: Conjunctivae normal.     Pupils: Pupils are equal, round, and reactive to light.  Neck:     Musculoskeletal: Normal range of motion and neck supple.  Cardiovascular:     Rate and Rhythm: Normal rate and regular rhythm.     Pulses: Normal pulses.  Pulmonary:     Effort: Pulmonary effort is normal. No respiratory distress.      Breath sounds: Normal breath sounds. No wheezing.     Comments: Speaking in full sentences.  Clear lung sounds in all fields.  No signs of respiratory distress. Abdominal:     General: There is no distension.     Palpations: Abdomen is soft. There is no mass.     Tenderness: There is no abdominal tenderness. There is no guarding or rebound.     Comments: No TTP of the abd. Soft without rigidity, guarding, distention.   Musculoskeletal: Normal range of motion.  Skin:    General: Skin is warm and dry.     Capillary Refill: Capillary refill takes less than 2 seconds.     Findings: Rash present.     Comments: Urticarial rash noted mostly on the left leg and left side abdomen.  Neurological:     Mental Status: She is alert and oriented to person, place, and time.      ED Treatments / Results  Labs (all labs ordered are listed, but only abnormal results are displayed) Labs Reviewed  COMPREHENSIVE METABOLIC PANEL - Abnormal; Notable for the following components:      Result Value   Glucose, Bld 110 (*)    BUN <5 (*)    AST 559 (*)    ALT 580 (*)    Alkaline Phosphatase 128 (*)    All other components within normal limits  URINALYSIS, ROUTINE W REFLEX MICROSCOPIC - Abnormal; Notable for the following components:   APPearance HAZY (*)    Hgb urine dipstick LARGE (*)    Ketones, ur 5 (*)    Protein, ur 30 (*)    All other components within normal limits  CBC WITH DIFFERENTIAL/PLATELET  I-STAT BETA HCG BLOOD, ED ([facility], [facility], [facility] ONLY)    EKG None  Radiology No results found.  Procedures Procedures (including critical care time)  Medications Ordered in ED Medications  dexamethasone (DECADRON) injection 10 mg (10 mg Intravenous Given 03/02/19 1242)  famotidine (PEPCID) IVPB 20 mg premix (0 mg Intravenous Stopped 03/02/19 1446)  diphenhydrAMINE (BENADRYL) capsule 25 mg (25 mg Oral Given 03/02/19 1240)     Initial Impression / Assessment and Plan / ED Course  I have reviewed  the triage vital signs and the nursing notes.  Pertinent labs & imaging results that were available during my care of the patient were reviewed by me and considered in my medical decision making (see chart for details).        Pt presenting for evaluation of rash. physical exam reassuring, pt without signs of anaphylaxis. urticarial rash present, consistent with allergic reaction,  especially considering pt recently started amoxil. Also consider allergy to unknown BP med pt was started on.will obtain basic labs as pt reports being told she needs to see GI, but not sure why.   Labs show elevated LFTs. otherwise reassuring, with tx, pt's rash resolved. As pt does not have a PCP, will consult with case management.   Discussed with Tara [name], pt set up for apt on 12/10. Pt given information to f/u with GI. Discussed elevated liver enzymes. Pt told to not drink etoh and not to use tylenol. Discussed use of benadryl as needed for rash. At this time, pt appears safe for d/c. Return precautions given. pt states she understands and agrees to plan.   Final Clinical Impressions(s) / ED Diagnoses   Final diagnoses:  Allergic reaction, initial encounter  Elevated LFTs    ED Discharge Orders    None       Alveria ApleyCaccavale, Barbara Ahart, PA-C 03/02/19 1657    Terald Sleeperrifan, Matthew J, MD 03/02/19 [phone]

## 2019-03-02 NOTE — Discharge Instructions (Addendum)
No toma el ampicillin o pastillas por presion alta.  Va a la cita con el doctor debajo [facility]) el 03/24/2019 a 3:30 para una visita.  Da Ardelia Mems cita con el doctor de [facility]om[facility]) para mas evaluacion del higado.  Si las ronchas regresan, toma benadryl.  Regresa a la sala de emergencia si tiene nuevas sintomas.

## 2019-03-02 NOTE — Discharge Planning (Signed)
Odaly Peri J. Clydene Laming, RN, BSN, [place] [phone]  [facility] set up appointment with [place] on 12/10 @3 :11.  Spoke with pt at bedside and advised to please arrive 15 min early and take a picture ID and your current medications.  Pt verbalizes understanding of keeping appointment.

## 2019-03-21 ENCOUNTER — Ambulatory Visit
Admission: EM | Admit: 2019-03-21 | Discharge: 2019-03-21 | Disposition: A | Discharge status: Home / Self Care | Payer: Self-pay | Attending: Physician Assistant | Admitting: Physician Assistant

## 2019-03-21 ENCOUNTER — Other Ambulatory Visit: Payer: Self-pay

## 2019-03-21 ENCOUNTER — Ambulatory Visit
Admission: EM | Admit: 2019-03-21 | Discharge: 2019-03-21 | Disposition: A | Discharge status: Home / Self Care | Payer: Self-pay

## 2019-03-21 DIAGNOSIS — R319 Hematuria, unspecified: Secondary | ICD-10-CM | POA: Insufficient documentation

## 2019-03-21 DIAGNOSIS — H6591 Unspecified nonsuppurative otitis media, right ear: Secondary | ICD-10-CM

## 2019-03-21 DIAGNOSIS — H6981 Other specified disorders of Eustachian tube, right ear: Secondary | ICD-10-CM

## 2019-03-21 DIAGNOSIS — H6091 Unspecified otitis externa, right ear: Secondary | ICD-10-CM

## 2019-03-21 DIAGNOSIS — T161XXA Foreign body in right ear, initial encounter: Secondary | ICD-10-CM

## 2019-03-21 DIAGNOSIS — R350 Frequency of micturition: Secondary | ICD-10-CM | POA: Insufficient documentation

## 2019-03-21 LAB — POCT URINALYSIS DIP (MANUAL ENTRY)
Bilirubin, UA: NEGATIVE
Glucose, UA: NEGATIVE mg/dL
Ketones, POC UA: NEGATIVE mg/dL
Leukocytes, UA: NEGATIVE
Nitrite, UA: NEGATIVE
Protein Ur, POC: 100 mg/dL — AB
Spec Grav, UA: 1.02 (ref 1.010–1.025)
Urobilinogen, UA: 1 E.U./dL
pH, UA: 8.5 — AB (ref 5.0–8.0)

## 2019-03-21 MED ORDER — FLUTICASONE PROPIONATE 50 MCG/ACT NA SUSP: 2.0000 | Freq: Every day | NASAL | 0 refills | Status: DC

## 2019-03-21 MED ORDER — NEOMYCIN-POLYMYXIN-HC 3.5-10000-1 OT SUSP: 4.0000 [drp] | Freq: Three times a day (TID) | OTIC | 0 refills | Status: AC

## 2019-03-21 NOTE — ED Provider Notes (Signed)
[name]    CSN: [id number] Arrival date & time: 03/21/19  6387      History   Chief Complaint Chief Complaint  Patient presents with  . Otalgia    HPI Tara [name] is a 38 y.o. female.   38 year old female comes in for 3 month history of right ear pain.  HPI obtained by patient through video translator.  States about 3 months ago, had bug in the right ear that was unable to be removed.  She was given eardrops, but unsure if the bug is still in the ear.  For the past 3 months, she has had right ear pain without any drainage or changes in hearing.  In the past week, she has had headache, dizziness, nasal congestion.  States headache is to the frontal region, and is improved with aspirin.  Denies photophobia, phonophobia, vision changes.  Denies fever, chills, body aches.  Denies abdominal pain, nausea, vomiting, diarrhea.  Denies shortness of breath, loss of taste or smell.  Given symptoms, she had Covid testing a week ago with negative results.     Past Medical History:  Diagnosis Date  . Elevated LFTs   . Hypertension    no meds    Patient Active Problem List   Diagnosis Date Noted  . Encounter for IUD removal 12/08/2016  . Contraception management 06/13/2016  . Elevated blood pressure reading 06/13/2016  . Preeclampsia, third trimester 03/25/2016  . GERD (gastroesophageal reflux disease) 03/05/2016  . Previous cesarean delivery, antepartum condition or complication 56/43/3295  . Encounter for supervision of normal pregnancy 11/27/2015    Past Surgical History:  Procedure Laterality Date  . CESAREAN SECTION    . THERAPEUTIC ABORTION      OB History    Gravida  4   Para  3   Term  3   Preterm  0   AB  1   Living  3     SAB  0   TAB  1   Ectopic  0   Multiple  0   Live Births  3            Home Medications    Prior to Admission medications   Medication Sig Start Date End Date Taking? Authorizing Provider   lisinopril (ZESTRIL) 20 MG tablet Take 20 mg by mouth daily.   Yes [provider]  acetaminophen (TYLENOL) 325 MG tablet Take 650 mg by mouth every 6 (six) hours as needed for moderate pain or headache.    [provider]  fluticasone (FLONASE) 50 MCG/ACT nasal spray Place 2 sprays into both nostrils daily. 03/21/19   Tasia Catchings,  V, PA-C  ibuprofen (ADVIL,MOTRIN) 600 MG tablet Take 1 tablet (600 mg total) by mouth every 6 (six) hours. 03/27/16   Tamala Julian, [place], CNM  neomycin-polymyxin-hydrocortisone (CORTISPORIN) 3.5-10000-1 OTIC suspension Place 4 drops into the right ear 3 (three) times daily for 7 days. 03/21/19 03/28/19  Ok Edwards, PA-C    Family History Family History  Problem Relation Age of Onset  . Diabetes Mother   . Hypertension Mother     Social History Social History   Tobacco Use  . Smoking status: Never Smoker  . Smokeless tobacco: Never Used  Substance Use Topics  . Alcohol use: No  . Drug use: No     Allergies   Amoxicillin   Review of Systems Review of Systems  Reason unable to perform ROS: See HPI as above.  Physical Exam Triage Vital Signs ED Triage Vitals  Enc Vitals Group     BP 03/21/19 0912 (!) 155/72     Pulse Rate 03/21/19 0912 81     Resp 03/21/19 0912 18     Temp 03/21/19 0912 98.4 F (36.9 C)     Temp Source 03/21/19 0912 Oral     SpO2 03/21/19 0912 96 %     Weight --      Height --      Head Circumference --      Peak Flow --      Pain Score 03/21/19 0913 5     Pain Loc --      Pain Edu? --      Excl. in [place]? --    No data found.  Updated Vital Signs BP (!) 155/72 (BP Location: Left Arm)   Pulse 81   Temp 98.4 F (36.9 C) (Oral)   Resp 18   LMP 03/15/2019   SpO2 96%   Physical Exam Constitutional:      General: She is not in acute distress.    Appearance: Normal appearance. She is not ill-appearing, toxic-appearing or diaphoretic.  HENT:     Head: Normocephalic and atraumatic.     Left Ear: Tympanic  membrane, ear canal and external ear normal. Tympanic membrane is not erythematous or bulging.     Ears:     Comments: No tenderness to palpation of right tragus. Right ear canal with insect to the posterior canal. Ear canal with swelling and erythema. TM partially obstructed, with mid ear effusion.   Post ear irrigation: foreign body removed, no residual foreign body seen. TM fully visible, with mid ear effusion.    Nose:     Right Sinus: No maxillary sinus tenderness or frontal sinus tenderness.     Left Sinus: No maxillary sinus tenderness or frontal sinus tenderness.     Mouth/Throat:     Mouth: Mucous membranes are moist.     Pharynx: Oropharynx is clear. Uvula midline.  Eyes:     Extraocular Movements: Extraocular movements intact.     Conjunctiva/sclera: Conjunctivae normal.     Pupils: Pupils are equal, round, and reactive to light.  Neck:     Musculoskeletal: Normal range of motion and neck supple.  Cardiovascular:     Rate and Rhythm: Normal rate and regular rhythm.     Heart sounds: Normal heart sounds. No murmur. No friction rub. No gallop.   Pulmonary:     Effort: Pulmonary effort is normal. No accessory muscle usage, prolonged expiration, respiratory distress or retractions.     Comments: Lungs clear to auscultation without adventitious lung sounds. Neurological:     General: No focal deficit present.     Mental Status: She is alert and oriented to person, place, and time.     Coordination: Coordination is intact.     Gait: Gait is intact.    UC Treatments / Results  Labs (all labs ordered are listed, but only abnormal results are displayed) Labs Reviewed - No data to display  EKG   Radiology No results found.  Procedures Procedures (including critical care time)  Medications Ordered in UC Medications - No data to display  Initial Impression / Assessment and Plan / UC Course  I have reviewed the triage vital signs and the nursing notes.  Pertinent labs  & imaging results that were available during my care of the patient were reviewed by me and considered in my medical  decision making (see chart for details).    Foreign body in right ear canal, will attempt removal with ear irrigation.  Foreign body removed with irrigation. No further foreign body seen. Will cover for otitis externa with cortisporin given ear canal swelling with erythema. Will start flonase for eustachian tube dysfunction, possibly causing dizziness. Patient to follow up with ENT for further evaluation if symptoms not improving. Return precautions given. Patient expresses understanding and agrees to plan.  Final Clinical Impressions(s) / UC Diagnoses   Final diagnoses:  Foreign body of right ear, initial encounter  Fluid level behind tympanic membrane of right ear   ED Prescriptions    Medication Sig Dispense Auth. Provider   fluticasone (FLONASE) 50 MCG/ACT nasal spray Place 2 sprays into both nostrils daily. 1 g ,  V, PA-C   neomycin-polymyxin-hydrocortisone (CORTISPORIN) 3.5-10000-1 OTIC suspension Place 4 drops into the right ear 3 (three) times daily for 7 days. 4.2 mL Belinda Fisher,  V, PA-C     PDMP not reviewed this encounter.   Belinda Fisher,  V, PA-C 03/21/19 1048

## 2019-03-21 NOTE — ED Triage Notes (Signed)
Pt c/o rt ear pain x75months, states seen here for same and "had an animal in it", tx with antibiotic drops but pain is still there now causing an headache.

## 2019-03-21 NOTE — Discharge Instructions (Signed)
Foreign body removed. Start flonase, cortisporin as directed. If symptoms not improving, follow up with PCP for further evaluation needed.

## 2019-03-21 NOTE — ED Provider Notes (Signed)
[name]    CSN: [id number] Arrival date & time: 03/21/19  1325      History   Chief Complaint Chief Complaint  Patient presents with  . Urinary Frequency    HPI Kellyn Mccary is a 38 y.o. female.   38 year old female returns for hematuria after being seen earlier today for unrelated complaint. States returned as she has had 3 episodes of hematuria today. Has had urinary frequency as well. Denies dysuria. Denies abdominal pain, nausea, vomiting, diarrhea. Denies fever, chills, body aches. Denies vaginal discharge, itching. LMP 03/15/2019, irregular cycles.      Past Medical History:  Diagnosis Date  . Elevated LFTs   . Hypertension    no meds    Patient Active Problem List   Diagnosis Date Noted  . Encounter for IUD removal 12/08/2016  . Contraception management 06/13/2016  . Elevated blood pressure reading 06/13/2016  . Preeclampsia, third trimester 03/25/2016  . GERD (gastroesophageal reflux disease) 03/05/2016  . Previous cesarean delivery, antepartum condition or complication 02/04/2016  . Encounter for supervision of normal pregnancy 11/27/2015    Past Surgical History:  Procedure Laterality Date  . CESAREAN SECTION    . THERAPEUTIC ABORTION      OB History    Gravida  4   Para  3   Term  3   Preterm  0   AB  1   Living  3     SAB  0   TAB  1   Ectopic  0   Multiple  0   Live Births  3            Home Medications    Prior to Admission medications   Medication Sig Start Date End Date Taking? Authorizing Provider  acetaminophen (TYLENOL) 325 MG tablet Take 650 mg by mouth every 6 (six) hours as needed for moderate pain or headache.    [provider]  fluticasone (FLONASE) 50 MCG/ACT nasal spray Place 2 sprays into both nostrils daily. 03/21/19   Cathie Hoops, Eulalah Rupert V, PA-C  ibuprofen (ADVIL,MOTRIN) 600 MG tablet Take 1 tablet (600 mg total) by mouth every 6 (six) hours. 03/27/16   Katrinka Blazing, [place], CNM   lisinopril (ZESTRIL) 20 MG tablet Take 20 mg by mouth daily.    [provider]  neomycin-polymyxin-hydrocortisone (CORTISPORIN) 3.5-10000-1 OTIC suspension Place 4 drops into the right ear 3 (three) times daily for 7 days. 03/21/19 03/28/19  Belinda Fisher, PA-C    Family History Family History  Problem Relation Age of Onset  . Diabetes Mother   . Hypertension Mother     Social History Social History   Tobacco Use  . Smoking status: Never Smoker  . Smokeless tobacco: Never Used  Substance Use Topics  . Alcohol use: No  . Drug use: No     Allergies   Amoxicillin   Review of Systems Review of Systems  Reason unable to perform ROS: See HPI as above.     Physical Exam Triage Vital Signs ED Triage Vitals  Enc Vitals Group     BP 03/21/19 1336 (!) 152/103     Pulse Rate 03/21/19 1336 70     Resp 03/21/19 1336 16     Temp 03/21/19 1336 98.1 F (36.7 C)     Temp Source 03/21/19 1336 Oral     SpO2 03/21/19 1336 98 %     Weight --      Height --      Head  Circumference --      Peak Flow --      Pain Score 03/21/19 1339 0     Pain Loc --      Pain Edu? --      Excl. in [place]? --    No data found.  Updated Vital Signs BP (!) 152/103 (BP Location: Left Arm)   Pulse 70   Temp 98.1 F (36.7 C) (Oral)   Resp 16   LMP 03/15/2019   SpO2 98%   Physical Exam Constitutional:      General: She is not in acute distress.    Appearance: She is well-developed. She is not ill-appearing, toxic-appearing or diaphoretic.  HENT:     Head: Normocephalic and atraumatic.  Eyes:     Conjunctiva/sclera: Conjunctivae normal.     Pupils: Pupils are equal, round, and reactive to light.  Cardiovascular:     Rate and Rhythm: Normal rate and regular rhythm.     Heart sounds: Normal heart sounds. No murmur. No friction rub. No gallop.   Pulmonary:     Effort: Pulmonary effort is normal.     Breath sounds: Normal breath sounds. No wheezing or rales.  Abdominal:     General:  Bowel sounds are normal.     Palpations: Abdomen is soft.     Tenderness: There is no abdominal tenderness. There is no right CVA tenderness, left CVA tenderness, guarding or rebound.  Genitourinary:    Comments: Deferred per patient Skin:    General: Skin is warm and dry.  Neurological:     Mental Status: She is alert and oriented to person, place, and time.  Psychiatric:        Behavior: Behavior normal.        Judgment: Judgment normal.     UC Treatments / Results  Labs (all labs ordered are listed, but only abnormal results are displayed) Labs Reviewed  POCT URINALYSIS DIP (MANUAL ENTRY) - Abnormal; Notable for the following components:      Result Value   Clarity, UA cloudy (*)    Blood, UA large (*)    pH, UA 8.5 (*)    Protein Ur, POC =100 (*)    All other components within normal limits  URINE CULTURE    EKG   Radiology No results found.  Procedures Procedures (including critical care time)  Medications Ordered in UC Medications - No data to display  Initial Impression / Assessment and Plan / UC Course  I have reviewed the triage vital signs and the nursing notes.  Pertinent labs & imaging results that were available during my care of the patient were reviewed by me and considered in my medical decision making (see chart for details).    Gross hematuria without leuks or nitrites. Discussed possible vaginal bleeding/spotting causing symptoms as patient with history of irregular cycles. Speculum exam deferred per patient. Will send for urine culture and continue to monitor. Push fluids. Return precautions given. Patient expresses understanding and agrees to plan.  Final Clinical Impressions(s) / UC Diagnoses   Final diagnoses:  Urinary frequency  Hematuria, unspecified type   ED Prescriptions    None     PDMP not reviewed this encounter.   Ok Edwards, PA-C 03/21/19 1427

## 2019-03-21 NOTE — ED Triage Notes (Signed)
Pt c/o blood in urine x3 days with urgentcy

## 2019-03-21 NOTE — Discharge Instructions (Addendum)
No signs of infection. Monitor blood in urine, this could be from the vaginal area as well. Keep hydrated, urine should be clear to pale yellow in color. Monitor for any worsening of symptoms, fever, worsening abdominal pain, nausea/vomiting, flank pain, follow up for reevaluation.

## 2019-03-22 ENCOUNTER — Ambulatory Visit: Payer: Self-pay | Admitting: Physician Assistant

## 2019-03-22 LAB — URINE CULTURE

## 2019-03-24 ENCOUNTER — Inpatient Hospital Stay (INDEPENDENT_AMBULATORY_CARE_PROVIDER_SITE_OTHER): Payer: Self-pay | Admitting: Primary Care

## 2019-03-31 ENCOUNTER — Ambulatory Visit: Payer: Self-pay | Admitting: Physician Assistant

## 2019-04-12 ENCOUNTER — Other Ambulatory Visit (INDEPENDENT_AMBULATORY_CARE_PROVIDER_SITE_OTHER): Payer: Self-pay

## 2019-04-12 ENCOUNTER — Ambulatory Visit (INDEPENDENT_AMBULATORY_CARE_PROVIDER_SITE_OTHER): Payer: Self-pay | Admitting: Physician Assistant

## 2019-04-12 ENCOUNTER — Other Ambulatory Visit: Payer: Self-pay

## 2019-04-12 ENCOUNTER — Encounter: Payer: Self-pay | Admitting: Physician Assistant

## 2019-04-12 VITALS — BP 128/84 | HR 76 | Temp 98.3°F | Ht 62.0 in | Wt 204.2 lb

## 2019-04-12 DIAGNOSIS — R7989 Other specified abnormal findings of blood chemistry: Secondary | ICD-10-CM

## 2019-04-12 DIAGNOSIS — R1013 Epigastric pain: Secondary | ICD-10-CM

## 2019-04-12 LAB — COMPREHENSIVE METABOLIC PANEL
ALT: 63 U/L — ABNORMAL HIGH (ref 0–35)
AST: 43 U/L — ABNORMAL HIGH (ref 0–37)
Albumin: 4.3 g/dL (ref 3.5–5.2)
Alkaline Phosphatase: 112 U/L (ref 39–117)
BUN: 7 mg/dL (ref 6–23)
CO2: 26 mEq/L (ref 19–32)
Calcium: 9.2 mg/dL (ref 8.4–10.5)
Chloride: 102 mEq/L (ref 96–112)
Creatinine, Ser: 0.58 mg/dL (ref 0.40–1.20)
GFR: 116.73 mL/min (ref >60.00)
Glucose, Bld: 122 mg/dL — ABNORMAL HIGH (ref 70–99)
Potassium: 3.6 mEq/L (ref 3.5–5.1)
Sodium: 135 mEq/L (ref 135–145)
Total Bilirubin: 0.3 mg/dL (ref 0.2–1.2)
Total Protein: 7.7 g/dL (ref 6.0–8.3)

## 2019-04-12 LAB — CBC WITH DIFFERENTIAL/PLATELET
Basophils Absolute: 0 10*3/uL (ref 0.0–0.1)
Basophils Relative: 0.3 % (ref 0.0–3.0)
Eosinophils Absolute: 0.1 10*3/uL (ref 0.0–0.7)
Eosinophils Relative: 1.5 % (ref 0.0–5.0)
HCT: 40 % (ref 36.0–46.0)
Hemoglobin: 13.4 g/dL (ref 12.0–15.0)
Lymphocytes Relative: 34.6 % (ref 12.0–46.0)
Lymphs Abs: 2.8 10*3/uL (ref 0.7–4.0)
MCHC: 33.4 g/dL (ref 30.0–36.0)
MCV: 92.2 fl (ref 78.0–100.0)
Monocytes Absolute: 0.4 10*3/uL (ref 0.1–1.0)
Monocytes Relative: 5.4 % (ref 3.0–12.0)
Neutro Abs: 4.8 10*3/uL (ref 1.4–7.7)
Neutrophils Relative %: 58.2 % (ref 43.0–77.0)
Platelets: 312 10*3/uL (ref 150.0–400.0)
RBC: 4.34 Mil/uL (ref 3.87–5.11)
RDW: 12.8 % (ref 11.5–15.5)
WBC: 8.2 10*3/uL (ref 4.0–10.5)

## 2019-04-12 NOTE — Progress Notes (Signed)
Subjective:    Patient ID: Tara [name], female    DOB: 1981/02/16, 38 y.o.   MRN: [id number]  HPI Tara [name] is a pleasant non-English speaking [name], new to GI today referred by [facility] for evaluation of abdominal pain and elevated LFTs. Patient says she had an episode of upper abdominal pain in mid November which lasted for about a day and was fairly intense.  This was not associated with any nausea or vomiting or fever.  She says she has continued to have milder abdominal pain which sometimes radiates into her chest and into her back.  She has had some indigestion, appetite has been fine, weight has been stable, again no associated nausea or vomiting. She did have labs done through her primary care office on 02/28/2019 which showed a normal CBC, hemoglobin A1c of 5.7, T bili 0.4/alk phos 146/AST 722/ALT 558, triglycerides 152/total cholesterol 251.  Patient has not had repeat labs since. No abdominal imaging. She has never been told that she had any liver issues in the past, and family history is negative for liver disease as far she is aware.  She does not drink alcohol on a daily basis, though she says at one point she was drinking 1 glass of wine per day because she was told it would help her blood pressure. No history of hepatitis. It sounds as if patient had some associated headache and dizziness when she initially had onset of her GI symptoms in November.  Review of Systems Pertinent positive and negative review of systems were noted in the above HPI section.  All other review of systems was otherwise negative.  Outpatient Encounter Medications as of 04/12/2019  Medication Sig  . lisinopril (ZESTRIL) 20 MG tablet Take 20 mg by mouth daily.  . [DISCONTINUED] acetaminophen (TYLENOL) 325 MG tablet Take 650 mg by mouth every 6 (six) hours as needed for moderate pain or headache.  . [DISCONTINUED] fluticasone (FLONASE) 50 MCG/ACT nasal spray Place 2  sprays into both nostrils daily. (Patient not taking: Reported on 04/12/2019)  . [DISCONTINUED] ibuprofen (ADVIL,MOTRIN) 600 MG tablet Take 1 tablet (600 mg total) by mouth every 6 (six) hours. (Patient not taking: Reported on 04/12/2019)   No facility-administered encounter medications on file as of 04/12/2019.   Allergies  Allergen Reactions  . Amoxicillin Hives   Patient Active Problem List   Diagnosis Date Noted  . Encounter for IUD removal 12/08/2016  . Contraception management 06/13/2016  . Elevated blood pressure reading 06/13/2016  . Preeclampsia, third trimester 03/25/2016  . GERD (gastroesophageal reflux disease) 03/05/2016  . Previous cesarean delivery, antepartum condition or complication 56/21/3086  . Encounter for supervision of normal pregnancy 11/27/2015   Social History   Socioeconomic History  . Marital status: Single    Spouse name: Not on file  . Number of children: Not on file  . Years of education: Not on file  . Highest education level: Not on file  Occupational History  . Not on file  Tobacco Use  . Smoking status: Never Smoker  . Smokeless tobacco: Never Used  Substance and Sexual Activity  . Alcohol use: No  . Drug use: No  . Sexual activity: Yes  Other Topics Concern  . Not on file  Social History Narrative  . Not on file   Social Determinants of Health   Financial Resource Strain:   . Difficulty of Paying Living Expenses: Not on file  Food Insecurity:   . Worried About [employer] of  Food in the Last Year: Not on file  . Ran Out of Food in the Last Year: Not on file  Transportation Needs:   . Lack of Transportation (Medical): Not on file  . Lack of Transportation (Non-Medical): Not on file  Physical Activity:   . Days of Exercise per Week: Not on file  . Minutes of Exercise per Session: Not on file  Stress:   . Feeling of Stress : Not on file  Social Connections:   . Frequency of Communication with Friends and Family: Not on file   . Frequency of Social Gatherings with Friends and Family: Not on file  . Attends Religious Services: Not on file  . Active Member of Clubs or Organizations: Not on file  . Attends Archivist Meetings: Not on file  . Marital Status: Not on file  Intimate Partner Violence:   . Fear of Current or Ex-Partner: Not on file  . Emotionally Abused: Not on file  . Physically Abused: Not on file  . Sexually Abused: Not on file    Tara [name]'s family history includes Diabetes in her mother; Hypertension in her mother.      Objective:    Vitals:   04/12/19 1336  BP: 128/84  Pulse: 76  Temp: 98.3 F (36.8 C)  SpO2: 98%    Physical Exam Well-developed well-nourished young [name] in no acute distress.  Accompanied by an interpreter  Weight 204, BMI 37.3  HEENT; nontraumatic normocephalic, EOMI, PE RR LA, sclera anicteric. Oropharynx; not examined/mask/Covid Neck; supple, no JVD Cardiovascular; regular rate and rhythm with S1-S2, no murmur rub or gallop Pulmonary; Clear bilaterally Abdomen; soft, there is mild tenderness in the epigastrium, no guarding nondistended, no palpable mass or hepatosplenomegaly, bowel sounds are active Rectal; not done Skin; benign exam, no jaundice rash or appreciable lesions Extremities; no clubbing cyanosis or edema skin warm and dry Neuro/Psych; alert and oriented x4, grossly nonfocal mood and affect appropriate       Assessment & Plan:   #18 38 year old [name] with complaints of epigastric pain, initial onset in November with one episode of significant upper abdominal pain, milder intermittent symptoms since.  This is associated with a transaminitis as of 02/28/2019.  Rule out gallbladder disease, rule out acute viral hepatitis, rule out other underlying chronic liver disease.  #2 obesity #3.  Hypertension  Plan; Will schedule for upper abdominal ultrasound CBC with differential, c-Met, ESR,acute hepatitis  panel Patient will be established with Dr. Carlean Purl, and will follow up with me in the office in 2 to 3 weeks.  Amy S Esterwood PA-C 04/12/2019   Cc: Anderson, Chelsey L, DO

## 2019-04-12 NOTE — Patient Instructions (Addendum)
Your provider has requested that you go to the basement level for lab work before leaving today. Press "B" on the elevator. The lab is located at the first door on the left as you exit the elevator.  You have been scheduled for an abdominal ultrasound at [facility] (1st floor of hospital) on 04/20/19 at 8:30am. Please arrive 15 minutes prior to your appointment for registration. Make certain not to have anything to eat or drink 6 hours prior to your appointment. Should you need to reschedule your appointment, please contact radiology at [phone]. This test typically takes about 30 minutes to perform.  Please follow up with Nicoletta Ba, PA on 05-03-2019 at 9:00am  If you are age 59 or older, your body mass index should be between 23-30. Your Body mass index is 37.35 kg/m. If this is out of the aforementioned range listed, please consider follow up with your Primary Care Provider.  If you are age 33 or younger, your body mass index should be between 19-25. Your Body mass index is 37.35 kg/m. If this is out of the aformentioned range listed, please consider follow up with your Primary Care Provider.   Due to recent changes in healthcare laws, you may see the results of your imaging and laboratory studies on MyChart before your provider has had a chance to review them.  We understand that in some cases there may be results that are confusing or concerning to you. Not all laboratory results come back in the same time frame and the provider may be waiting for multiple results in order to interpret others.  Please give [place] 48 hours in order for your provider to thoroughly review all the results before contacting the office for clarification of your results.  ___________________________________________________________________ Tara [name] proveedor ha solicitado que vaya al nivel del stano para realizar anlisis de laboratorio antes de salir hoy. Presione "B" en el ascensor. El laboratorio est ubicado en  la primera puerta a la izquierda al salir del Materials engineer.  Se le ha programado una ecografa abdominal en [facility] (primer piso del hospital) el 04/20/19 a las 8:30 am. Llegue 15 minutos antes de su cita para registrarse. Asegrese de no comer ni beber nada 6 horas antes de su cita. Si necesita reprogramar su cita, comunquese con radiologa al [phone]. Esta prueba suele tardar unos 30 minutos en realizarse.  Comunquese con Nicoletta Ba, PA el 05-03-2019 a las 9:00 am  Si tiene 65 aos o ms, su ndice de [employer] corporal debe estar entre 23-30. Su ndice de masa corporal es de 37,35 kg / m. Si est fuera del rango mencionado anteriormente, considere hacer un seguimiento con su Proveedor de Midwife.  Si tiene 52 aos o menos, su ndice de [employer] corporal debe estar entre 19-25. Su ndice de masa corporal es de 37,35 kg / m. Si esto est fuera del rango mencionado anteriormente, considere hacer un seguimiento con su Proveedor de Midwife.  Debido a cambios recientes en las leyes de atencin mdica, es posible que vea los resultados de sus estudios de diagnstico por imgenes y de laboratorio en MyChart antes de que su proveedor haya tenido la oportunidad de revisarlos. Entendemos que en algunos casos puede haber resultados confusos o preocupantes para usted. No todos los resultados de laboratorio se obtienen en el mismo perodo de tiempo y el proveedor puede estar esperando mltiples resultados para Primary school teacher. Por favor, denos 40 horas para que su proveedor revise a fondo [employer]  antes de comunicarse con la oficina para aclarar [employer].

## 2019-04-13 ENCOUNTER — Inpatient Hospital Stay (INDEPENDENT_AMBULATORY_CARE_PROVIDER_SITE_OTHER): Payer: Self-pay | Admitting: Primary Care

## 2019-04-13 LAB — HEPATITIS A ANTIBODY, TOTAL: Hepatitis A AB,Total: REACTIVE — AB

## 2019-04-13 LAB — HEPATITIS B SURFACE ANTIGEN: Hepatitis B Surface Ag: NONREACTIVE

## 2019-04-13 LAB — HEPATITIS C ANTIBODY
Hepatitis C Ab: NONREACTIVE
SIGNAL TO CUT-OFF: 0.01 (ref <1.00)

## 2019-04-13 LAB — HEPATITIS B SURFACE ANTIBODY,QUALITATIVE: Hep B S Ab: NONREACTIVE

## 2019-04-20 ENCOUNTER — Ambulatory Visit (HOSPITAL_COMMUNITY)
Admission: RE | Admit: 2019-04-20 | Discharge: 2019-04-20 | Disposition: A | Discharge status: Home / Self Care | Payer: Self-pay | Source: Ambulatory Visit | Attending: Physician Assistant | Admitting: Physician Assistant

## 2019-04-20 ENCOUNTER — Other Ambulatory Visit: Payer: Self-pay

## 2019-04-20 DIAGNOSIS — R1013 Epigastric pain: Secondary | ICD-10-CM | POA: Insufficient documentation

## 2019-04-20 DIAGNOSIS — R7989 Other specified abnormal findings of blood chemistry: Secondary | ICD-10-CM | POA: Insufficient documentation

## 2019-04-21 NOTE — Progress Notes (Signed)
Please let patient know the ultrasound shows a fatty liver, no gallstones or gallbladder wall thickening.Patient is non-English speaking, be sure she has a follow-up appointment scheduled with me.  I believe we did that at the time of her office visit.

## 2019-05-03 ENCOUNTER — Ambulatory Visit: Payer: Self-pay | Admitting: Physician Assistant

## 2019-05-16 ENCOUNTER — Ambulatory Visit (INDEPENDENT_AMBULATORY_CARE_PROVIDER_SITE_OTHER): Payer: Self-pay | Admitting: Physician Assistant

## 2019-05-16 ENCOUNTER — Encounter: Payer: Self-pay | Admitting: Physician Assistant

## 2019-05-16 ENCOUNTER — Other Ambulatory Visit (INDEPENDENT_AMBULATORY_CARE_PROVIDER_SITE_OTHER): Payer: Self-pay

## 2019-05-16 VITALS — BP 124/80 | HR 88 | Temp 98.6°F | Ht 62.5 in | Wt 202.2 lb

## 2019-05-16 DIAGNOSIS — R7989 Other specified abnormal findings of blood chemistry: Secondary | ICD-10-CM

## 2019-05-16 DIAGNOSIS — K760 Fatty (change of) liver, not elsewhere classified: Secondary | ICD-10-CM

## 2019-05-16 LAB — HEPATIC FUNCTION PANEL
ALT: 37 U/L — ABNORMAL HIGH (ref 0–35)
AST: 26 U/L (ref 0–37)
Albumin: 4.3 g/dL (ref 3.5–5.2)
Alkaline Phosphatase: 108 U/L (ref 39–117)
Bilirubin, Direct: 0 mg/dL (ref 0.0–0.3)
Total Bilirubin: 0.3 mg/dL (ref 0.2–1.2)
Total Protein: 8 g/dL (ref 6.0–8.3)

## 2019-05-16 LAB — LIPID PANEL
Cholesterol: 218 mg/dL — ABNORMAL HIGH (ref 0–200)
HDL: 41.2 mg/dL (ref >39.00)
LDL Cholesterol: 140 mg/dL — ABNORMAL HIGH (ref 0–99)
NonHDL: 177.16
Total CHOL/HDL Ratio: 5
Triglycerides: 184 mg/dL — ABNORMAL HIGH (ref 0.0–149.0)
VLDL: 36.8 mg/dL (ref 0.0–40.0)

## 2019-05-16 LAB — SEDIMENTATION RATE: Sed Rate: 43 mm/hr — ABNORMAL HIGH (ref 0–20)

## 2019-05-16 MED ORDER — LISINOPRIL 20 MG PO TABS: 20.0000 mg | ORAL_TABLET | Freq: Every day | ORAL | 1 refills | Status: DC

## 2019-05-16 NOTE — Patient Instructions (Addendum)
Enfermedad del hgado graso Fatty Liver Disease  La enfermedad del hgado graso ocurre cuando se acumula demasiada grasa en las clulas del hgado. La enfermedad del hgado graso tambin se llama esteatosis heptica o esteatohepatitis. El hgado elimina las sustancias dainas del torrente sanguneo y produce lquidos que el cuerpo necesita. Tambin ayuda al cuerpo a Risk manager y Financial controller la energa obtenida de los alimentos que come. En muchos casos, la enfermedad del hgado graso no provoca sntomas ni problemas. Con frecuencia, se diagnostica cuando se realizan estudios por otros motivos. Sin embargo, con el [place], el hgado graso puede provocar una inflamacin que posiblemente cause problemas hepticos ms graves, como la fibrosis heptica (cirrosis) o insuficiencia heptica. El hgado graso se asocia con la resistencia a la insulina, el aumento de la grasa corporal, la presin arterial alta (hipertensin) y el colesterol elevado. Estas son caractersticas del sndrome metablico y [place] el riesgo de accidente cerebrovascular, diabetes y enfermedad cardaca. Cules son las causas? Esta afeccin puede ser causada por lo siguiente:  Beber alcohol en exceso.  Mala alimentacin.  Obesidad.  Sndrome de [place].  Diabetes.  Colesterol alto.  Determinados medicamentos.  Txicos.  Algunas infecciones virales.  Embarazo. Qu incrementa el riesgo? Es ms probable que tenga esta afeccin si:  Consume alcohol en exceso.  Tiene sobrepeso.  Tiene diabetes.  Tiene hepatitis.  Tiene un nivel alto de triglicridos.  Est embarazada. Cules son los signos o los sntomas? Con frecuencia, la enfermedad del hgado graso no provoca sntomas. Si se desarrollan sntomas, estos pueden incluir:  Fatiga.  Debilidad.  Prdida de peso.  Confusin.  Dolor abdominal.  Nuseas y vmitos.  Color amarillo en la piel y en la zona blanca de los ojos (ictericia).  Picazn en la  piel. Cmo se diagnostica? Esta afeccin puede diagnosticarse mediante:  Un examen fsico y antecedentes mdicos.  Anlisis de [place].  Estudios de diagnstico por imgenes, como ecografa, exploracin por tomografa computarizada (TC) o Health visitor (RM).  Biopsia de hgado. Se extrae una pequea muestra de tejido del hgado usando [place]. La muestra se examina en el microscopio. Cmo se trata? Con frecuencia, la enfermedad del hgado graso es causada por otras afecciones. El tratamiento para el hgado graso puede incluir medicamentos y cambios en el estilo de vida para controlar enfermedades como:  Alcoholismo.  Colesterol alto.  Diabetes.  Tener exceso de [place] u obesidad. Siga estas indicaciones en su casa:   No beba alcohol. Si tiene problemas para dejar de beber, consulte al mdico cmo puede dejar de beber de forma segura con la ayuda de medicamentos o un programa con supervisin. Esto es importante para evitar que la afeccin empeore.  Siga una dieta saludable segn lo indicado por su mdico. Consulte al mdico sobre trabajar con un especialista en alimentacin y nutricin (nutricionista) a fin de Ship broker plan de alimentacin.  Haga ejercicio regularmente. Esto puede ayudarlo a [facility], y a Academic librarian y la diabetes. Hable con el mdico sobre qu actividades son mejores para usted y Audiological scientist de [place].  Tome los medicamentos de venta libre y los recetados solamente como se lo haya indicado el mdico.  Consulting civil engineer a todas las visitas de control como se lo haya indicado el mdico. Esto es importante. Comunquese con un mdico si: Tiene dificultad para controlar lo siguiente:  Nivel de Dispensing optician. Esto es muy importante si tiene diabetes.  El colesterol.  El consumo de alcohol. Solicite ayuda de inmediato si:  Siente dolor abdominal.  Tiene ictericia.  Tiene nuseas y vmitos.  Vomita sangre de color rojo  brillante o una sustancia similar a los granos de caf.  Las heces son negras, alquitranadas o sanguinolentas. Resumen  La enfermedad del hgado graso se desarrolla cuando se acumula demasiada grasa en las clulas del hgado.  Con frecuencia, la enfermedad del hgado no causa sntomas ni problemas. Sin embargo, con Museum/gallery conservator, el hgado graso puede provocar una inflamacin que puede causar problemas hepticos ms graves, como fibrosis heptica (cirrosis).  Es ms probable que desarrolle esta afeccin si consume alcohol en exceso, est embarazada, tiene sobrepeso, diabetes, hepatitis o altos niveles de triglicridos.  Comunquese con el mdico si tiene problemas para [employer], los niveles de azcar en la sangre, el colesterol o el consumo de alcohol.   Esta informacin no tiene Theme park manager el consejo del mdico. Asegrese de hacerle al mdico cualquier pregunta que tenga. Document Revised: 03/30/2017 Document Reviewed: 03/30/2017 Elsevier Patient Education  [employer].   If you are age 38 or older, your body mass index should be between 23-30. Your Body mass index is 36.4 kg/m. If this is out of the aforementioned range listed, please consider follow up with your Primary Care Provider.  If you are age 38 or younger, your body mass index should be between 19-25. Your Body mass index is 36.4 kg/m. If this is out of the aformentioned range listed, please consider follow up with your Primary Care Provider.   Your provider has requested that you go to the basement level for lab work before leaving today. Press "B" on the elevator. The lab is located at the first door on the left as you exit the elevator.  We have sent the following medications to your pharmacy for you to pick up at your convenience:  Lisinipril 20mg  1 by mouth daily  Due to recent changes in healthcare laws, you may see the results of your imaging and laboratory studies on MyChart before your provider  has had a chance to review them.  We understand that in some cases there may be results that are confusing or concerning to you. Not all laboratory results come back in the same time frame and the provider may be waiting for multiple results in order to interpret others.  Please give 48 hours in order for your provider to thoroughly review all the results before contacting the office for clarification of your results.   Follow up in 1 year

## 2019-05-16 NOTE — Progress Notes (Signed)
Subjective:    Patient ID: Tara [name], female    DOB: 24-Dec-1980, 39 y.o.   MRN: [id number]  HPI Tara [name] is a pleasant 39 year old [name], non-English-speaking who comes in today for follow-up.  She was seen here on 04/12/2019 with complaints of recent upper abdominal pain and elevated LFTs.  She had initially had onset of upper abdominal pain in November 2019 which lasted about a day and was fairly intense.  After that she had had continued milder abdominal pain and some indigestion.  Her symptoms had actually significantly improved by the time she was seen here in the office.  Labs were reviewed and she was noted to have significant transaminitis in November 2020 with T bili 0.4 alk phos 146/AST 722/ALT 588, triglycerides 152/total cholesterol 251.  Upper abdominal ultrasound was ordered which showed increased hepatic echogenicity consistent with fatty liver.  Repeat LFTs were markedly improved with AST 43/ALT 63, CBC within normal limits, hepatitis A, B, and C serologies negative. Patient had been drinking a glass of wine per day or beer daily as she had been told this would be good for her blood pressure.  She stopped this back in the fall and has not resumed. She says she is feeling good today and has not been having any abdominal discomfort.  Appetite has been good no nausea, no indigestion. No family history of liver disease that she is aware of.  Review of Systems Pertinent positive and negative review of systems were noted in the above HPI section.  All other review of systems was otherwise negative.  Outpatient Encounter Medications as of 05/16/2019  Medication Sig  . lisinopril (ZESTRIL) 20 MG tablet Take 20 mg by mouth daily.  Marland Kitchen lisinopril (ZESTRIL) 20 MG tablet Take 1 tablet (20 mg total) by mouth daily.   No facility-administered encounter medications on file as of 05/16/2019.   Allergies  Allergen Reactions  . Amoxicillin Hives   Patient Active Problem List     Diagnosis Date Noted  . Encounter for IUD removal 12/08/2016  . Contraception management 06/13/2016  . Elevated blood pressure reading 06/13/2016  . Preeclampsia, third trimester 03/25/2016  . GERD (gastroesophageal reflux disease) 03/05/2016  . Previous cesarean delivery, antepartum condition or complication 95/18/8416  . Encounter for supervision of normal pregnancy 11/27/2015   Social History   Socioeconomic History  . Marital status: Single    Spouse name: Not on file  . Number of children: Not on file  . Years of education: Not on file  . Highest education level: Not on file  Occupational History  . Not on file  Tobacco Use  . Smoking status: Never Smoker  . Smokeless tobacco: Never Used  Substance and Sexual Activity  . Alcohol use: No  . Drug use: No  . Sexual activity: Yes  Other Topics Concern  . Not on file  Social History Narrative  . Not on file   Social Determinants of Health   Financial Resource Strain:   . Difficulty of Paying Living Expenses: Not on file  Food Insecurity:   . Worried About Charity fundraiser in the Last Year: Not on file  . Ran Out of Food in the Last Year: Not on file  Transportation Needs:   . Lack of Transportation (Medical): Not on file  . Lack of Transportation (Non-Medical): Not on file  Physical Activity:   . Days of Exercise per Week: Not on file  . Minutes of Exercise per Session: Not on file  Stress:   . Feeling of Stress : Not on file  Social Connections:   . Frequency of Communication with Friends and Family: Not on file  . Frequency of Social Gatherings with Friends and Family: Not on file  . Attends Religious Services: Not on file  . Active Member of Clubs or Organizations: Not on file  . Attends Archivist Meetings: Not on file  . Marital Status: Not on file  Intimate Partner Violence:   . Fear of Current or Ex-Partner: Not on file  . Emotionally Abused: Not on file  . Physically Abused: Not on file   . Sexually Abused: Not on file    Tara [name]'s family history includes Diabetes in her mother; Hypertension in her mother.      Objective:    Vitals:   05/16/19 0921  BP: 124/80  Pulse: 88  Temp: 98.6 F (37 C)    Physical Exam Well-developed well-nourished [name] in no acute distress.  Accompanied by interpreter height, Weight, 202 BMI 36.4  HEENT; nontraumatic normocephalic, EOMI, PER R LA, sclera anicteric. Oropharynx; not examined Neck; supple, no JVD Cardiovascular; regular rate and rhythm with S1-S2, no murmur rub or gallop Pulmonary; Clear bilaterally Abdomen; soft, nontender, nondistended, no palpable mass or hepatosplenomegaly, bowel sounds are active       Assessment & Plan:   #23 39 year old non-English-speaking [name] with episode of epigastric pain November 2020 associated with a transaminitis.  Etiology is not clear, and pneumonitis had about normalized by December 2020. Ultrasound shows no evidence for gallbladder disease.  Rule out possible viral syndrome.  #2 fatty liver-she may have NASH.,  Had been drinking some alcohol on a daily basis though just 1 drink per day, now discontinued. Rule out underlying autoimmune or  inheritable liver disease.  #3 obesity #4 hypertension  Plan; patient asked me to refill her lisinopril 20 mg p.o. every morning.  She does not currently have a PCP.  She was advised at last visit and again today to establish with primary care, suggested [facility].  I refilled lisinopril x2 months.  We discussed fatty liver disease, and management with weight loss, exercise, and normalization of hyperlipidemia.  Advise she remain off EtOH.  Repeat hepatic panel today, check sed rate, ANA, smooth muscle antibody, antimitochondrial antibody, ceruloplasmin and alpha-1 antitrypsin.  If above labs are unrevealing, will plan office follow-up in 1 year or sooner as needed. Patient is established with Dr.  Olene Floss PA-C 05/16/2019   Cc: Anderson, Chelsey L, DO

## 2019-05-20 LAB — MITOCHONDRIAL ANTIBODIES: Mitochondrial M2 Ab, IgG: <=20 U

## 2019-05-20 LAB — CERULOPLASMIN: Ceruloplasmin: 44 mg/dL (ref 18–53)

## 2019-05-20 LAB — ALPHA-1-ANTITRYPSIN: A-1 Antitrypsin, Ser: 152 mg/dL (ref 83–199)

## 2019-05-20 LAB — ANA: Anti Nuclear Antibody (ANA): NEGATIVE

## 2019-05-20 LAB — ANTI-SMOOTH MUSCLE ANTIBODY, IGG: Actin (Smooth Muscle) Antibody (IGG): 24 U — ABNORMAL HIGH (ref <20)

## 2019-05-24 ENCOUNTER — Other Ambulatory Visit: Payer: Self-pay

## 2019-05-24 DIAGNOSIS — R7989 Other specified abnormal findings of blood chemistry: Secondary | ICD-10-CM

## 2019-06-28 ENCOUNTER — Ambulatory Visit (INDEPENDENT_AMBULATORY_CARE_PROVIDER_SITE_OTHER): Payer: Self-pay | Admitting: Primary Care

## 2019-06-30 ENCOUNTER — Other Ambulatory Visit: Payer: Self-pay

## 2019-06-30 ENCOUNTER — Encounter (INDEPENDENT_AMBULATORY_CARE_PROVIDER_SITE_OTHER): Payer: Self-pay | Admitting: Family Medicine

## 2019-06-30 ENCOUNTER — Ambulatory Visit (INDEPENDENT_AMBULATORY_CARE_PROVIDER_SITE_OTHER): Payer: Self-pay | Admitting: Family Medicine

## 2019-06-30 DIAGNOSIS — K760 Fatty (change of) liver, not elsewhere classified: Secondary | ICD-10-CM

## 2019-06-30 DIAGNOSIS — I10 Essential (primary) hypertension: Secondary | ICD-10-CM

## 2019-06-30 DIAGNOSIS — R7989 Other specified abnormal findings of blood chemistry: Secondary | ICD-10-CM

## 2019-06-30 MED ORDER — LISINOPRIL 20 MG PO TABS: 20.0000 mg | ORAL_TABLET | Freq: Every day | ORAL | 6 refills | Status: DC

## 2019-06-30 NOTE — Progress Notes (Signed)
Virtual Visit via Telephone Note  I connected with Tara [name], on 06/30/2019 at 10:03 AM by telephone due to the COVID-19 pandemic and verified that I am speaking with the correct person using two identifiers.   Consent: I discussed the limitations, risks, security and privacy concerns of performing an evaluation and management service by telephone and the availability of in person appointments. I also discussed with the patient that there may be a patient responsible charge related to this service. The patient expressed understanding and agreed to proceed.   Location of Patient: Home  Location of Provider: Clinic   Persons participating in Telemedicine visit: Leather Ortiz-Rodriguez Tempest Roberts-CMA Interpreter - De Nurse ID [id number] Dr. Alvis Lemmings     History of Present Illness: 39 year old female with a history of Hypertension here to establish care Followed by GI due to elevated LFTs but most recent LFTs have trended down AlkP/AST/ALT- 128/559/580 in 02/2019 down to 108/26/37 in 05/2019 Abdominal ultrasound from 04/2018 revealed: IMPRESSION: 1. Diffuse increase in liver echogenicity, a finding indicative of hepatic steatosis. No focal liver lesions are evident; it must be cautioned that the sensitivity of ultrasound for detection of focal liver lesions is somewhat diminished in this circumstance.  2. Portions of pancreas obscured by gas. Visualized portions of pancreas appear unremarkable.  3.  Study otherwise unremarkable.  Follow-up appointment with GI comes up in the next couple of weeks. She is currently on lisinopril for management of her hypertension and reports doing well.  She denies presence of side effects.  Blood pressure was 124/80 at her GI visit last month.  She has no chest pains, dyspnea, paroxysmal nocturnal dyspnea, pedal edema. She is due for a Pap smear and a flu shot and has no additional concerns today.  Past Medical History:  Diagnosis Date   . Elevated LFTs   . Hypertension    no meds   Allergies  Allergen Reactions  . Amoxicillin Hives    Current Outpatient Medications on File Prior to Visit  Medication Sig Dispense Refill  . lisinopril (ZESTRIL) 20 MG tablet Take 1 tablet (20 mg total) by mouth daily. 30 tablet 1   No current facility-administered medications on file prior to visit.    Observations/Objective: Awake, alert, oriented x3 Not in acute distress  Assessment and Plan: 1. Essential hypertension Controlled Counseled on blood pressure goal of less than 130/80, low-sodium, DASH diet, medication compliance, 150 minutes of moderate intensity exercise per week. Discussed medication compliance, adverse effects. - lisinopril (ZESTRIL) 20 MG tablet; Take 1 tablet (20 mg total) by mouth daily.  Dispense: 30 tablet; Refill: 6  2. Fatty liver Likely etiology of elevated LFTs Work on low-cholesterol diet, exercise  3. Elevated liver function tests Trending down See #2 above   Follow Up Instructions: Return for annual physical and PAP smear.    I discussed the assessment and treatment plan with the patient. The patient was provided an opportunity to ask questions and all were answered. The patient agreed with the plan and demonstrated an understanding of the instructions.   The patient was advised to call back or seek an in-person evaluation if the symptoms worsen or if the condition fails to improve as anticipated.     I provided 11 minutes total of non-face-to-face time during this encounter including median intraservice time, reviewing previous notes, investigations, ordering medications, medical decision making, coordinating care and patient verbalized understanding at the end of the visit.     Hoy Register, MD, FAAFP. [place]  [facility] and [place]   06/30/2019, 10:03 AM

## 2019-06-30 NOTE — Progress Notes (Signed)
Pt does not have Bp cuff to check pressure at home  Pt had appointment with gastro back in February Bp reading at that visit was 124/80

## 2019-07-14 ENCOUNTER — Ambulatory Visit (INDEPENDENT_AMBULATORY_CARE_PROVIDER_SITE_OTHER): Payer: Self-pay | Admitting: Primary Care

## 2019-07-14 ENCOUNTER — Encounter (INDEPENDENT_AMBULATORY_CARE_PROVIDER_SITE_OTHER): Payer: Self-pay | Admitting: Primary Care

## 2019-07-14 ENCOUNTER — Other Ambulatory Visit: Payer: Self-pay

## 2019-07-14 ENCOUNTER — Other Ambulatory Visit (HOSPITAL_COMMUNITY)
Admission: RE | Admit: 2019-07-14 | Discharge: 2019-07-14 | Disposition: A | Discharge status: Home / Self Care | Payer: Self-pay | Source: Ambulatory Visit | Attending: Primary Care | Admitting: Primary Care

## 2019-07-14 VITALS — BP 150/91 | HR 82 | Temp 97.3°F | Ht 62.5 in | Wt 205.4 lb

## 2019-07-14 DIAGNOSIS — Z124 Encounter for screening for malignant neoplasm of cervix: Secondary | ICD-10-CM

## 2019-07-14 DIAGNOSIS — I10 Essential (primary) hypertension: Secondary | ICD-10-CM

## 2019-07-14 DIAGNOSIS — Z113 Encounter for screening for infections with a predominantly sexual mode of transmission: Secondary | ICD-10-CM | POA: Insufficient documentation

## 2019-07-14 DIAGNOSIS — Z0000 Encounter for general adult medical examination without abnormal findings: Secondary | ICD-10-CM

## 2019-07-14 NOTE — Progress Notes (Signed)
Established Patient Office Visit  Subjective:  Patient ID: Tara [name], female    DOB: [birth year]  Age: 39 y.o. MRN: [id number]  CC:  Chief Complaint  Patient presents with  . Annual Exam  . Gynecologic Exam    HPI Tara [name] presents for well woman physical- gyn. She voices no problems or concerns.  Past Medical History:  Diagnosis Date  . Elevated LFTs   . Hypertension    no meds    Past Surgical History:  Procedure Laterality Date  . CESAREAN SECTION    . THERAPEUTIC ABORTION      Family History  Problem Relation Age of Onset  . Diabetes Mother   . Hypertension Mother     Social History   Socioeconomic History  . Marital status: Single    Spouse name: Not on file  . Number of children: Not on file  . Years of education: Not on file  . Highest education level: Not on file  Occupational History  . Not on file  Tobacco Use  . Smoking status: Never Smoker  . Smokeless tobacco: Never Used  Substance and Sexual Activity  . Alcohol use: No  . Drug use: No  . Sexual activity: Yes  Other Topics Concern  . Not on file  Social History Narrative  . Not on file   Social Determinants of Health   Financial Resource Strain:   . Difficulty of Paying Living Expenses:   Food Insecurity:   . Worried About Programme researcher, broadcasting/film/video in the Last Year:   . Barista in the Last Year:   Transportation Needs:   . Freight forwarder (Medical):   Marland Kitchen Lack of Transportation (Non-Medical):   Physical Activity:   . Days of Exercise per Week:   . Minutes of Exercise per Session:   Stress:   . Feeling of Stress :   Social Connections:   . Frequency of Communication with Friends and Family:   . Frequency of Social Gatherings with Friends and Family:   . Attends Religious Services:   . Active Member of Clubs or Organizations:   . Attends Banker Meetings:   Marland Kitchen Marital Status:   Intimate Partner Violence:   . Fear of Current or  Ex-Partner:   . Emotionally Abused:   Marland Kitchen Physically Abused:   . Sexually Abused:     Outpatient Medications Prior to Visit  Medication Sig Dispense Refill  . lisinopril (ZESTRIL) 20 MG tablet Take 1 tablet (20 mg total) by mouth daily. 30 tablet 6   No facility-administered medications prior to visit.    Allergies  Allergen Reactions  . Amoxicillin Hives    ROS Review of Systems  All other systems reviewed and are negative.     Objective:    Physical Exam CONSTITUTIONAL: Well-developed, well-nourished obsese female in no acute distress.  HENT:  Normocephalic, atraumatic, External right and left ear normal. EYES: Conjunctivae and EOM are normal. Pupils are equal, round, and reactive to light. No scleral icterus.  NECK: Normal range of motion, supple, no masses.  Normal thyroid.  SKIN: Skin is warm and dry. No rash noted. Not diaphoretic. No erythema. No pallor. NEUROLGIC: Alert and oriented to person, place, and time. Normal reflexes, muscle tone coordination. No cranial nerve deficit noted. PSYCHIATRIC: Normal mood and affect. Normal behavior. Normal judgment and thought content. CARDIOVASCULAR: Normal heart rate noted, regular rhythm RESPIRATORY: Clear to auscultation bilaterally. Effort and breath sounds normal, no problems with respiration  noted. BREASTS: Taught SBE ABDOMEN: Soft, normal bowel sounds, no distention noted.  No tenderness, rebound or guarding.  PELVIC: Normal appearing external genitalia; normal appearing vaginal mucosa and cervix.  No abnormal discharge noted.  Pap smear obtained.  Normal uterine size, no other palpable masses, no uterine or adnexal tenderness. MUSCULOSKELETAL: Normal range of motion. No tenderness.  No cyanosis, clubbing, or edema.  2+ distal pulses. BP (!) 150/91 (BP Location: Right Arm, Patient Position: Sitting, Cuff Size: Normal)   Pulse 82   Temp (!) 97.3 F (36.3 C) (Temporal)   Ht 5' 2.5" (1.588 m)   Wt 205 lb 6.4 oz (93.2 kg)    LMP 07/05/2019 (Approximate)   SpO2 97%   BMI 36.97 kg/m  Wt Readings from Last 3 Encounters:  07/14/19 205 lb 6.4 oz (93.2 kg)  05/16/19 202 lb 4 oz (91.7 kg)  04/12/19 204 lb 3.2 oz (92.6 kg)     Health Maintenance Due  Topic Date Due  . PAP SMEAR-Modifier  Never done    There are no preventive care reminders to display for this patient.  No results found for: TSH Lab Results  Component Value Date   WBC 8.2 04/12/2019   HGB 13.4 04/12/2019   HCT 40.0 04/12/2019   MCV 92.2 04/12/2019   PLT 312.0 04/12/2019   Lab Results  Component Value Date   NA 135 04/12/2019   K 3.6 04/12/2019   CO2 26 04/12/2019   GLUCOSE 122 (H) 04/12/2019   BUN 7 04/12/2019   CREATININE 0.58 04/12/2019   BILITOT 0.3 05/16/2019   ALKPHOS 108 05/16/2019   AST 26 05/16/2019   ALT 37 (H) 05/16/2019   PROT 8.0 05/16/2019   ALBUMIN 4.3 05/16/2019   CALCIUM 9.2 04/12/2019   ANIONGAP 10 03/02/2019   GFR 116.73 04/12/2019   Lab Results  Component Value Date   CHOL 218 (H) 05/16/2019   Lab Results  Component Value Date   HDL 41.20 05/16/2019   Lab Results  Component Value Date   LDLCALC 140 (H) 05/16/2019   Lab Results  Component Value Date   TRIG 184.0 (H) 05/16/2019   Lab Results  Component Value Date   CHOLHDL 5 05/16/2019   No results found for: HGBA1C    Assessment & Plan:  [place] was seen today for annual exam and gynecologic exam.  Diagnoses and all orders for this visit:  Screen for STD (sexually transmitted disease) -     Cervicovaginal ancillary only  Cervical cancer screening -     Cytology - PAP([place])  Essential hypertension Blood pressure elevated at this visit she did not take her medication . Asked what normal ranges were. Also place a BP reading on AVS. Counseled on blood pressure goal of less than 130/80, low-sodium, DASH diet, medication compliance, 150 minutes of moderate intensity exercise per week. Discussed medication compliance, adverse  effects.   No orders of the defined types were placed in this encounter.   Follow-up: Return in about 3 months (around 10/13/2019) for tele Bp.    Kerin Perna, NP

## 2019-07-15 LAB — CERVICOVAGINAL ANCILLARY ONLY
Bacterial Vaginitis (gardnerella): POSITIVE — AB
Candida Glabrata: NEGATIVE
Candida Vaginitis: NEGATIVE
Chlamydia: NEGATIVE
Comment: NEGATIVE
Comment: NEGATIVE
Comment: NEGATIVE
Comment: NEGATIVE
Comment: NEGATIVE
Comment: NORMAL
Neisseria Gonorrhea: NEGATIVE
Trichomonas: NEGATIVE

## 2019-07-19 ENCOUNTER — Other Ambulatory Visit (INDEPENDENT_AMBULATORY_CARE_PROVIDER_SITE_OTHER): Payer: Self-pay | Admitting: Primary Care

## 2019-07-19 DIAGNOSIS — R87610 Atypical squamous cells of undetermined significance on cytologic smear of cervix (ASC-US): Secondary | ICD-10-CM

## 2019-07-19 LAB — CYTOLOGY - PAP
Comment: NEGATIVE
High risk HPV: POSITIVE — AB

## 2019-07-19 MED ORDER — METRONIDAZOLE 500 MG PO TABS: 500.0000 mg | ORAL_TABLET | Freq: Two times a day (BID) | ORAL | 0 refills | Status: DC

## 2019-07-22 ENCOUNTER — Telehealth (INDEPENDENT_AMBULATORY_CARE_PROVIDER_SITE_OTHER): Payer: Self-pay

## 2019-07-22 NOTE — Telephone Encounter (Signed)
Call placed to patient using pacific interpreter [phone]) patient verified date of birth. She is aware of BV and medication being sent to pharmacy to treat. Pap abnormal. No treatment at this time, repeat in one year. She verbalized understanding. Maryjean Morn, CMA

## 2019-07-22 NOTE — Telephone Encounter (Signed)
-----   Message from Tara Sessions, NP sent at 07/21/2019 10:33 PM EDT ----- Patient has bacterial vaginosis medication sent . She has a abnormal pap which indicates appearances of abnormal cells. No treatment is recommended except follow up in 1 year for another pap instead of 3 years.

## 2019-07-26 ENCOUNTER — Telehealth (INDEPENDENT_AMBULATORY_CARE_PROVIDER_SITE_OTHER): Payer: Self-pay

## 2019-07-26 NOTE — Telephone Encounter (Signed)
Sent to PCP ?

## 2019-07-26 NOTE — Telephone Encounter (Signed)
Called patient to inform that no reaction for spotting found from flagyl.  Continue medication for BV with food twice daily until gone. If continues to spot or blood clots proceed to Ed

## 2019-07-26 NOTE — Telephone Encounter (Signed)
Patient called to inform that PCP prescribed metroNIDAZOLE (FLAGYL) 500 MG tablet Patient states she took the medication on Friday and states she has two days left of medication. Patient states last night she noticed some vaginal bleeding (spotting) and this morning when she urinated and wiped. Patient states it is not her menstrual cycle since she last had her period 2 weeks ago.   Please advice [phone]  Patient needs interpreter.

## 2019-08-03 ENCOUNTER — Telehealth (INDEPENDENT_AMBULATORY_CARE_PROVIDER_SITE_OTHER): Payer: Self-pay

## 2019-08-03 ENCOUNTER — Other Ambulatory Visit (INDEPENDENT_AMBULATORY_CARE_PROVIDER_SITE_OTHER): Payer: Self-pay | Admitting: Primary Care

## 2019-08-03 MED ORDER — FLUCONAZOLE 150 MG PO TABS: 150.0000 mg | ORAL_TABLET | Freq: Once | ORAL | 0 refills | Status: AC

## 2019-08-03 NOTE — Telephone Encounter (Signed)
Can patient receive Rx for Diflucan.

## 2019-08-03 NOTE — Telephone Encounter (Signed)
Patient states PCP prescribe a antibiotic for UTI and patient has finished her medication. Patient states it did help but she is still having itchiness and discomfort. Patient would like to know if PCP can prescribe a medication to help with these sypthoms or does she need to schedule an appointment.  Please advice [phone]

## 2019-08-04 NOTE — Telephone Encounter (Signed)
Patient is aware medication was sent to pharmacy.

## 2019-08-30 ENCOUNTER — Other Ambulatory Visit (HOSPITAL_COMMUNITY)
Admission: RE | Admit: 2019-08-30 | Discharge: 2019-08-30 | Disposition: A | Discharge status: Home / Self Care | Payer: Self-pay | Source: Ambulatory Visit | Attending: Primary Care | Admitting: Primary Care

## 2019-08-30 ENCOUNTER — Encounter (INDEPENDENT_AMBULATORY_CARE_PROVIDER_SITE_OTHER): Payer: Self-pay | Admitting: Primary Care

## 2019-08-30 ENCOUNTER — Ambulatory Visit (INDEPENDENT_AMBULATORY_CARE_PROVIDER_SITE_OTHER): Payer: Self-pay | Admitting: Primary Care

## 2019-08-30 ENCOUNTER — Other Ambulatory Visit: Payer: Self-pay

## 2019-08-30 VITALS — BP 134/86 | HR 68 | Temp 97.2°F | Ht 62.5 in | Wt 209.6 lb

## 2019-08-30 DIAGNOSIS — I10 Essential (primary) hypertension: Secondary | ICD-10-CM

## 2019-08-30 DIAGNOSIS — R300 Dysuria: Secondary | ICD-10-CM

## 2019-08-30 DIAGNOSIS — N898 Other specified noninflammatory disorders of vagina: Secondary | ICD-10-CM

## 2019-08-30 LAB — POCT URINALYSIS DIP (CLINITEK)
Bilirubin, UA: NEGATIVE
Glucose, UA: NEGATIVE mg/dL
Ketones, POC UA: NEGATIVE mg/dL
Leukocytes, UA: NEGATIVE
Nitrite, UA: NEGATIVE
POC PROTEIN,UA: 30 — AB
Spec Grav, UA: 1.025 (ref 1.010–1.025)
Urobilinogen, UA: 1 E.U./dL
pH, UA: 7.5 (ref 5.0–8.0)

## 2019-08-30 NOTE — Progress Notes (Signed)
Acute Office Visit  Subjective:    Patient ID: Tara [name], female    DOB: June 26, 1980, 39 y.o.   MRN: [id number]  Chief Complaint  Patient presents with  . Hypertension  . Vaginal Itching    and vaginal discharge with foul odor     HPI Ms. Tara [name] is a 39 year old female presents today for follow up on Blood pressure 134/86 on medication . Denies shortness of breath, headaches, chest pain or lower extremity edema, sudden onset, vision changes, unilateral weakness, dizziness, paresthesiasShe is also, complaining of vaginal itching, discharge and foul odor.  Past Medical History:  Diagnosis Date  . Elevated LFTs   . Hypertension    no meds    Past Surgical History:  Procedure Laterality Date  . CESAREAN SECTION    . THERAPEUTIC ABORTION      Family History  Problem Relation Age of Onset  . Diabetes Mother   . Hypertension Mother     Social History   Socioeconomic History  . Marital status: Single    Spouse name: Not on file  . Number of children: Not on file  . Years of education: Not on file  . Highest education level: Not on file  Occupational History  . Not on file  Tobacco Use  . Smoking status: Never Smoker  . Smokeless tobacco: Never Used  Substance and Sexual Activity  . Alcohol use: No  . Drug use: No  . Sexual activity: Yes  Other Topics Concern  . Not on file  Social History Narrative  . Not on file   Social Determinants of Health   Financial Resource Strain:   . Difficulty of Paying Living Expenses:   Food Insecurity:   . Worried About Charity fundraiser in the Last Year:   . Arboriculturist in the Last Year:   Transportation Needs:   . Film/video editor (Medical):   Marland Kitchen Lack of Transportation (Non-Medical):   Physical Activity:   . Days of Exercise per Week:   . Minutes of Exercise per Session:   Stress:   . Feeling of Stress :   Social Connections:   . Frequency of Communication with Friends and  Family:   . Frequency of Social Gatherings with Friends and Family:   . Attends Religious Services:   . Active Member of Clubs or Organizations:   . Attends Archivist Meetings:   Marland Kitchen Marital Status:   Intimate Partner Violence:   . Fear of Current or Ex-Partner:   . Emotionally Abused:   Marland Kitchen Physically Abused:   . Sexually Abused:     Outpatient Medications Prior to Visit  Medication Sig Dispense Refill  . lisinopril (ZESTRIL) 20 MG tablet Take 1 tablet (20 mg total) by mouth daily. 30 tablet 6  . metroNIDAZOLE (FLAGYL) 500 MG tablet Take 1 tablet (500 mg total) by mouth 2 (two) times daily. 14 tablet 0   No facility-administered medications prior to visit.    Allergies  Allergen Reactions  . Amoxicillin Hives    Review of Systems  Genitourinary: Positive for dysuria, urgency, vaginal discharge and vaginal pain.  All other systems reviewed and are negative.      Objective:    Physical Exam Vitals reviewed.  Constitutional:      Appearance: She is obese.  HENT:     Right Ear: Tympanic membrane normal.     Left Ear: Tympanic membrane normal.  Cardiovascular:     Rate  and Rhythm: Normal rate and regular rhythm.  Pulmonary:     Effort: Pulmonary effort is normal.     Breath sounds: Normal breath sounds.  Abdominal:     General: Bowel sounds are normal.  Genitourinary:    Comments: Vaginal discharge  Musculoskeletal:        General: Normal range of motion.     Cervical back: Normal range of motion and neck supple.  Skin:    General: Skin is warm and dry.  Neurological:     Mental Status: She is alert and oriented to person, place, and time.  Psychiatric:        Mood and Affect: Mood normal.        Behavior: Behavior normal.        Thought Content: Thought content normal.        Judgment: Judgment normal.     BP 134/86 (BP Location: Right Arm, Patient Position: Sitting, Cuff Size: Large)   Pulse 68   Temp (!) 97.2 F (36.2 C) (Temporal)   Ht 5'  2.5" (1.588 m)   Wt 209 lb 9.6 oz (95.1 kg)   SpO2 98%   BMI 37.73 kg/m  Wt Readings from Last 3 Encounters:  08/30/19 209 lb 9.6 oz (95.1 kg)  07/14/19 205 lb 6.4 oz (93.2 kg)  05/16/19 202 lb 4 oz (91.7 kg)    Health Maintenance Due  Topic Date Due  . COVID-19 Vaccine (1) Never done    There are no preventive care reminders to display for this patient.   No results found for: TSH Lab Results  Component Value Date   WBC 8.2 04/12/2019   HGB 13.4 04/12/2019   HCT 40.0 04/12/2019   MCV 92.2 04/12/2019   PLT 312.0 04/12/2019   Lab Results  Component Value Date   NA 135 04/12/2019   K 3.6 04/12/2019   CO2 26 04/12/2019   GLUCOSE 122 (H) 04/12/2019   BUN 7 04/12/2019   CREATININE 0.58 04/12/2019   BILITOT 0.3 05/16/2019   ALKPHOS 108 05/16/2019   AST 26 05/16/2019   ALT 37 (H) 05/16/2019   PROT 8.0 05/16/2019   ALBUMIN 4.3 05/16/2019   CALCIUM 9.2 04/12/2019   ANIONGAP 10 03/02/2019   GFR 116.73 04/12/2019   Lab Results  Component Value Date   CHOL 218 (H) 05/16/2019   Lab Results  Component Value Date   HDL 41.20 05/16/2019   Lab Results  Component Value Date   LDLCALC 140 (H) 05/16/2019   Lab Results  Component Value Date   TRIG 184.0 (H) 05/16/2019   Lab Results  Component Value Date   CHOLHDL 5 05/16/2019   No results found for: HGBA1C     Assessment & Plan:  Tara [name] was seen today for hypertension and vaginal itching.  Diagnoses and all orders for this visit:  Vaginal discharge/Dysuria -     POCT URINALYSIS DIP (CLINITEK) -     Cervicovaginal ancillary only  Essential hypertension Counseled on blood pressure goal of less than 130/80, low-sodium, DASH diet, medication compliance, 150 minutes of moderate intensity exercise per week. Discussed medication compliance, adverse effects.Continue  lisinopril (ZESTRIL) 20 MG tablet; Take 1 tablet (20 mg total) by mouth daily Blood pressure readings at home systolic 130-140 and diastolic  80-90   No orders of the defined types were placed in this encounter.    Grayce Sessions, NP

## 2019-08-30 NOTE — Patient Instructions (Signed)
Hipertensión en los adultos °Hypertension, Adult °El término hipertensión es otra forma de denominar a la presión arterial elevada. La presión arterial elevada fuerza al corazón a trabajar más para bombear la sangre. Esto puede causar problemas con el paso del tiempo. °Una lectura de presión arterial está compuesta por 2 números. Hay un número superior (sistólico) sobre un número inferior (diastólico). Lo ideal es tener la presión arterial por debajo de 120/80. Las elecciones saludables pueden ayudar a bajar la presión arterial, o tal vez necesite medicamentos para bajarla. °¿Cuáles son las causas? °Se desconoce la causa de esta afección. Algunas afecciones pueden estar relacionadas con la presión arterial alta. °¿Qué incrementa el riesgo? °· Fumar. °· Tener diabetes mellitus tipo 2, colesterol alto, o ambos. °· No hacer la cantidad suficiente de actividad física o ejercicio. °· Tener sobrepeso. °· Consumir mucha grasa, azúcar, calorías o sal (sodio) en su dieta. °· Beber alcohol en exceso. °· Tener una enfermedad renal a largo plazo (crónica). °· Tener antecedentes familiares de presión arterial alta. °· Edad. Los riesgos aumentan con la edad. °· Raza. El riesgo es mayor para las personas afroamericanas. °· Sexo. Antes de los 45 años, los hombres corren más riesgo que las mujeres. Después de los 65 años, las mujeres corren más riesgo que los hombres. °· Tener apnea obstructiva del sueño. °· Estrés. °¿Cuáles son los signos o los síntomas? °· Es posible que la presión arterial alta puede no cause síntomas. La presión arterial muy alta (crisis hipertensiva) puede provocar: °? Dolor de cabeza. °? Sensaciones de preocupación o nerviosismo (ansiedad). °? Falta de aire. °? Hemorragia nasal. °? Sensación de malestar en el estómago (náuseas). °? Vómitos. °? Cambios en la forma de ver. °? Dolor muy intenso en el pecho. °? Convulsiones. °¿Cómo se trata? °· Esta afección se trata haciendo cambios saludables en el estilo de  vida, por ejemplo: °? Consumir alimentos saludables. °? Hacer más ejercicio. °? Beber menos alcohol. °· El médico puede recetarle medicamentos si los cambios en el estilo de vida no son suficientes para lograr controlar la presión arterial y si: °? El número de arriba está por encima de 130. °? El número de abajo está por encima de 80. °· Su presión arterial personal ideal puede variar. °Siga estas instrucciones en su casa: °Comida y bebida ° °· Si se lo dicen, siga el plan de alimentación de DASH (Dietary Approaches to Stop Hypertension, Maneras de alimentarse para detener la hipertensión). Para seguir este plan: °? Llene la mitad del plato de cada comida con frutas y verduras. °? Llene un cuarto del plato de cada comida con cereales integrales. Los cereales integrales incluyen pasta integral, arroz integral y pan integral. °? Coma y beba productos lácteos con bajo contenido de grasa, como leche descremada o yogur bajo en grasas. °? Llene un cuarto del plato de cada comida con proteínas bajas en grasa (magras). Las proteínas bajas en grasa incluyen pescado, pollo sin piel, huevos, frijoles y tofu. °? Evite consumir carne grasa, carne curada y procesada, o pollo con piel. °? Evite consumir alimentos prehechos o procesados. °· Consuma menos de 1500 mg de sal por día. °· No beba alcohol si: °? El médico le indica que no lo haga. °? Está embarazada, puede estar embarazada o está tratando de quedar embarazada. °· Si bebe alcohol: °? Limite la cantidad que bebe a lo siguiente: °§ De 0 a 1 medida por día para las mujeres. °§ De 0 a 2 medidas por día para los hombres. °? Esté atento a   la cantidad de alcohol que hay en las bebidas que toma. En [place], una medida equivale a una botella de cerveza de 12 oz (355 ml), un vaso de vino de 5 oz (148 ml) o un vaso de una bebida alcohólica de alta graduación de 1½ oz (44 ml). °Estilo de vida ° °· Trabaje con su médico para mantenerse en un peso saludable o para perder  peso. Pregúntele a su médico cuál es el peso recomendable para usted. °· Haga al menos 30 minutos de ejercicio la mayoría de los días de la semana. Estos pueden incluir caminar, nadar o andar en bicicleta. °· Realice al menos 30 minutos de ejercicio que fortalezca sus músculos (ejercicios de resistencia) al menos 3 días a la semana. Estos pueden incluir levantar pesas o hacer Pilates. °· No consuma ningún producto que contenga nicotina o tabaco, como cigarrillos, cigarrillos electrónicos y tabaco de mascar. Si necesita ayuda para dejar de fumar, consulte al médico. °· Controle su presión arterial en su casa tal como le indicó el médico. °· Concurra a todas las visitas de seguimiento como se lo haya indicado el médico. Esto es importante. °Medicamentos °· Tome los medicamentos de venta libre y los recetados solamente como se lo haya indicado el médico. Siga cuidadosamente las indicaciones. °· No omita las dosis de medicamentos para la presión arterial. Los medicamentos pierden eficacia si omite dosis. El hecho de omitir las dosis también aumenta el riesgo de otros problemas. °· Pregúntele a su médico a qué efectos secundarios o reacciones a los medicamentos debe prestar atención. °Comuníquese con un médico si: °· Piensa que tiene una reacción a los medicamentos que está tomando. °· Tiene dolores de cabeza frecuentes (recurrentes). °· Se siente mareado. °· Tiene hinchazón en los tobillos. °· Tiene problemas de visión. °Solicite ayuda inmediatamente si: °· Siente un dolor de cabeza muy intenso. °· Empieza a sentirse desorientado (confundido). °· Se siente débil o adormecido. °· Siente que va a desmayarse. °· Tiene un dolor muy intenso en las siguientes zonas: °? Pecho. °? Vientre (abdomen). °· Vomita más de una vez. °· Tiene dificultad para respirar. °Resumen °· El término hipertensión es otra forma de denominar a la presión arterial elevada. °· La presión arterial elevada fuerza al corazón a trabajar más para bombear  la sangre. °· Para la mayoría de las personas, una presión arterial normal es menor que 120/80. °· Las decisiones saludables pueden ayudarle a disminuir su presión arterial. Si no puede bajar su presión arterial mediante decisiones saludables, es posible que deba tomar medicamentos. °Esta información no tiene como fin reemplazar el consejo del médico. Asegúrese de hacerle al médico cualquier pregunta que tenga. °Document Revised: 01/14/2018 Document Reviewed: 01/14/2018 °Elsevier Patient Education © 2020 [employer] ° °

## 2019-08-31 LAB — CERVICOVAGINAL ANCILLARY ONLY
Bacterial Vaginitis (gardnerella): POSITIVE — AB
Candida Glabrata: NEGATIVE
Candida Vaginitis: POSITIVE — AB
Chlamydia: NEGATIVE
Comment: NEGATIVE
Comment: NEGATIVE
Comment: NEGATIVE
Comment: NEGATIVE
Comment: NEGATIVE
Comment: NORMAL
Neisseria Gonorrhea: NEGATIVE
Trichomonas: NEGATIVE

## 2019-09-01 ENCOUNTER — Other Ambulatory Visit (INDEPENDENT_AMBULATORY_CARE_PROVIDER_SITE_OTHER): Payer: Self-pay | Admitting: Primary Care

## 2019-09-01 DIAGNOSIS — B3731 Acute candidiasis of vulva and vagina: Secondary | ICD-10-CM

## 2019-09-01 DIAGNOSIS — B9689 Other specified bacterial agents as the cause of diseases classified elsewhere: Secondary | ICD-10-CM

## 2019-09-01 MED ORDER — METRONIDAZOLE 500 MG PO TABS: 500.0000 mg | ORAL_TABLET | Freq: Two times a day (BID) | ORAL | 0 refills | Status: DC

## 2019-09-01 MED ORDER — FLUCONAZOLE 150 MG PO TABS: 150.0000 mg | ORAL_TABLET | Freq: Once | ORAL | 0 refills | Status: AC

## 2019-09-02 ENCOUNTER — Telehealth (INDEPENDENT_AMBULATORY_CARE_PROVIDER_SITE_OTHER): Payer: Self-pay

## 2019-09-02 NOTE — Telephone Encounter (Signed)
-----   Message from Grayce Sessions, NP sent at 09/01/2019  3:22 PM EDT ----- Results Bacterial vaginitis and Candida sent A prescription for metronidazole. You take this medication twice a day for 7 days and diflucan 150mg

## 2019-09-02 NOTE — Telephone Encounter (Signed)
Call placed to patient using pacific interpreter [phone]) patient verified date of birth. She is aware of BV and yeast infection. Instructed patient on how to properly take medications prescribed to treat both. She verbalized understanding.  Maryjean Morn, CMA

## 2019-10-13 ENCOUNTER — Ambulatory Visit (INDEPENDENT_AMBULATORY_CARE_PROVIDER_SITE_OTHER): Payer: Self-pay | Admitting: Primary Care

## 2019-11-01 ENCOUNTER — Ambulatory Visit
Admission: EM | Admit: 2019-11-01 | Discharge: 2019-11-01 | Disposition: A | Discharge status: Home / Self Care | Payer: Self-pay | Attending: Physician Assistant | Admitting: Physician Assistant

## 2019-11-01 DIAGNOSIS — N898 Other specified noninflammatory disorders of vagina: Secondary | ICD-10-CM

## 2019-11-01 DIAGNOSIS — R21 Rash and other nonspecific skin eruption: Secondary | ICD-10-CM

## 2019-11-01 LAB — POCT URINALYSIS DIP (MANUAL ENTRY)
Bilirubin, UA: NEGATIVE
Glucose, UA: NEGATIVE mg/dL
Ketones, POC UA: NEGATIVE mg/dL
Leukocytes, UA: NEGATIVE
Nitrite, UA: NEGATIVE
Protein Ur, POC: 100 mg/dL — AB
Spec Grav, UA: 1.025 (ref 1.010–1.025)
Urobilinogen, UA: 0.2 E.U./dL
pH, UA: 6.5 (ref 5.0–8.0)

## 2019-11-01 MED ORDER — FLUCONAZOLE 150 MG PO TABS: 150.0000 mg | ORAL_TABLET | Freq: Every day | ORAL | 0 refills | Status: DC

## 2019-11-01 MED ORDER — CLOTRIMAZOLE-BETAMETHASONE 1-0.05 % EX CREA: TOPICAL_CREAM | CUTANEOUS | 0 refills | Status: DC

## 2019-11-01 NOTE — ED Triage Notes (Signed)
Patient is here today with complaints of bumps under her left arm that she noticed about three days ago. Patient states the bumps are itchy and red.   Patient is here today with complaints of hematuria, polyuria, burning that began about a week ago.

## 2019-11-01 NOTE — Discharge Instructions (Signed)
Start diflucan as directed. Lotrisone to the underarm. Do not use soap to the areas for now. Follow up with PCP if symptoms not improving. If having abdominal pain, fever, nausea/vomiting, follow up for reevaluation.

## 2019-11-01 NOTE — ED Provider Notes (Signed)
[name]    CSN: [id number] Arrival date & time: 11/01/19  1745      History   Chief Complaint Chief Complaint  Patient presents with   Mass   Dysuria   Hematuria    HPI Tara [name] is a 39 y.o. female.   39 year old female comes in for multiple complaints. HPI obtained by patient through video translator.   1. Rash x 3 days Starting having itching to the left arm with rash. Denies any spreading. No pain. Denies bug bites, fevers. No new exposures.   2. Urinary symptoms x 1 week Hematuria, urinary frequency, dysuria. Denies abdominal pain, nausea, vomiting. Denies fever, chills, flank/back pain. Vaginal itching, no discharge. Denies vaginal bleeding. New soap. LMP 10/25/2019     Past Medical History:  Diagnosis Date   Elevated LFTs    Hypertension    no meds    Patient Active Problem List   Diagnosis Date Noted   Fatty liver 05/16/2019   Encounter for IUD removal 12/08/2016   Contraception management 06/13/2016   Elevated blood pressure reading 06/13/2016   Preeclampsia, third trimester 03/25/2016   GERD (gastroesophageal reflux disease) 03/05/2016   Previous cesarean delivery, antepartum condition or complication 02/04/2016   Encounter for supervision of normal pregnancy 11/27/2015    Past Surgical History:  Procedure Laterality Date   CESAREAN SECTION     THERAPEUTIC ABORTION      OB History    Gravida  4   Para  3   Term  3   Preterm  0   AB  1   Living  3     SAB  0   TAB  1   Ectopic  0   Multiple  0   Live Births  3            Home Medications    Prior to Admission medications   Medication Sig Start Date End Date Taking? Authorizing Provider  clotrimazole-betamethasone (LOTRISONE) cream Apply to affected area 2 times daily prn 11/01/19   Cathie Hoops, Onetha Gaffey V, PA-C  fluconazole (DIFLUCAN) 150 MG tablet Take 1 tablet (150 mg total) by mouth daily. Take second dose 72 hours later if symptoms  still persists. 11/01/19   Cathie Hoops, Viyaan Champine V, PA-C  lisinopril (ZESTRIL) 20 MG tablet Take 1 tablet (20 mg total) by mouth daily. 06/30/19   Hoy Register, MD    Family History Family History  Problem Relation Age of Onset   Diabetes Mother    Hypertension Mother     Social History Social History   Tobacco Use   Smoking status: Never Smoker   Smokeless tobacco: Never Used  Building services engineer Use: Never used  Substance Use Topics   Alcohol use: No   Drug use: No     Allergies   Amoxicillin   Review of Systems Review of Systems  Reason unable to perform ROS: See HPI as above.     Physical Exam Triage Vital Signs ED Triage Vitals  Enc Vitals Group     BP 11/01/19 1941 139/89     Pulse Rate 11/01/19 1941 75     Resp 11/01/19 1941 16     Temp 11/01/19 1941 98.3 F (36.8 C)     Temp Source 11/01/19 1941 Oral     SpO2 11/01/19 1941 98 %     Weight --      Height --      Head Circumference --  Peak Flow --      Pain Score 11/01/19 1944 0     Pain Loc --      Pain Edu? --      Excl. in GC? --    No data found.  Updated Vital Signs BP 139/89 (BP Location: Left Arm)    Pulse 75    Temp 98.3 F (36.8 C) (Oral)    Resp 16    LMP 10/25/2019 (Exact Date)    SpO2 98%   Physical Exam Constitutional:      General: She is not in acute distress.    Appearance: Normal appearance. She is well-developed. She is not toxic-appearing or diaphoretic.  HENT:     Head: Normocephalic and atraumatic.  Eyes:     Conjunctiva/sclera: Conjunctivae normal.     Pupils: Pupils are equal, round, and reactive to light.  Pulmonary:     Effort: Pulmonary effort is normal. No respiratory distress.  Musculoskeletal:     Cervical back: Normal range of motion and neck supple.  Skin:    General: Skin is warm and dry.     Comments: Left axilla with skin peeling, erythema, no warmth.  No tenderness to palpation.  Neurological:     Mental Status: She is alert and oriented to person,  place, and time.      UC Treatments / Results  Labs (all labs ordered are listed, but only abnormal results are displayed) Labs Reviewed  POCT URINALYSIS DIP (MANUAL ENTRY) - Abnormal; Notable for the following components:      Result Value   Blood, UA large (*)    Protein Ur, POC =100 (*)    All other components within normal limits    EKG   Radiology No results found.  Procedures Procedures (including critical care time)  Medications Ordered in UC Medications - No data to display  Initial Impression / Assessment and Plan / UC Course  I have reviewed the triage vital signs and the nursing notes.  Pertinent labs & imaging results that were available during my care of the patient were reviewed by me and considered in my medical decision making (see chart for details).    ? Yeast causing symptoms given new hygiene product change.  Diflucan as directed.  Lotrisone to axilla as needed.  Return precautions given.  Final Clinical Impressions(s) / UC Diagnoses   Final diagnoses:  Rash and nonspecific skin eruption  Vaginal irritation   ED Prescriptions    Medication Sig Dispense Auth. Provider   fluconazole (DIFLUCAN) 150 MG tablet Take 1 tablet (150 mg total) by mouth daily. Take second dose 72 hours later if symptoms still persists. 2 tablet Daralyn Bert V, PA-C   clotrimazole-betamethasone (LOTRISONE) cream Apply to affected area 2 times daily prn 15 g Belinda Fisher, PA-C     PDMP not reviewed this encounter.   Belinda Fisher, PA-C 11/01/19 2021

## 2020-01-26 ENCOUNTER — Ambulatory Visit (INDEPENDENT_AMBULATORY_CARE_PROVIDER_SITE_OTHER): Payer: Self-pay | Admitting: Primary Care

## 2020-03-05 ENCOUNTER — Encounter (INDEPENDENT_AMBULATORY_CARE_PROVIDER_SITE_OTHER): Payer: Self-pay | Admitting: Primary Care

## 2020-03-05 ENCOUNTER — Other Ambulatory Visit (HOSPITAL_COMMUNITY)
Admission: RE | Admit: 2020-03-05 | Discharge: 2020-03-05 | Disposition: A | Discharge status: Home / Self Care | Payer: Self-pay | Source: Ambulatory Visit | Attending: Primary Care | Admitting: Primary Care

## 2020-03-05 ENCOUNTER — Other Ambulatory Visit: Payer: Self-pay

## 2020-03-05 ENCOUNTER — Ambulatory Visit (INDEPENDENT_AMBULATORY_CARE_PROVIDER_SITE_OTHER): Payer: Self-pay | Admitting: Primary Care

## 2020-03-05 VITALS — BP 145/83 | HR 79 | Temp 97.5°F | Ht 62.5 in | Wt 220.2 lb

## 2020-03-05 DIAGNOSIS — I10 Essential (primary) hypertension: Secondary | ICD-10-CM

## 2020-03-05 DIAGNOSIS — N898 Other specified noninflammatory disorders of vagina: Secondary | ICD-10-CM | POA: Insufficient documentation

## 2020-03-05 DIAGNOSIS — Z760 Encounter for issue of repeat prescription: Secondary | ICD-10-CM

## 2020-03-05 MED ORDER — LISINOPRIL 20 MG PO TABS: 20.0000 mg | ORAL_TABLET | Freq: Every day | ORAL | 1 refills | Status: DC

## 2020-03-05 NOTE — Progress Notes (Signed)
Established Patient Office Visit  Subjective:  Patient ID: Tara [name], female    DOB: [birth year]  Age: 38 y.o. MRN: [id number]  CC:  Chief Complaint  Patient presents with  . Hypertension  . Medication Refill  . Vaginal Discharge  . vaginal odor    HPI [name] is a 39 year old obese [name] Tara [name] who presents for blood pressure follow-up. Blood pressure remains elevated 145/83. She has been out of Bp meds for 4 days. Denies shortness of breath, headaches, chest pain or lower extremity edema. She Ms.   Patient complains of an abnormal vaginal discharge for 1 month. Discharge described as: scant and odor .  Past Medical History:  Diagnosis Date  . Elevated LFTs   . Hypertension    no meds    Past Surgical History:  Procedure Laterality Date  . CESAREAN SECTION    . THERAPEUTIC ABORTION      Family History  Problem Relation Age of Onset  . Diabetes Mother   . Hypertension Mother     Social History   Socioeconomic History  . Marital status: Single    Spouse name: Not on file  . Number of children: Not on file  . Years of education: Not on file  . Highest education level: Not on file  Occupational History  . Not on file  Tobacco Use  . Smoking status: Never Smoker  . Smokeless tobacco: Never Used  Vaping Use  . Vaping Use: Never used  Substance and Sexual Activity  . Alcohol use: No  . Drug use: No  . Sexual activity: Yes  Other Topics Concern  . Not on file  Social History Narrative  . Not on file   Social Determinants of Health   Financial Resource Strain:   . Difficulty of Paying Living Expenses: Not on file  Food Insecurity:   . Worried About Programme researcher, broadcasting/film/video in the Last Year: Not on file  . Ran Out of Food in the Last Year: Not on file  Transportation Needs:   . Lack of Transportation (Medical): Not on file  . Lack of Transportation (Non-Medical): Not on file  Physical Activity:    . Days of Exercise per Week: Not on file  . Minutes of Exercise per Session: Not on file  Stress:   . Feeling of Stress : Not on file  Social Connections:   . Frequency of Communication with Friends and Family: Not on file  . Frequency of Social Gatherings with Friends and Family: Not on file  . Attends Religious Services: Not on file  . Active Member of Clubs or Organizations: Not on file  . Attends Banker Meetings: Not on file  . Marital Status: Not on file  Intimate Partner Violence:   . Fear of Current or Ex-Partner: Not on file  . Emotionally Abused: Not on file  . Physically Abused: Not on file  . Sexually Abused: Not on file    Outpatient Medications Prior to Visit  Medication Sig Dispense Refill  . clotrimazole-betamethasone (LOTRISONE) cream Apply to affected area 2 times daily prn 15 g 0  . lisinopril (ZESTRIL) 20 MG tablet Take 1 tablet (20 mg total) by mouth daily. 30 tablet 6  . fluconazole (DIFLUCAN) 150 MG tablet Take 1 tablet (150 mg total) by mouth daily. Take second dose 72 hours later if symptoms still persists. 2 tablet 0   No facility-administered medications prior to visit.    Allergies  Allergen Reactions  . Amoxicillin Hives    ROS Review of Systems  Genitourinary: Positive for vaginal discharge.       Odor   All other systems reviewed and are negative.     Objective:    Physical Exam Vitals reviewed.  Constitutional:      Appearance: She is obese.  HENT:     Head: Normocephalic.     Nose: Nose normal.  Cardiovascular:     Rate and Rhythm: Normal rate and regular rhythm.  Pulmonary:     Effort: Pulmonary effort is normal.     Breath sounds: Normal breath sounds.  Abdominal:     General: Bowel sounds are normal. There is distension.     Palpations: Abdomen is soft.  Musculoskeletal:        General: Normal range of motion.     Cervical back: Normal range of motion and neck supple.  Skin:    General: Skin is warm and  dry.  Neurological:     Mental Status: She is alert and oriented to person, place, and time.  Psychiatric:        Mood and Affect: Mood normal.        Behavior: Behavior normal.        Thought Content: Thought content normal.        Judgment: Judgment normal.     BP (!) 145/83 (BP Location: Right Arm, Patient Position: Sitting, Cuff Size: Large)   Pulse 79   Temp (!) 97.5 F (36.4 C) (Temporal)   Ht 5' 2.5" (1.588 m)   Wt 220 lb 3.2 oz (99.9 kg)   LMP 02/13/2020 (Approximate)   SpO2 95%   BMI 39.63 kg/m  Wt Readings from Last 3 Encounters:  03/05/20 220 lb 3.2 oz (99.9 kg)  08/30/19 209 lb 9.6 oz (95.1 kg)  07/14/19 205 lb 6.4 oz (93.2 kg)     Health Maintenance Due  Topic Date Due  . COVID-19 Vaccine (1) Never done    There are no preventive care reminders to display for this patient.  No results found for: TSH Lab Results  Component Value Date   WBC 8.2 04/12/2019   HGB 13.4 04/12/2019   HCT 40.0 04/12/2019   MCV 92.2 04/12/2019   PLT 312.0 04/12/2019   Lab Results  Component Value Date   NA 135 04/12/2019   K 3.6 04/12/2019   CO2 26 04/12/2019   GLUCOSE 122 (H) 04/12/2019   BUN 7 04/12/2019   CREATININE 0.58 04/12/2019   BILITOT 0.3 05/16/2019   ALKPHOS 108 05/16/2019   AST 26 05/16/2019   ALT 37 (H) 05/16/2019   PROT 8.0 05/16/2019   ALBUMIN 4.3 05/16/2019   CALCIUM 9.2 04/12/2019   ANIONGAP 10 03/02/2019   GFR 116.73 04/12/2019   Lab Results  Component Value Date   CHOL 218 (H) 05/16/2019   Lab Results  Component Value Date   HDL 41.20 05/16/2019   Lab Results  Component Value Date   LDLCALC 140 (H) 05/16/2019   Lab Results  Component Value Date   TRIG 184.0 (H) 05/16/2019   Lab Results  Component Value Date   CHOLHDL 5 05/16/2019   No results found for: HGBA1C    Assessment & Plan:  Tara [name] was seen today for hypertension, medication refill, vaginal discharge and vaginal odor.  Diagnoses and all orders for this  visit:  Essential hypertension Counseled on blood pressure goal of less than 130/80, low-sodium, DASH diet, medication compliance, 150 minutes  of moderate intensity exercise per week. Discussed medication compliance, adverse effects. -     lisinopril (ZESTRIL) 20 MG tablet; Take 1 tablet (20 mg total) by mouth daily.  Vaginal discharge -     Cervicovaginal ancillary only  Medication refill -     lisinopril (ZESTRIL) 20 MG tablet; Take 1 tablet (20 mg total) by mouth daily.    Meds ordered this encounter  Medications  . lisinopril (ZESTRIL) 20 MG tablet    Sig: Take 1 tablet (20 mg total) by mouth daily.    Dispense:  90 tablet    Refill:  1  Per patient received [facility] at [facility] both doses she will bring in her card at next visit   Follow-up: Return in about 3 months (around 06/05/2020) for Bp follow up .    Grayce Sessions, NP

## 2020-03-05 NOTE — Patient Instructions (Signed)

## 2020-03-06 LAB — CERVICOVAGINAL ANCILLARY ONLY
Bacterial Vaginitis (gardnerella): POSITIVE — AB
Candida Glabrata: NEGATIVE
Candida Vaginitis: NEGATIVE
Chlamydia: NEGATIVE
Comment: NEGATIVE
Comment: NEGATIVE
Comment: NEGATIVE
Comment: NEGATIVE
Comment: NEGATIVE
Comment: NORMAL
Neisseria Gonorrhea: NEGATIVE
Trichomonas: NEGATIVE

## 2020-03-07 ENCOUNTER — Other Ambulatory Visit (INDEPENDENT_AMBULATORY_CARE_PROVIDER_SITE_OTHER): Payer: Self-pay | Admitting: Primary Care

## 2020-03-07 DIAGNOSIS — N760 Acute vaginitis: Secondary | ICD-10-CM

## 2020-03-07 DIAGNOSIS — B9689 Other specified bacterial agents as the cause of diseases classified elsewhere: Secondary | ICD-10-CM

## 2020-03-07 MED ORDER — METRONIDAZOLE 500 MG PO TABS: 500.0000 mg | ORAL_TABLET | Freq: Two times a day (BID) | ORAL | 0 refills | Status: DC

## 2020-03-14 ENCOUNTER — Encounter (INDEPENDENT_AMBULATORY_CARE_PROVIDER_SITE_OTHER): Payer: Self-pay

## 2020-06-05 ENCOUNTER — Ambulatory Visit (INDEPENDENT_AMBULATORY_CARE_PROVIDER_SITE_OTHER): Payer: Self-pay | Admitting: Primary Care

## 2020-09-24 ENCOUNTER — Other Ambulatory Visit (INDEPENDENT_AMBULATORY_CARE_PROVIDER_SITE_OTHER): Payer: Self-pay | Admitting: Primary Care

## 2020-09-24 DIAGNOSIS — Z760 Encounter for issue of repeat prescription: Secondary | ICD-10-CM

## 2020-09-24 DIAGNOSIS — I10 Essential (primary) hypertension: Secondary | ICD-10-CM

## 2020-09-24 NOTE — Telephone Encounter (Signed)
Courtesy refill. Patient needs to be seen for additional refills. Requested Prescriptions  Pending Prescriptions Disp Refills  . lisinopril (ZESTRIL) 20 MG tablet [Pharmacy Med Name: Lisinopril 20 MG Oral Tablet] 30 tablet 0    Sig: Take 1 tablet by mouth once daily     Cardiovascular:  ACE Inhibitors Failed - 09/24/2020  3:44 PM      Failed - Cr in normal range and within 180 days    Creat  Date Value Ref Range Status  03/12/2016 0.51 0.50 - 1.10 mg/dL Final   Creatinine, Ser  Date Value Ref Range Status  04/12/2019 0.58 0.40 - 1.20 mg/dL Final   Creatinine, Urine  Date Value Ref Range Status  03/25/2016 51.00 mg/dL Final         Failed - K in normal range and within 180 days    Potassium  Date Value Ref Range Status  04/12/2019 3.6 3.5 - 5.1 mEq/L Final         Failed - Last BP in normal range    BP Readings from Last 1 Encounters:  03/05/20 (!) 145/83         Failed - Valid encounter within last 6 months    Recent Outpatient Visits          6 months ago Essential hypertension   [facility] Grayce Sessions, NP   1 year ago Vaginal discharge   [facility] Grayce Sessions, NP   1 year ago Screen for STD (sexually transmitted disease)   [facility] Grayce Sessions, NP   1 year ago Essential hypertension   [facility] Hoy Register, MD             Passed - Patient is not pregnant

## 2020-09-26 ENCOUNTER — Encounter (INDEPENDENT_AMBULATORY_CARE_PROVIDER_SITE_OTHER): Payer: Self-pay | Admitting: Primary Care

## 2020-09-26 ENCOUNTER — Other Ambulatory Visit: Payer: Self-pay

## 2020-09-26 ENCOUNTER — Other Ambulatory Visit (INDEPENDENT_AMBULATORY_CARE_PROVIDER_SITE_OTHER): Payer: Self-pay | Admitting: Primary Care

## 2020-09-26 ENCOUNTER — Ambulatory Visit (INDEPENDENT_AMBULATORY_CARE_PROVIDER_SITE_OTHER): Payer: Self-pay | Admitting: Primary Care

## 2020-09-26 ENCOUNTER — Other Ambulatory Visit (HOSPITAL_COMMUNITY)
Admission: RE | Admit: 2020-09-26 | Discharge: 2020-09-26 | Disposition: A | Discharge status: Home / Self Care | Payer: Self-pay | Source: Ambulatory Visit | Attending: Primary Care | Admitting: Primary Care

## 2020-09-26 VITALS — BP 116/73 | HR 95 | Temp 98.1°F

## 2020-09-26 DIAGNOSIS — Z760 Encounter for issue of repeat prescription: Secondary | ICD-10-CM

## 2020-09-26 DIAGNOSIS — N760 Acute vaginitis: Secondary | ICD-10-CM

## 2020-09-26 DIAGNOSIS — E6609 Other obesity due to excess calories: Secondary | ICD-10-CM

## 2020-09-26 DIAGNOSIS — N898 Other specified noninflammatory disorders of vagina: Secondary | ICD-10-CM

## 2020-09-26 DIAGNOSIS — I10 Essential (primary) hypertension: Secondary | ICD-10-CM

## 2020-09-26 MED ORDER — LISINOPRIL 20 MG PO TABS: 1.0000 | ORAL_TABLET | Freq: Every day | ORAL | 1 refills | Status: DC

## 2020-09-26 NOTE — Patient Instructions (Signed)
Obesidad en los adultos Obesity, Adult La obesidad es un exceso de grasa corporal. Ser obeso significa que su peso esms de lo que es saludable para usted. El IMC es un nmero que indica la cantidad de grasa corporal que tiene una persona. Si usted tiene un ndice de masa corporal (IMC) de 30 o ms, esto significa que es obeso. A menudo, la causa de la obesidad es comer o beber ms caloras que las que el cuerpo usa. Cambiar el estilo de vida puede ayudarlo abajar de peso. La obesidad puede causar problemas de salud graves, como los siguientes: Accidente cerebrovascular. Arteriopata coronaria (EAC). Diabetes tipo 2. Algunos tipos de cncer, incluido el cncer de colon, mama, tero y vescula. Artrosis. Presin arterial alta (hipertensin arterial). Colesterol alto. Apnea del sueo. Clculos en la vescula. Problemas de esterilidad. Cules son las causas? Consumir todos los das alimentos con altos niveles de caloras, azcar y grasa. Nacer con genes que pueden hacerlo ms propenso a ser obeso. Tener una afeccin mdica que causa obesidad. Tomar ciertos medicamentos. Permanecer mucho tiempo sentado (tener un estilo de vida sedentario). No dormir lo suficiente. Beber gran cantidad de bebidas con azcar. Qu incrementa el riesgo? Tener antecedentes familiares de obesidad. Ser mujer afroamericana. Ser hombre de origen hispano. Vivir en un rea con acceso limitado a las siguientes posibilidades: Parques, centros recreativos o veredas. Alimentos saludables, como se venden en tiendas de comestibles y mercados de agricultores. Cules son los signos o los sntomas? El principal signo es tener demasiada grasa corporal. Cmo se trata? El tratamiento de esta afeccin frecuentemente incluye cambiar el estilo de vida. El tratamiento puede incluir: Cambios en la dieta. Esto puede incluir crear un plan de alimentacin saludable. Realizar actividad fsica. Puede incluir una actividad que hace  que el corazn lata ms rpido (ejercicio aerbico) y entrenamiento de fuerza. Trabaje con su mdico para disear un programa que funcione para usted. Medicamentos para ayudarlo a perder peso. Pueden utilizarse si no puede perder 1 libra (450 g) por semana despus de 6 semanas de alimentacin saludable y ms ejercicio. Tratar las afecciones que causan la obesidad. Ciruga. Las opciones pueden incluir bandas gstricas y bypass gstrico. Esto puede realizarse en las siguientes situaciones: Otros tratamientos no mejoraron su afeccin. Tiene un IMC de 40 o superior. Tiene problemas de salud potencialmente mortales relacionados con la obesidad. Siga estas instrucciones en su casa: Comida y bebida  Siga las instrucciones del mdico respecto de las comidas y las bebidas. Su mdico puede recomendarle lo siguiente: Limitar las comidas rpidas, los dulces y las colaciones procesadas. Elegir opciones con bajo contenido de grasas. Por ejemplo, leche descremada en lugar de leche entera. Consumir 5 o ms porciones de frutas o verduras por da. Comer en casa con ms frecuencia. Esto le da ms control sobre lo que come. Cuando coma afuera, elija alimentos saludables. Aprenda a leer las etiquetas de los alimentos. Esto le ayudar a aprender qu cantidad de alimento hay en una porcin. Tenga a mano colaciones con bajo contenido de grasas. Evite las bebidas que contengan mucha azcar. Estas incluyen refrescos, jugo de frutas, t helado con azcar y leche saborizada. Beba suficiente agua para mantener el pis (la orina) de color amarillo plido. No siga las dietas de moda.  Actividad fsica Haga ejercicios con frecuencia, como se lo haya indicado el mdico. La mayora de los adultos deben hacer hasta 150 minutos de ejercicio de intensidad moderada cada semana.Pregntele al mdico lo siguiente: Los tipos de ejercicios que son seguros para   usted. La frecuencia con la que debe hacer los ejercicios. Precaliente y  elongue adecuadamente antes de hacer actividad fsica. Haga un estiramiento lento despus de la actividad (relajacin). Descanse entre los perodos de actividad. Estilo de vida Trabaje con su mdico y con un experto en alimentacin (nutricionista) para establecer un objetivo de prdida de peso que sea adecuado para usted. Limite el tiempo que pasa frente a una pantalla. Busque formas de recompensarse que no incluyan alimentos. No beba alcohol si: El mdico le indica que no lo haga. Est embarazada, puede estar embarazada o est tratando de quedar embarazada. Si bebe alcohol: Limite la cantidad que bebe a lo siguiente: De 0 a 1 medida por da para las mujeres. De 0 a 2 medidas por da para los hombres. Est atento a la cantidad de alcohol que hay en las bebidas que toma. En los [place], una medida equivale a una botella de cerveza de 12 oz (355 ml), un vaso de vino de 5 oz (148 ml) o un vaso de una bebida alcohlica de alta graduacin de 1 oz (44 ml). Instrucciones generales Lleve un diario de su prdida de peso. Esto puede ayudarlo a mantener un registro de lo siguiente: Los alimentos que come. Cunto ejercicio realiza. Tome los medicamentos de venta libre y los recetados solamente como se lo haya indicado el mdico. Tome vitaminas y suplementos solamente como se lo haya indicado el mdico. Considere participar en un grupo de apoyo. Concurra a todas las visitas de seguimiento como se lo haya indicado el mdico. Esto es importante. Comunquese con un mdico si: No puede alcanzar su objetivo de prdida de peso despus de haber modificado su dieta y su estilo de vida durante 6 semanas. Solicite ayuda inmediatamente si: Tiene dificultad para respirar. Tiene pensamientos acerca de lastimarse. Resumen La obesidad es un exceso de grasa corporal. Ser obeso significa que su peso es ms de lo que es saludable para usted. Trabaje con su mdico para establecer un objetivo de prdida de  peso. Haga actividad fsica con regularidad tal como le indic el mdico. Esta informacin no tiene como fin reemplazar el consejo del mdico. Asegresede hacerle al mdico cualquier pregunta que tenga. Document Revised: 12/24/2017 Document Reviewed: 12/24/2017 Elsevier Patient Education  2022 Elsevier Inc.  

## 2020-09-26 NOTE — Telephone Encounter (Signed)
Requested Prescriptions  Pending Prescriptions Disp Refills  . metroNIDAZOLE (FLAGYL) 500 MG tablet [Pharmacy Med Name: metroNIDAZOLE 500 MG Oral Tablet] 14 tablet 0    Sig: Take 1 tablet by mouth twice daily     Off-Protocol Failed - 09/26/2020  5:58 PM      Failed - Medication not assigned to a protocol, review manually.      Passed - Valid encounter within last 12 months    Recent Outpatient Visits          Today Vaginal discharge   [facility] Grayce Sessions, NP   6 months ago Essential hypertension   [facility] Grayce Sessions, NP   1 year ago Vaginal discharge   [facility] Grayce Sessions, NP   1 year ago Screen for STD (sexually transmitted disease)   [facility] Grayce Sessions, NP   1 year ago Essential hypertension   [facility] Hoy Register, MD      Future Appointments            In 6 months Randa Evens, Kinnie Scales, NP [facility]

## 2020-09-26 NOTE — Progress Notes (Signed)
[facility]   Subjective:   Tara [name] is a 40 y.o. [phone] [name] (interpretor Fabio Bering [phone])  complains of an abnormal vaginal discharge for 3 weeks. Discharge described as: scant and malodorous. Vaginal symptoms include none.Vulvar symptoms include abnormal bleeding: irregular occurring approximately every 3-4 days without intermenstrual spotting, discharge described as malodorous, and odor.STI Risk: Very low risk of STD exposure.   Other associated symptoms: odor.Menstrual pattern: She had been bleeding irregularly. Contraception: none.  She reports none recent antibiotic exposure, denies none changes in soaps, detergents coinciding with the onset of her symptoms.  She has not previously self treated or been under treatment by another provider for these symptoms.  HTN management and bp medication refills- Denies shortness of breath, headaches, chest pain or lower extremity edema, sudden onset, vision changes, unilateral weakness, dizziness, paresthesias    Gynecologic History No LMP recorded. (Menstrual status: Irregular Periods). Contraception: none Last Pap: 04//04/2019. Results were: normal  Obstetric History OB History  Gravida Para Term Preterm AB Living  _0 0 1 3  SAB IAB Ectopic Multiple Live Births  0 1 0 0 3    # Outcome Date GA Lbr Len/2nd Weight Sex Delivery Anes PTL Lv  4 Term 03/25/16 31w4d06:01 / 00:09 7 lb 14.6 oz (3.589 kg) M VBAC EPI  LIV  3 Term 01/31/15 323w4d2:10 / 00:45 8 lb 1.6 oz (3.675 kg) M VBAC EPI  LIV     Birth Comments: IOL at [id number] chronic hypertension  2 IAB 2015 [id number]         Birth Comments: medical TAB  1 Term 2005 37w4w0dlb (3.629 kg) F CS-Unspec   LIV     Birth Comments[place] "elevated BP" at around 37w,Sandersd not have trial of labor, went straight for cesarean, baby around 8lb    Past Medical History:  Diagnosis Date   Elevated LFTs    Hypertension    no meds    Past  Surgical History:  Procedure Laterality Date   CESAREAN SECTION     THERAPEUTIC ABORTION      Current Outpatient Medications on File Prior to Visit  Medication Sig Dispense Refill   clotrimazole-betamethasone (LOTRISONE) cream Apply to affected area 2 times daily prn 15 g 0   lisinopril (ZESTRIL) 20 MG tablet Take 1 tablet by mouth once daily 30 tablet 0   metroNIDAZOLE (FLAGYL) 500 MG tablet Take 1 tablet (500 mg total) by mouth 2 (two) times daily. 14 tablet 0   No current facility-administered medications on file prior to visit.    Allergies  Allergen Reactions   Amoxicillin Hives    Social History   Socioeconomic History   Marital status: Single    Spouse name: Not on file   Number of children: Not on file   Years of education: Not on file   Highest education level: Not on file  Occupational History   Not on file  Tobacco Use   Smoking status: Never   Smokeless tobacco: Never  Vaping Use   Vaping Use: Never used  Substance and Sexual Activity   Alcohol use: No   Drug use: No   Sexual activity: Yes  Other Topics Concern   Not on file  Social History Narrative   Not on file   Social Determinants of Health   Financial Resource Strain: Not on file  Food Insecurity: Not on file  Transportation Needs:  Not on file  Physical Activity: Not on file  Stress: Not on file  Social Connections: Not on file  Intimate Partner Violence: Not on file    Family History  Problem Relation Age of Onset   Diabetes Mother    Hypertension Mother     The following portions of the patient's history were reviewed and updated as appropriate: allergies, current medications, past family history, past medical history, past social history, past surgical history and problem list.  Review of Systems Pertinent items are noted in HPI.   Objective:  BP 116/73 (BP Location: Left Arm, Patient Position: Sitting)   Pulse 95   Temp 98.1 F (36.7 C)   CONSTITUTIONAL: Well-developed,  well-nourished female in no acute distress.  HENT:  Normocephalic, atraumatic, External right and left ear normal. Oropharynx is clear and moist EYES: Conjunctivae and EOM are normal. Pupils are equal, round, and reactive to light. No scleral icterus.  NECK: Normal range of motion, supple, no masses.  Normal thyroid.  SKIN: Skin is warm and dry. No rash noted. Not diaphoretic. No erythema. No pallor. [place]: Alert and oriented to person, place, and time. Normal reflexes, muscle tone coordination. No cranial nerve deficit noted. PSYCHIATRIC: Normal mood and affect. Normal behavior. Normal judgment and thought content. CARDIOVASCULAR: Normal heart rate noted, regular rhythm RESPIRATORY: Clear to auscultation bilaterally. Effort and breath sounds normal, no problems with respiration noted. ABDOMEN: Soft, normal bowel sounds, no distention noted.  No tenderness, rebound or guarding. . MUSCULOSKELETAL: Normal range of motion. No tenderness.  No cyanosis, clubbing, or edema.  2+ distal pulses. Assessment:  Diagnoses and all orders for this visit:  Vaginal discharge -     Cervicovaginal ancillary only  Essential hypertension BP goal - < 130/80 met on monotherapy  Explained that having normal blood pressure is the goal and medications are helping to get to goal and maintain normal blood pressure. DIET: Limit salt intake, read nutrition labels to check salt content, limit fried and high fatty foods  Avoid using multisymptom OTC cold preparations that generally contain sudafed which can rise BP. Consult with pharmacist on best cold relief products to use for persons with HTN EXERCISE Discussed incorporating exercise such as walking - 30 minutes most days of the week and can do in 10 minute intervals    -     lisinopril (ZESTRIL) 20 MG tablet; Take 1 tablet (20 mg total) by mouth daily.  Medication refill -     lisinopril (ZESTRIL) 20 MG tablet; Take 1 tablet (20 mg total) by mouth  daily.  Obesity due to excess calories without serious comorbidity, unspecified classification Obesity is 30-39 indicating an excess in caloric intake or underlining conditions. This may lead to other co-morbidities. Lifestyle modifications of diet and exercise may reduce obesity.     No orders of the defined types were placed in this encounter.  Reviewed expectations re: course of current medical issues. Questions answered. Outlined signs and symptoms indicating need for more acute intervention. Patient verbalized understanding. After Visit Summary given.   Plan:   Will follow up on all labs ordered today.  Patient counseled regarding condom use with each sexual activity to promote wellness and prevention of transmission of HIV, syphilis, herpes simplex virus, gonorrhea, chlamydia and trichomoniasis.   This note has been created with Surveyor, quantity. Any transcriptional errors are unintentional.   Kerin Perna, NP 09/26/2020, 9:18 AM

## 2020-09-27 LAB — CERVICOVAGINAL ANCILLARY ONLY
Bacterial Vaginitis (gardnerella): POSITIVE — AB
Candida Glabrata: NEGATIVE
Candida Vaginitis: NEGATIVE
Chlamydia: NEGATIVE
Comment: NEGATIVE
Comment: NEGATIVE
Comment: NEGATIVE
Comment: NEGATIVE
Comment: NEGATIVE
Comment: NORMAL
Neisseria Gonorrhea: NEGATIVE
Trichomonas: NEGATIVE

## 2021-04-02 ENCOUNTER — Ambulatory Visit (INDEPENDENT_AMBULATORY_CARE_PROVIDER_SITE_OTHER): Payer: Self-pay | Admitting: Primary Care

## 2021-04-23 ENCOUNTER — Other Ambulatory Visit: Payer: Self-pay

## 2021-04-23 ENCOUNTER — Ambulatory Visit (INDEPENDENT_AMBULATORY_CARE_PROVIDER_SITE_OTHER): Payer: Self-pay

## 2021-04-23 ENCOUNTER — Ambulatory Visit
Admission: EM | Admit: 2021-04-23 | Discharge: 2021-04-23 | Disposition: A | Discharge status: Home / Self Care | Payer: Self-pay | Attending: Internal Medicine | Admitting: Internal Medicine

## 2021-04-23 ENCOUNTER — Encounter: Payer: Self-pay | Admitting: Emergency Medicine

## 2021-04-23 DIAGNOSIS — J069 Acute upper respiratory infection, unspecified: Secondary | ICD-10-CM

## 2021-04-23 DIAGNOSIS — H65191 Other acute nonsuppurative otitis media, right ear: Secondary | ICD-10-CM

## 2021-04-23 DIAGNOSIS — I10 Essential (primary) hypertension: Secondary | ICD-10-CM

## 2021-04-23 DIAGNOSIS — Z760 Encounter for issue of repeat prescription: Secondary | ICD-10-CM

## 2021-04-23 MED ORDER — CEFDINIR 300 MG PO CAPS: 300.0000 mg | ORAL_CAPSULE | Freq: Two times a day (BID) | ORAL | 0 refills | Status: AC

## 2021-04-23 MED ORDER — LISINOPRIL 20 MG PO TABS: 20.0000 mg | ORAL_TABLET | Freq: Every day | ORAL | 0 refills | Status: DC

## 2021-04-23 NOTE — Telephone Encounter (Signed)
InterpreterMariana [name] ID [id number] Pt called, left VM to call back to discuss symptoms with a nurse.   Summary: sore throat ,right ear pain,cough   Pt called in stated she was experiencing sore throat ,right ear pain, cough feels like she may had possibly had a fever at night. Has not checked her temperature.   Appt available for Thursday office/virtual pt declined it.    Seeking clinical advice.  Pt needs Spanish interpreter.

## 2021-04-23 NOTE — Discharge Instructions (Addendum)
You have an ear infection which is being treated with cefdinir antibiotic.  Your blood pressure medication has been refilled.  Please follow-up with primary care for further evaluation and management of blood pressure.

## 2021-04-23 NOTE — ED Triage Notes (Signed)
Pt here for URI sx with right ear pain and congestion x 3 days

## 2021-04-23 NOTE — Telephone Encounter (Signed)
Pt went to UC at [facility] today at 1106 per documentation. No further triage needed.

## 2021-04-23 NOTE — ED Provider Notes (Signed)
[name]    CSN: [id number] Arrival date & time: 04/23/21  1044      History   Chief Complaint Chief Complaint  Patient presents with   URI    HPI Tara [name] is a 41 y.o. female.   Patient presents with 3-day history of nasal congestion, cough, right ear pain.  Denies any known fevers or sick contacts.  Denies chest pain, shortness of breath, sore throat, nausea, vomiting, diarrhea, abdominal pain.  Patient reports that she has taken ampicillin that she got from a [place] drugstore with no improvement.   URI  Past Medical History:  Diagnosis Date   Elevated LFTs    Hypertension    no meds    Patient Active Problem List   Diagnosis Date Noted   Fatty liver 05/16/2019   Encounter for IUD removal 12/08/2016   Contraception management 06/13/2016   Elevated blood pressure reading 06/13/2016   Preeclampsia, third trimester 03/25/2016   GERD (gastroesophageal reflux disease) 03/05/2016   Previous cesarean delivery, antepartum condition or complication 0000000   Encounter for supervision of normal pregnancy 11/27/2015    Past Surgical History:  Procedure Laterality Date   CESAREAN SECTION     THERAPEUTIC ABORTION      OB History     Gravida  4   Para  3   Term  3   Preterm  0   AB  1   Living  3      SAB  0   IAB  1   Ectopic  0   Multiple  0   Live Births  3            Home Medications    Prior to Admission medications   Medication Sig Start Date End Date Taking? Authorizing Provider  cefdinir (OMNICEF) 300 MG capsule Take 1 capsule (300 mg total) by mouth 2 (two) times daily for 10 days. 04/23/21 05/03/21 Yes Clarece Drzewiecki, Michele Rockers, FNP  clotrimazole-betamethasone (LOTRISONE) cream Apply to affected area 2 times daily prn 11/01/19   Tasia Catchings, Amy V, PA-C  lisinopril (ZESTRIL) 20 MG tablet Take 1 tablet (20 mg total) by mouth daily. 04/23/21   Teodora Medici, FNP  metroNIDAZOLE (FLAGYL) 500 MG tablet Take 1 tablet by mouth  twice daily Patient not taking: Reported on 04/23/2021 09/26/20   Kerin Perna, NP    Family History Family History  Problem Relation Age of Onset   Diabetes Mother    Hypertension Mother     Social History Social History   Tobacco Use   Smoking status: Never   Smokeless tobacco: Never  Vaping Use   Vaping Use: Never used  Substance Use Topics   Alcohol use: No   Drug use: No     Allergies   Amoxicillin   Review of Systems Review of Systems Per HPI  Physical Exam Triage Vital Signs ED Triage Vitals  Enc Vitals Group     BP 04/23/21 1106 129/76     Pulse Rate 04/23/21 1106 99     Resp 04/23/21 1106 18     Temp 04/23/21 1106 98.1 F (36.7 C)     Temp Source 04/23/21 1106 Oral     SpO2 04/23/21 1106 95 %     Weight --      Height --      Head Circumference --      Peak Flow --      Pain Score 04/23/21 1107 3  Pain Loc --      Pain Edu? --      Excl. in [place]? --    No data found.  Updated Vital Signs BP 129/76 (BP Location: Left Arm)    Pulse 99    Temp 98.1 F (36.7 C) (Oral)    Resp 18    SpO2 95%   Visual Acuity Right Eye Distance:   Left Eye Distance:   Bilateral Distance:    Right Eye Near:   Left Eye Near:    Bilateral Near:     Physical Exam Constitutional:      General: She is not in acute distress.    Appearance: Normal appearance. She is not toxic-appearing or diaphoretic.  HENT:     Head: Normocephalic and atraumatic.     Right Ear: Ear canal normal. No swelling or tenderness. No middle ear effusion. No mastoid tenderness. Tympanic membrane is erythematous. Tympanic membrane is not perforated or bulging.     Left Ear: Tympanic membrane and ear canal normal.     Nose: Congestion present.     Mouth/Throat:     Mouth: Mucous membranes are moist.     Pharynx: No posterior oropharyngeal erythema.  Eyes:     Extraocular Movements: Extraocular movements intact.     Conjunctiva/sclera: Conjunctivae normal.     Pupils: Pupils are  equal, round, and reactive to light.  Cardiovascular:     Rate and Rhythm: Normal rate and regular rhythm.     Pulses: Normal pulses.     Heart sounds: Normal heart sounds.  Pulmonary:     Effort: Pulmonary effort is normal. No respiratory distress.     Breath sounds: Normal breath sounds. No stridor. No wheezing, rhonchi or rales.  Abdominal:     General: Abdomen is flat. Bowel sounds are normal.     Palpations: Abdomen is soft.  Musculoskeletal:        General: Normal range of motion.     Cervical back: Normal range of motion.  Skin:    General: Skin is warm and dry.  Neurological:     General: No focal deficit present.     Mental Status: She is alert and oriented to person, place, and time. Mental status is at baseline.  Psychiatric:        Mood and Affect: Mood normal.        Behavior: Behavior normal.     UC Treatments / Results  Labs (all labs ordered are listed, but only abnormal results are displayed) Labs Reviewed  COVID-19, FLU A+B NAA    EKG   Radiology No results found.  Procedures Procedures (including critical care time)  Medications Ordered in UC Medications - No data to display  Initial Impression / Assessment and Plan / UC Course  I have reviewed the triage vital signs and the nursing notes.  Pertinent labs & imaging results that were available during my care of the patient were reviewed by me and considered in my medical decision making (see chart for details).     Patient presents with symptoms likely from a viral upper respiratory infection. Differential includes bacterial pneumonia, sinusitis, allergic rhinitis, COVID-19, flu. Do not suspect underlying cardiopulmonary process. Symptoms seem unlikely related to ACS, CHF or COPD exacerbations, pneumonia, pneumothorax. Patient is nontoxic appearing and not in need of emergent medical intervention.  COVID-19 and flu test pending.  Recommended symptom control with over the counter medications:  Daily oral anti-histamine, Oral decongestant or IN corticosteroid, saline irrigations, cepacol  lozenges, Robitussin, Delsym, honey tea.  Cefdinir to treat right ear infection as patient reports that she has an intolerance to amoxicillin.  Advised patient to monitor for reaction.  Return if symptoms fail to improve in 1-2 weeks or you develop shortness of breath, chest pain, severe headache. Patient states understanding and is agreeable.  Discharged with PCP followup.  Final Clinical Impressions(s) / UC Diagnoses   Final diagnoses:  Other non-recurrent acute nonsuppurative otitis media of right ear  Viral upper respiratory tract infection with cough  Essential hypertension     Discharge Instructions      You have an ear infection which is being treated with cefdinir antibiotic.  Your blood pressure medication has been refilled.  Please follow-up with primary care for further evaluation and management of blood pressure.    ED Prescriptions     Medication Sig Dispense Auth. Provider   lisinopril (ZESTRIL) 20 MG tablet Take 1 tablet (20 mg total) by mouth daily. 30 tablet [place], [place], [place]   cefdinir (OMNICEF) 300 MG capsule Take 1 capsule (300 mg total) by mouth 2 (two) times daily for 10 days. 20 capsule Teodora Medici, [place]      PDMP not reviewed this encounter.   Teodora Medici, [place] 04/23/21 1214

## 2021-04-24 LAB — COVID-19, FLU A+B NAA
Influenza A, NAA: NOT DETECTED
Influenza B, NAA: NOT DETECTED
SARS-CoV-2, NAA: DETECTED — AB

## 2021-05-08 ENCOUNTER — Ambulatory Visit (INDEPENDENT_AMBULATORY_CARE_PROVIDER_SITE_OTHER): Payer: Self-pay | Admitting: Primary Care

## 2021-06-29 ENCOUNTER — Other Ambulatory Visit: Payer: Self-pay

## 2021-06-29 ENCOUNTER — Ambulatory Visit
Admission: EM | Admit: 2021-06-29 | Discharge: 2021-06-29 | Disposition: A | Discharge status: Home / Self Care | Payer: Self-pay | Attending: Physician Assistant | Admitting: Physician Assistant

## 2021-06-29 DIAGNOSIS — I10 Essential (primary) hypertension: Secondary | ICD-10-CM

## 2021-06-29 DIAGNOSIS — Z760 Encounter for issue of repeat prescription: Secondary | ICD-10-CM

## 2021-06-29 MED ORDER — LISINOPRIL 20 MG PO TABS: 20.0000 mg | ORAL_TABLET | Freq: Every day | ORAL | 0 refills | Status: DC

## 2021-06-29 NOTE — ED Triage Notes (Signed)
Pt presents today requesting a lisinopril refill. She has been out of meds for approx 1 wk. Denies sxs. No current PCP. ?

## 2021-06-29 NOTE — ED Provider Notes (Signed)
?[name] ? ? ? ?CSN: [id number] ?Arrival date & time: 06/29/21  1107 ? ? ?  ? ?History   ?Chief Complaint ?Chief Complaint  ?Patient presents with  ? Medication Refill  ? ? ?HPI ?Sharnelle Maudlin is a 41 y.o. female.  ? ?Patient here today for refill of blood pressure medication. She reports she does have PCP but it take a while to get an appointment. She has not had any trouble with lisinopril and denies any known side effects. She has not had any chest pain or shortness of breath.  ? ?The history is provided by the patient. The history is limited by a language barrier. A language interpreter was used [place] [place]).  ?Medication Refill ? ?Past Medical History:  ?Diagnosis Date  ? Elevated LFTs   ? Hypertension   ? no meds  ? ? ?Patient Active Problem List  ? Diagnosis Date Noted  ? Fatty liver 05/16/2019  ? Encounter for IUD removal 12/08/2016  ? Contraception management 06/13/2016  ? Elevated blood pressure reading 06/13/2016  ? Preeclampsia, third trimester 03/25/2016  ? GERD (gastroesophageal reflux disease) 03/05/2016  ? Previous cesarean delivery, antepartum condition or complication 02/04/2016  ? Encounter for supervision of normal pregnancy 11/27/2015  ? ? ?Past Surgical History:  ?Procedure Laterality Date  ? CESAREAN SECTION    ? THERAPEUTIC ABORTION    ? ? ?OB History   ? ? Gravida  ?4  ? Para  ?3  ? Term  ?3  ? Preterm  ?0  ? AB  ?1  ? Living  ?3  ?  ? ? SAB  ?0  ? IAB  ?1  ? Ectopic  ?0  ? Multiple  ?0  ? Live Births  ?3  ?   ?  ?  ? ? ? ?Home Medications   ? ?Prior to Admission medications   ?Medication Sig Start Date End Date Taking? Authorizing Provider  ?clotrimazole-betamethasone (LOTRISONE) cream Apply to affected area 2 times daily prn 11/01/19   Cathie Hoops, Amy V, PA-C  ?lisinopril (ZESTRIL) 20 MG tablet Take 1 tablet (20 mg total) by mouth daily. 06/29/21   Tomi Bamberger, PA-C  ?metroNIDAZOLE (FLAGYL) 500 MG tablet Take 1 tablet by mouth twice daily ?Patient not taking:  Reported on 04/23/2021 09/26/20   Grayce Sessions, NP  ? ? ?Family History ?Family History  ?Problem Relation Age of Onset  ? Diabetes Mother   ? Hypertension Mother   ? ? ?Social History ?Social History  ? ?Tobacco Use  ? Smoking status: Never  ? Smokeless tobacco: Never  ?Vaping Use  ? Vaping Use: Never used  ?Substance Use Topics  ? Alcohol use: No  ? Drug use: No  ? ? ? ?Allergies   ?Amoxicillin ? ? ?Review of Systems ?Review of Systems  ?Constitutional:  Negative for chills and fever.  ?Eyes:  Negative for discharge and redness.  ?Respiratory:  Negative for shortness of breath.   ?Cardiovascular:  Negative for chest pain.  ?Gastrointestinal:  Negative for nausea and vomiting.  ? ? ?Physical Exam ?Triage Vital Signs ?ED Triage Vitals  ?Enc Vitals Group  ?   BP 06/29/21 1215 (!) 141/84  ?   Pulse Rate 06/29/21 1215 85  ?   Resp 06/29/21 1215 18  ?   Temp 06/29/21 1215 97.8 ?F (36.6 ?C)  ?   Temp Source 06/29/21 1215 Oral  ?   SpO2 06/29/21 1215 98 %  ?   Weight --   ?  Height --   ?   Head Circumference --   ?   Peak Flow --   ?   Pain Score 06/29/21 1218 0  ?   Pain Loc --   ?   Pain Edu? --   ?   Excl. in [place]? --   ? ?No data found. ? ?Updated Vital Signs ?BP (!) 141/84 (BP Location: Left Arm)   Pulse 85   Temp 97.8 ?F (36.6 ?C) (Oral)   Resp 18   SpO2 98%  ? ?Physical Exam ?Vitals and nursing note reviewed.  ?Constitutional:   ?   General: She is not in acute distress. ?   Appearance: Normal appearance. She is not ill-appearing.  ?HENT:  ?   Head: Normocephalic and atraumatic.  ?Eyes:  ?   Conjunctiva/sclera: Conjunctivae normal.  ?Cardiovascular:  ?   Rate and Rhythm: Normal rate and regular rhythm.  ?   Heart sounds: Normal heart sounds. No murmur heard. ?Pulmonary:  ?   Effort: Pulmonary effort is normal. No respiratory distress.  ?   Breath sounds: Normal breath sounds. No wheezing, rhonchi or rales.  ?Neurological:  ?   Mental Status: She is alert.  ?Psychiatric:     ?   Mood and Affect: Mood  normal.     ?   Behavior: Behavior normal.     ?   Thought Content: Thought content normal.  ? ? ? ?UC Treatments / Results  ?Labs ?(all labs ordered are listed, but only abnormal results are displayed) ?Labs Reviewed - No data to display ? ?EKG ? ? ?Radiology ?No results found. ? ?Procedures ?Procedures (including critical care time) ? ?Medications Ordered in UC ?Medications - No data to display ? ?Initial Impression / Assessment and Plan / UC Course  ?I have reviewed the triage vital signs and the nursing notes. ? ?Pertinent labs & imaging results that were available during my care of the patient were reviewed by me and considered in my medical decision making (see chart for details). ? ?  ?Lisinopril refilled at prior dosage. Recommended she follow up as soon as possible with her PCP as I suspect she will need routine labs, etc. Patient expresses understanding.  ? ?Final Clinical Impressions(s) / UC Diagnoses  ? ?Final diagnoses:  ?Essential hypertension  ? ? ? ?Discharge Instructions   ? ?  ? ?Please follow up with primary care provider clinic for further refills.  ? ? ? ? ? ?ED Prescriptions   ? ? Medication Sig Dispense Auth. Provider  ? lisinopril (ZESTRIL) 20 MG tablet Take 1 tablet (20 mg total) by mouth daily. 30 tablet Tomi Bamberger, PA-C  ? ?  ? ?PDMP not reviewed this encounter. ?  ?Tomi Bamberger, PA-C ?06/29/21 1234 ? ?

## 2021-06-29 NOTE — Discharge Instructions (Signed)
?  Please follow up with primary care provider clinic for further refills.  ? ? ?

## 2021-07-10 ENCOUNTER — Telehealth (INDEPENDENT_AMBULATORY_CARE_PROVIDER_SITE_OTHER): Payer: Self-pay

## 2021-07-10 NOTE — Telephone Encounter (Signed)
Office received fax documentation of patient call requesting refill of medication. Patient has history of hypertension and is scheduled for an appointment on 07/17/21. Attempted to call patient back to inform that refills were sent on 3/18 by [facility] staff. This would be enough to last until appointment with PCP. Patient did not answer. Left message asking patient to return call to [facility] at  [phone]. Nat Christen, CMA  ? ?Call made with [place] interpreter [phone]) ?

## 2021-07-11 ENCOUNTER — Telehealth (INDEPENDENT_AMBULATORY_CARE_PROVIDER_SITE_OTHER): Payer: Self-pay

## 2021-07-11 NOTE — Telephone Encounter (Addendum)
Call returned to patient with assistance of [place] interpreter [phone]) States she found her Bp medication and has been taking. She does not need a sooner appointment. Will present to upcoming appointment on 07/17/21. Nat Christen, CMA  ?

## 2021-07-17 ENCOUNTER — Inpatient Hospital Stay (INDEPENDENT_AMBULATORY_CARE_PROVIDER_SITE_OTHER): Payer: Self-pay | Admitting: Primary Care

## 2021-08-02 ENCOUNTER — Other Ambulatory Visit (HOSPITAL_COMMUNITY)
Admission: RE | Admit: 2021-08-02 | Discharge: 2021-08-02 | Disposition: A | Discharge status: Home / Self Care | Payer: Self-pay | Source: Ambulatory Visit | Attending: Primary Care | Admitting: Primary Care

## 2021-08-02 ENCOUNTER — Ambulatory Visit (INDEPENDENT_AMBULATORY_CARE_PROVIDER_SITE_OTHER): Payer: Self-pay | Admitting: Primary Care

## 2021-08-02 ENCOUNTER — Encounter (INDEPENDENT_AMBULATORY_CARE_PROVIDER_SITE_OTHER): Payer: Self-pay | Admitting: Primary Care

## 2021-08-02 ENCOUNTER — Other Ambulatory Visit: Payer: Self-pay

## 2021-08-02 VITALS — BP 128/85 | HR 92 | Temp 97.4°F | Ht 62.5 in | Wt 207.4 lb

## 2021-08-02 DIAGNOSIS — N898 Other specified noninflammatory disorders of vagina: Secondary | ICD-10-CM

## 2021-08-02 DIAGNOSIS — Z131 Encounter for screening for diabetes mellitus: Secondary | ICD-10-CM

## 2021-08-02 DIAGNOSIS — Z760 Encounter for issue of repeat prescription: Secondary | ICD-10-CM

## 2021-08-02 DIAGNOSIS — E1101 Type 2 diabetes mellitus with hyperosmolarity with coma: Secondary | ICD-10-CM

## 2021-08-02 DIAGNOSIS — Z7189 Other specified counseling: Secondary | ICD-10-CM

## 2021-08-02 DIAGNOSIS — I10 Essential (primary) hypertension: Secondary | ICD-10-CM

## 2021-08-02 LAB — POCT GLYCOSYLATED HEMOGLOBIN (HGB A1C): Hemoglobin A1C: 11.5 % — AB (ref 4.0–5.6)

## 2021-08-02 MED ORDER — METFORMIN HCL 1000 MG PO TABS
1000.0000 mg | ORAL_TABLET | Freq: Two times a day (BID) | ORAL | 1 refills | Status: DC
  Filled 2021-08-02: qty 60, 30d supply, fill #0
  Filled 2021-10-07: qty 60, 30d supply, fill #1
  Filled 2021-11-29: qty 60, 30d supply, fill #2
  Filled 2022-01-02: qty 60, 30d supply, fill #3

## 2021-08-02 MED ORDER — GLIPIZIDE 10 MG PO TABS
10.0000 mg | ORAL_TABLET | Freq: Two times a day (BID) | ORAL | 1 refills | Status: DC
  Filled 2021-08-02: qty 60, 30d supply, fill #0
  Filled 2021-10-07: qty 60, 30d supply, fill #1
  Filled 2021-11-29: qty 60, 30d supply, fill #2
  Filled 2022-01-02: qty 60, 30d supply, fill #3

## 2021-08-02 MED ORDER — TRULICITY 0.75 MG/0.5ML ~~LOC~~ SOAJ
0.7500 mg | SUBCUTANEOUS | 4 refills | Status: DC
  Filled 2021-08-02: qty 2, 28d supply, fill #0
  Filled 2021-10-07: qty 2, 28d supply, fill #1
  Filled 2021-11-29: qty 2, 28d supply, fill #2
  Filled 2022-01-02: qty 2, 28d supply, fill #3

## 2021-08-02 MED ORDER — LISINOPRIL 20 MG PO TABS
20.0000 mg | ORAL_TABLET | Freq: Every day | ORAL | 1 refills | Status: DC
  Filled 2021-08-02: qty 30, 30d supply, fill #0
  Filled 2021-10-07: qty 30, 30d supply, fill #1
  Filled 2021-11-29: qty 30, 30d supply, fill #2
  Filled 2022-01-02: qty 30, 30d supply, fill #3

## 2021-08-02 NOTE — Progress Notes (Signed)
?Tara [name] ? ? ?Subjective:  ? Tara [name] is a 41 y.o. obese [name] (interpreter Tara [name] [phone] ) presents for Urgent care follow up. She presented on 06/29/21, for medication refills on Bp. Patient has No headache, No chest pain, No abdominal pain - No Nausea, No new weakness tingling or numbness, No Cough - shortness of breath. ?Past Medical History:  ?Diagnosis Date  ? Elevated LFTs   ? Hypertension   ? no meds  ?  ? ?Allergies  ?Allergen Reactions  ? Amoxicillin Hives  ? ? ?  ?Current Outpatient Medications on File Prior to Visit  ?Medication Sig Dispense Refill  ? clotrimazole-betamethasone (LOTRISONE) cream Apply to affected area 2 times daily prn 15 g 0  ? lisinopril (ZESTRIL) 20 MG tablet Take 1 tablet (20 mg total) by mouth daily. 30 tablet 0  ? metroNIDAZOLE (FLAGYL) 500 MG tablet Take 1 tablet by mouth twice daily (Patient not taking: Reported on 04/23/2021) 14 tablet 0  ? ?No current facility-administered medications on file prior to visit.  ? ? ? ?Review of System: ?Comprehensive ROS Pertinent positive and negative noted in HPI   ? ?Objective:  ?BP 128/85 (BP Location: Right Arm, Patient Position: Sitting, Cuff Size: Normal)   Pulse 92   Temp (!) 97.4 ?F (36.3 ?C) (Oral)   Wt 207 lb 6.4 oz (94.1 kg)   SpO2 95%   BMI 37.33 kg/m?  ? Tara [name] Weights  ? 08/02/21 0914  ?Weight: 207 lb 6.4 oz (94.1 kg)  ? ? ?Physical Exam: ?General Appearance: Well nourished, in no apparent distress. ?Eyes: PERRLA, EOMs, conjunctiva no swelling or erythema ?Sinuses: No Frontal/maxillary tenderness ?ENT/Mouth: Ext aud canals clear, TMs without erythema, bulging. Hearing normal.  ?Neck: Supple, thyroid normal.  ?Respiratory: Respiratory effort normal, BS equal bilaterally without rales, rhonchi, wheezing or stridor.  ?Cardio: RRR with no MRGs. Brisk peripheral pulses without edema.  ?Abdomen: Soft, + BS.  Non tender, no guarding, rebound, hernias, masses. ?Lymphatics: Non tender  without lymphadenopathy.  ?Musculoskeletal: Full ROM, 5/5 strength, normal gait.  ?Skin: Warm, dry without rashes, lesions, ecchymosis.  ?Neuro: Cranial nerves intact. Normal muscle tone, no cerebellar symptoms. Sensation intact.  ?Psych: Awake and oriented X 3, normal affect, Insight and Judgment appropriate.  ? ? ?Assessment:  ?Tara [name] was seen today for hospitalization follow-up and medication refill. ? ?Diagnoses and all orders for this visit: ? ?Screening for diabetes mellitus ?-     HgB A1c 11.5  ? 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 What do you see nothing) nephropathy decrease in kidney function- can lead to dialysis-on a machine 3 days a week to filter your kidney, neuropathy- numbness and tinging in your hands and feet,  increase risk of heart attack and stroke, and amputation due to decrease wound healing and circulation. Decrease your risk by taking medication daily as prescribed, monitor carbohydrates- foods that are high in carbohydrates are the following rice, potatoes, breads, sugars, and pastas.  Reduction in the intake (eating) will assist in lowering your blood sugars. Exercise daily at least 30 minutes daily.  ?-     CMP14+EGFR; Future ?-     CBC with Differential/Platelet; Future ?-     Lipid panel; Future ? ?Essential hypertension ?Well controlled on monotherapy .We have discussed target BP range and blood pressure goal. We discussed the importance of compliance with  medical therapy and DASH diet recommended, consequences of uncontrolled hypertension discussed.  ?- continue current BP medications  ?-     lisinopril (ZESTRIL) 20 MG tablet; Take 1 tablet (20 mg total) by mouth daily. ?-     CMP14+EGFR; Future ? ?Medication refill ?-     lisinopril (ZESTRIL) 20 MG tablet; Take 1 tablet (20 mg total) by mouth  daily. ? ?Vaginal discharge ?-     Cervicovaginal ancillary only ? ?Other orders ?-     metFORMIN (GLUCOPHAGE) 1000 MG tablet; Take 1 tablet (1,000 mg total) by mouth 2 (two) times daily with a meal. ?-     Dulaglutide (TRULICITY) 3.49 QJ/4.4DX SOPN; Inject 0.75 mg into the skin once a week. ?-     glipiZIDE (GLUCOTROL) 10 MG tablet; Take 1 tablet (10 mg total) by mouth 2 (two) times daily before a meal. ? ?Diabetes education, encounter for ? Discussed medication and side effect demonstrated how to administer Trulicity and return demonstration ?This note has been created with Surveyor, quantity. Any transcriptional errors are unintentional.  ? ?Tara Perna, NP ?08/02/2021, 9:15 AM ?  ? ?

## 2021-08-02 NOTE — Patient Instructions (Signed)
Dulaglutide Injection ??Qu? es [employer]? ?La DULAGLUTIDA controla los niveles de az?car en la sangre en personas con diabetes tipo 2. Se [place] con cambios en el estilo de vida tales como dieta y ejercicio f?sico. Podr?a disminuir el riesgo de problemas que necesiten tratamiento en el hospital. Estos problemas incluyen ataque cardiaco o accidente cerebrovascular. ?Este medicamento puede ser utilizado para otros usos; si tiene alguna pregunta consulte con su proveedor de atenci?n m?dica o con su farmac?utico. ?MARCAS COMUNES: Trulicity ??Qu? le debo informar a mi profesional de la salud antes de tomar este medicamento? ?Necesitan saber si usted presenta alguno de los [employer] [facility] [employer] o situaciones: ?tumores endocrinos (neoplasia end?crina m?ltiple tipo II) o si alguien en su familiar tuvo esos tumores ?enfermedad ocular, problemas de la visi?n ?antecedentes de pancreatitis ?enfermedad renal ?enfermedad hep?tica ?problemas estomacales o intestinales ?c?ncer de tiroides o si alguien en su familia tuvo c?ncer de tiroides ?una reacci?n al?rgica o inusual a la dulaglutida, a otros medicamentos, alimentos, colorantes o conservantes ?si est? embarazada o buscando quedar embarazada ?si est? amamantando a un beb? ??C?mo debo utilizar este medicamento? ?Este medicamento se inyecta debajo de la piel. Le ense?ar?n c?mo prepararlo y administrarlo. ?selo seg?n las instrucciones en la etiqueta el mismo d?a todas las semanas. NO presione repetidamente el inyector. Siga us?ndolo a menos que su proveedor de atenci?n m?dica le indique dejar de [employer]. ?Si [place] este medicamento con insulina, debe inyectar este medicamento y la insulina por separado. No los mezcle. No se aplique una inyecci?n al lado de la otra. Cambie (rote) los sitios de inyecci?n con cada inyecci?n. ?Este f?rmaco viene con INSTRUCCIONES DE USO. P?dale a su farmac?utico que le indique c?mo usar [employer]. Lea la informaci?n atentamente. Hable con su  farmac?utico o su proveedor de atenci?n m?dica si tiene alguna pregunta. ?Es importante que deseche las agujas y las jeringas usadas en un recipiente resistente a los pinchazos. No las deseche en la basura. Si no tiene un recipiente resistente a los pinchazos, llame a su farmac?utico o proveedor de atenci?n de la salud para obtenerlo. ?Su farmac?utico le dar? una Gu?a del medicamento especial (MedGuide, nombre en ingl?s) con cada receta y en cada ocasi?n que la vuelva a surtir. Aseg?rese de leer esta informaci?n cada vez cuidadosamente. ?Hable con su proveedor de atenci?n m?dica sobre el uso de este medicamento en ni?os. Puede requerir atenci?n especial. ?Sobredosis: P?ngase en contacto inmediatamente con un centro toxicol?gico o una sala de urgencia si usted cree que haya tomado demasiado medicamento. ?ATENCI?N: [employer] es solo para usted. No comparta este medicamento con nadie. ??Qu? sucede si me olvido de una dosis? ?Si olvida una dosis, admin?strela lo antes posible, a menos que hayan pasado m?s de 3 d?as. Si han pasado m?s de 3 d?as desde el momento de administraci?n habitual, omita la dosis que se olvid?. Administre la pr?xima dosis a la hora habitual. ??Qu? puede interactuar con este medicamento? ?otros medicamentos para la diabetes ?Muchos medicamentos pueden causar cambios en el nivel de az?car en la sangre. Estos incluyen: ?bebidas con alcohol ?medicamentos antivirales para el VIH o SIDA ?aspirina y medicamentos tipo aspirina ?ciertos medicamentos para la presi?n arterial, enfermedad cardiaca y frecuencia cardiaca irregular ?cromo ?diur?ticos ?hormonas femeninas, tales como estr?genos o progestinas, p?ldoras anticonceptivas ?fenofibrato ?gemfibrozil ?isoniazida ?lanreotida ?hormonas masculinas o esteroides anab?licos ?IMAO, tales como Carbex, Eldepryl, Marplan, Nardil y Parnate ?medicamentos para alergias, asma, resfr?os o tos ?medicamentos para la depresi?n, ansiedad o trastornos  psic?ticos ?medicamentos para bajar de peso ?niacina ?nicotina ?Tara [name],  medicamentos para el dolor y la inflamaci?n, tales como ibuprofeno o naproxeno ?octreotida ?pasireotida ?pentamidina ?fenito?na ?probenecid ?antibi?ticos del grupo de las quinolonas, tales como ciprofloxacino, levofloxacino y ofloxacino ?algunos suplementos diet?ticos a base de hierbas ?medicamentos esteroideos, tales como la prednisona o la cortisona ?sulfametoxasol; trimetoprima ?hormonas tiroideas ?Algunos medicamentos pueden ocultar los s?ntomas de advertencia de niveles bajos de az?car en la sangre (hipoglucemia). Es posible que deba monitorear m?s atentamente su nivel de az?car en la sangre si est? tomando uno de estos medicamentos. Estos medicamentos incluyen: ?betabloqueadores, que con frecuencia se usan para la presi?n arterial alta o problemas cardiacos (algunos ejemplos son atenolol, metoprolol y propranolol) ?clonidina ?guanetidina ?reserpina ?Puede ser que esta lista no menciona todas las posibles interacciones. Informe a su profesional de la salud de todos los productos a base de hierbas, medicamentos de venta libre o suplementos nutritivos que est? tomando. Si usted fuma, consume bebidas alcoh?licas o si utiliza drogas ilegales, ind?queselo tambi?n a su profesional de la salud. Algunas sustancias pueden interactuar con su medicamento. ??A qu? debo estar atento al usar este medicamento? ?Visite a su proveedor de atenci?n m?dica para que revise regularmente su evoluci?n. ?Consulte con su proveedor de atenci?n m?dica si tiene diarrea grave, n?useas y v?mitos, o sudoraci?n intensa. La p?rdida de demasiado l?quido corporal puede hacer que sea peligroso usar este medicamento. ?Se monitorizar? una prueba llamada Hemoglobina A1C (A1C). Es un an?lisis de sangre sencillo. Mide su control del nivel de az?car en la sangre durante los ?ltimos 2 a 3 meses. Se le realizar? esta prueba cada 3 a 6 meses. ?Aprenda a revisar su nivel de az?car en la  sangre. Conozca los s?ntomas del nivel bajo y alto de az?car en la sangre y c?mo controlarlos. ?Siempre lleve con usted una fuente r?pida de az?car por si tiene s?ntomas de nivel bajo de az?car en la sangre. Algunos ejemplos incluyen caramelos duros de az?car o tabletas de glucosa. Aseg?rese de que otras personas sepan que usted se puede ahogar si come o bebe cuando presenta s?ntomas graves de nivel bajo de az?car en la sangre, como convulsiones o p?rdida del conocimiento. Debe obtener ayuda m?dica de inmediato. ?Informe a su proveedor de atenci?n m?dica si tiene niveles altos de az?car en la sangre. Es posible que deba cambiar la dosis de su medicamento. Si est? enfermo o hace m?s ejercicio que lo habitual, es posible que necesite cambiar la dosis de su medicamento. ?No saltee comidas. Pregunte a su proveedor de atenci?n m?dica si debe evitar el alcohol. Muchos productos de venta libre para la tos y el resfr?o contienen az?car o alcohol. Estos pueden afectar los niveles de az?car en la sangre. ?Los inyectores nunca deben compartirse. Incluso si se cambia la aguja, al compartir se pueden contagiar virus como la hepatitis o el VIH. ?Use un brazalete o una cadena de identificaci?n m?dica. Lleve consigo una tarjeta que describa su afecci?n. Incluya en la tarjeta una lista de los medicamentos y las dosis que usa. ??Qu? efectos secundarios puedo tener al utilizar este medicamento? ?Efectos secundarios que debe informar a su m?dico o a su profesional de la salud tan pronto como sea posible: ?reacciones al?rgicas (erupci?n cut?nea, comez?n/picaz?n o urticaria; hinchaz?n de la cara, los labios o la lengua) ?cambios en la visi?n ?diarrea que contin?a o es grave ?infecci?n (fiebre, escalofr?os, tos, dolor de garganta, dolor o dificultad para orinar) ?lesi?n renal (dificultad para orinar o cambios en la cantidad de orina) ?niveles bajos de az?car en la sangre (ansiedad; confusi?n; mareos; aumento del apetito;   debilidad o  cansancio inusuales; aumento de la sudoraci?n; temblores; piel fr?a y sudorosa; irritabilidad; dolor de cabeza; visi?n borrosa; frecuencia cardiaca r?pida; p?rdida del conocimiento) ?bulto o hinchaz?n en el cuello ?problemas

## 2021-08-05 ENCOUNTER — Other Ambulatory Visit (INDEPENDENT_AMBULATORY_CARE_PROVIDER_SITE_OTHER): Payer: Self-pay | Admitting: Primary Care

## 2021-08-05 ENCOUNTER — Other Ambulatory Visit: Payer: Self-pay

## 2021-08-05 ENCOUNTER — Telehealth (INDEPENDENT_AMBULATORY_CARE_PROVIDER_SITE_OTHER): Payer: Self-pay

## 2021-08-05 LAB — CERVICOVAGINAL ANCILLARY ONLY
Bacterial Vaginitis (gardnerella): NEGATIVE
Candida Glabrata: NEGATIVE
Candida Vaginitis: POSITIVE — AB
Chlamydia: NEGATIVE
Comment: NEGATIVE
Comment: NEGATIVE
Comment: NEGATIVE
Comment: NEGATIVE
Comment: NEGATIVE
Comment: NORMAL
Neisseria Gonorrhea: NEGATIVE
Trichomonas: NEGATIVE

## 2021-08-05 MED ORDER — FLUCONAZOLE 150 MG PO TABS: 150.0000 mg | ORAL_TABLET | Freq: Every day | ORAL | 1 refills | Status: DC

## 2021-08-05 NOTE — Telephone Encounter (Signed)
-----   Message from Kerin Perna, NP sent at 08/05/2021  1:57 PM EDT ----- ?You have a yeast infection. Sent in diflucan for treatment. If symptoms continue take the second pill. ?

## 2021-08-05 NOTE — Telephone Encounter (Signed)
Called patient with pacific interpreter [phone]). Patient aware of yeast infection and medication being sent to pharmacy. Advised patient on how to properly take medication. She verbalized understanding. Maryjean Morn, CMA  ?

## 2021-08-06 ENCOUNTER — Other Ambulatory Visit: Payer: Self-pay

## 2021-08-30 ENCOUNTER — Ambulatory Visit: Payer: Self-pay | Admitting: Pharmacist

## 2021-09-22 IMAGING — US US ABDOMEN COMPLETE
1 series · 13 of 25 positions shown · non-contrast
Comparison: None.

CLINICAL DATA: Abdominal pain with elevated liver enzymes

EXAM:
ABDOMEN ULTRASOUND COMPLETE

[Series 1: us abdomen complete · 13 of 96 slices shown]
[im 1/96]
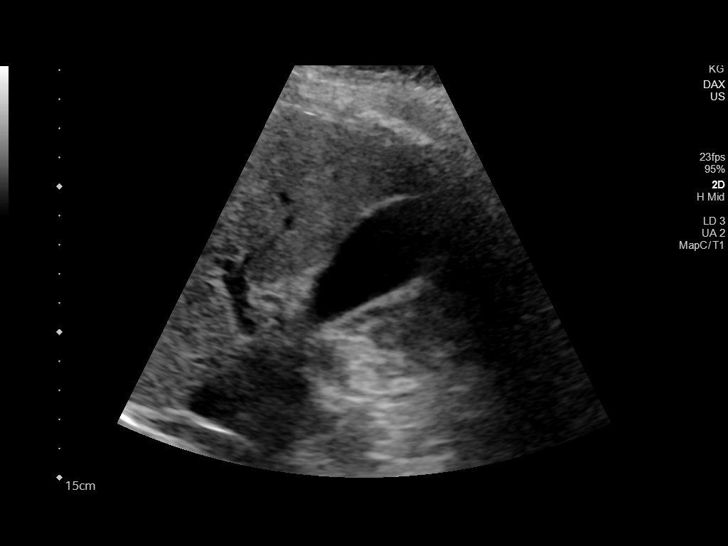
[im 8/96]
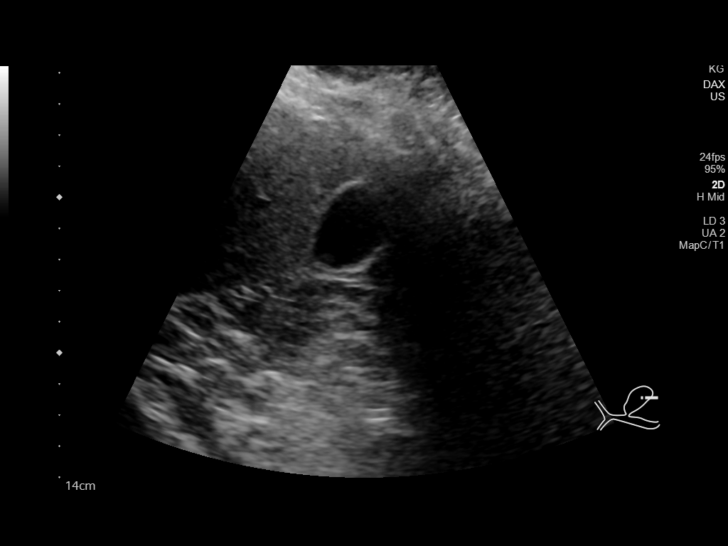
[im 16/96]
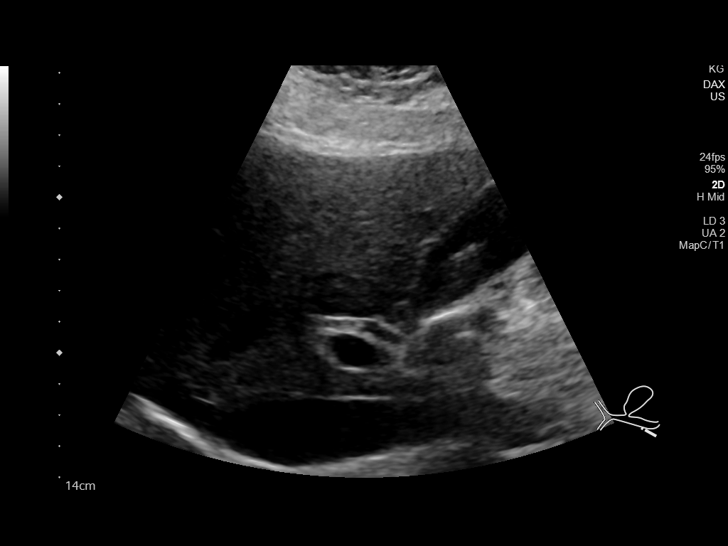
[im 24/96]
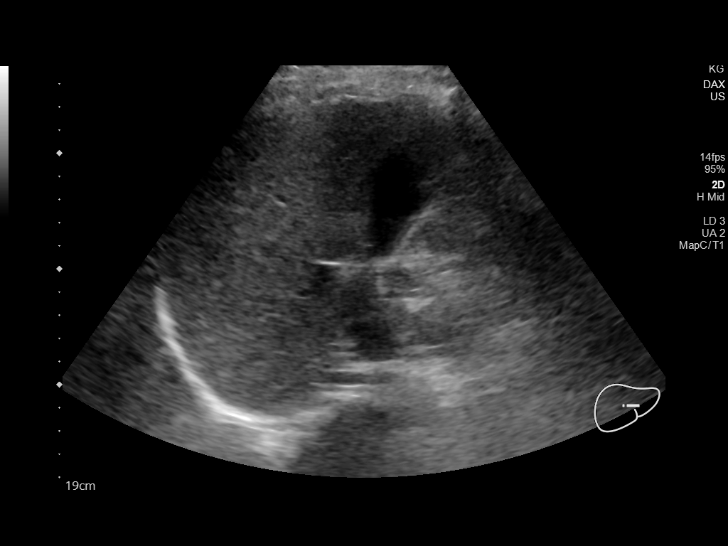
[im 32/96]
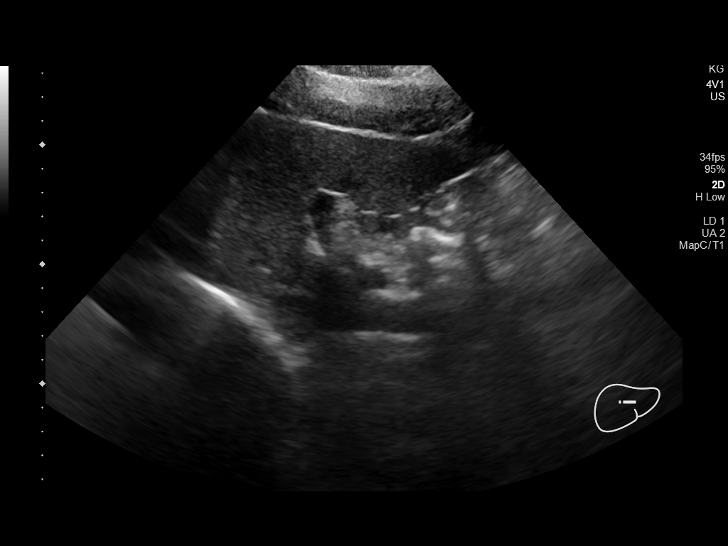
[im 40/96]
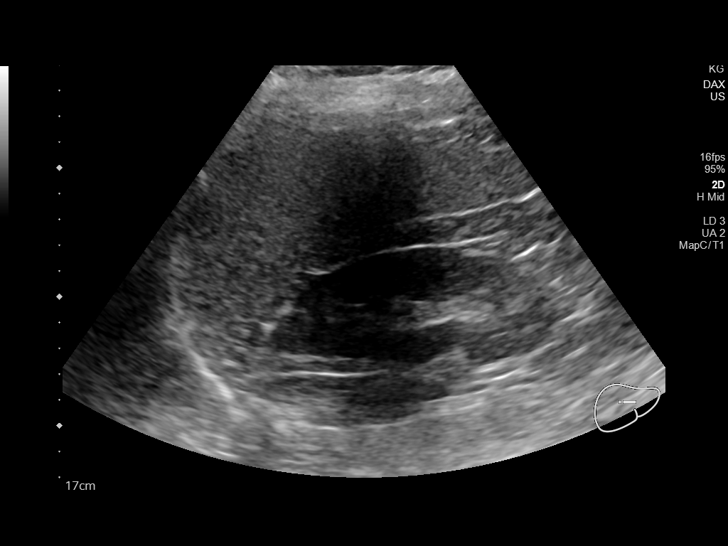
[im 48/96]
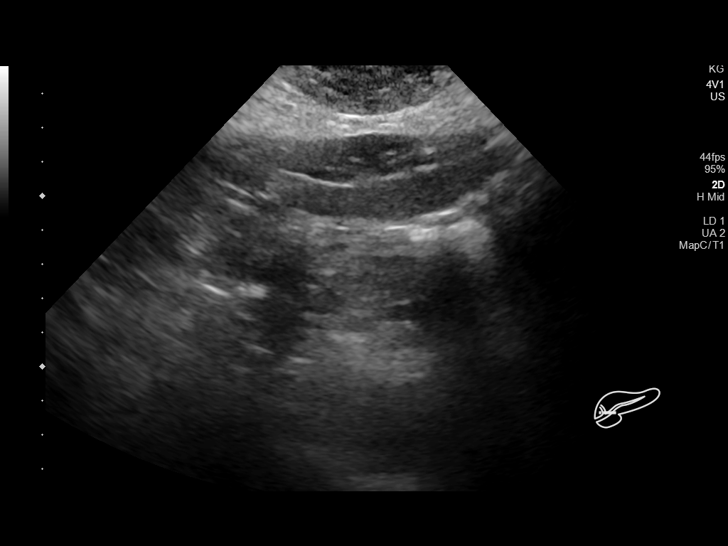
[im 56/96]
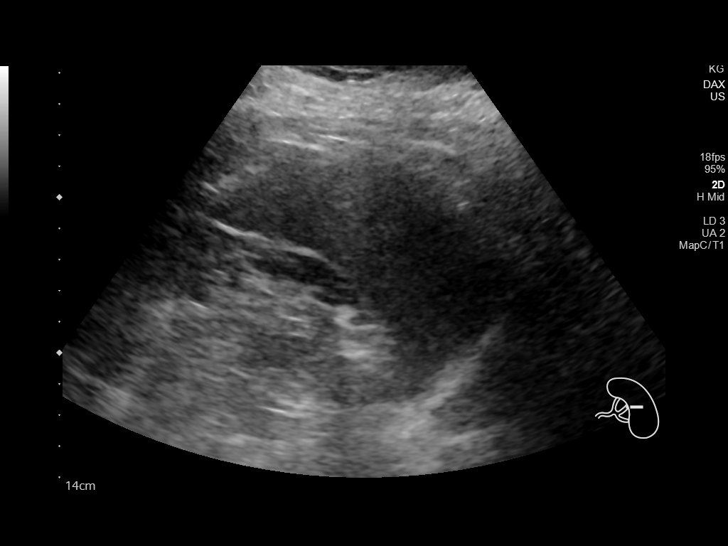
[im 64/96]
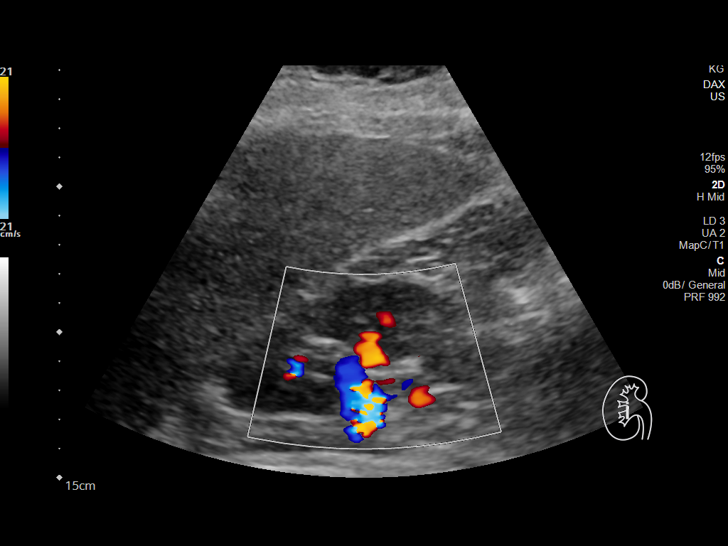
[im 72/96]
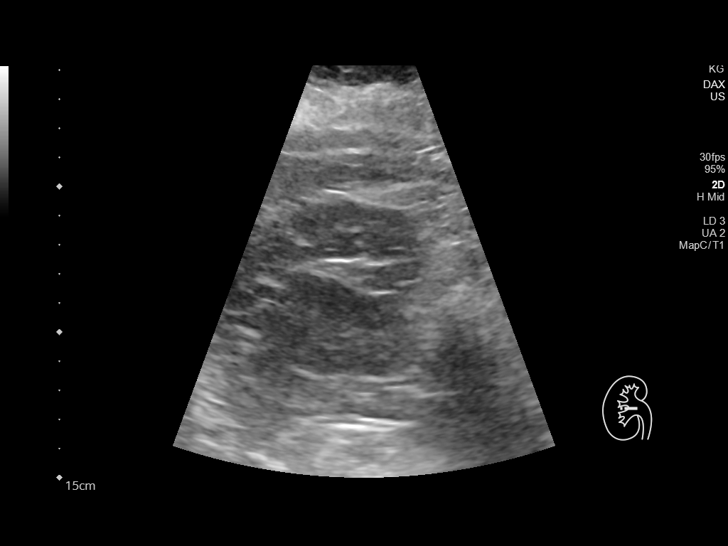
[im 80/96]
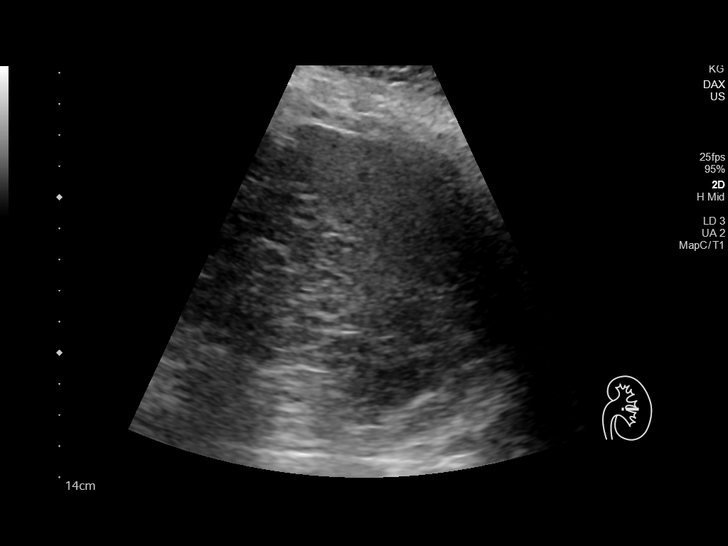
[im 88/96]
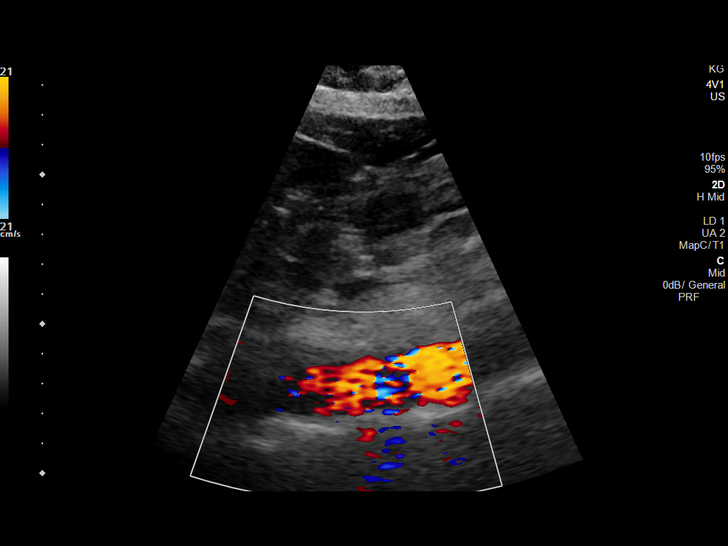
[im 96/96]
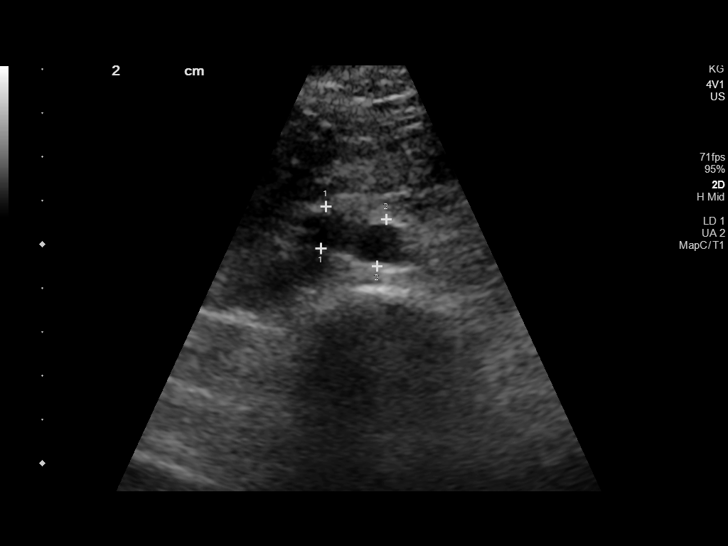

[13 of 25 positions shown; findings below may reference images not displayed]

FINDINGS: Gallbladder: No gallstones or wall thickening visualized. There is
no pericholecystic fluid. No sonographic Murphy sign noted by
sonographer.

Common bile duct: Diameter: 5 mm. No intrahepatic, common hepatic,
or common bile duct dilatation.

Liver: No focal lesion identified. The liver echogenicity is
increased diffusely. Portal vein is patent on color Doppler imaging
with normal direction of blood flow towards the liver.

IVC: No abnormality visualized.

Pancreas: Visualized portion unremarkable. Portions of pancreas
obscured by gas.

Spleen: Size and appearance within normal limits.

Right Kidney: Length: 11.5 cm. Echogenicity within normal limits. No
mass or hydronephrosis visualized.

Left Kidney: Length: 12.6 cm. Echogenicity within normal limits. No
mass or hydronephrosis visualized.

Abdominal aorta: No aneurysm visualized.

Other findings: No demonstrable ascites.
IMPRESSION: 1. Diffuse increase in liver echogenicity, a finding indicative of
hepatic steatosis. No focal liver lesions are evident; it must be
cautioned that the sensitivity of ultrasound for detection of focal
liver lesions is somewhat diminished in this circumstance.

2. Portions of pancreas obscured by gas. Visualized portions of
pancreas appear unremarkable.

3.  Study otherwise unremarkable.

## 2021-09-29 ENCOUNTER — Other Ambulatory Visit (INDEPENDENT_AMBULATORY_CARE_PROVIDER_SITE_OTHER): Payer: Self-pay | Admitting: Primary Care

## 2021-09-29 DIAGNOSIS — Z760 Encounter for issue of repeat prescription: Secondary | ICD-10-CM

## 2021-09-29 DIAGNOSIS — I10 Essential (primary) hypertension: Secondary | ICD-10-CM

## 2021-10-07 ENCOUNTER — Other Ambulatory Visit: Payer: Self-pay

## 2021-11-01 ENCOUNTER — Ambulatory Visit (INDEPENDENT_AMBULATORY_CARE_PROVIDER_SITE_OTHER): Payer: Self-pay | Admitting: Primary Care

## 2021-11-29 ENCOUNTER — Other Ambulatory Visit: Payer: Self-pay

## 2022-01-02 ENCOUNTER — Other Ambulatory Visit: Payer: Self-pay

## 2022-01-03 ENCOUNTER — Emergency Department (HOSPITAL_COMMUNITY): Payer: Self-pay

## 2022-01-03 ENCOUNTER — Other Ambulatory Visit: Payer: Self-pay

## 2022-01-03 ENCOUNTER — Emergency Department (HOSPITAL_COMMUNITY)
Admission: EM | Admit: 2022-01-03 | Discharge: 2022-01-03 | Disposition: A | Discharge status: Home / Self Care | Payer: Self-pay | Attending: Emergency Medicine | Admitting: Emergency Medicine

## 2022-01-03 DIAGNOSIS — Z79899 Other long term (current) drug therapy: Secondary | ICD-10-CM | POA: Insufficient documentation

## 2022-01-03 DIAGNOSIS — R42 Dizziness and giddiness: Secondary | ICD-10-CM | POA: Insufficient documentation

## 2022-01-03 DIAGNOSIS — R202 Paresthesia of skin: Secondary | ICD-10-CM | POA: Insufficient documentation

## 2022-01-03 DIAGNOSIS — R7989 Other specified abnormal findings of blood chemistry: Secondary | ICD-10-CM | POA: Insufficient documentation

## 2022-01-03 DIAGNOSIS — Z7984 Long term (current) use of oral hypoglycemic drugs: Secondary | ICD-10-CM | POA: Insufficient documentation

## 2022-01-03 DIAGNOSIS — E1165 Type 2 diabetes mellitus with hyperglycemia: Secondary | ICD-10-CM | POA: Insufficient documentation

## 2022-01-03 DIAGNOSIS — I10 Essential (primary) hypertension: Secondary | ICD-10-CM | POA: Insufficient documentation

## 2022-01-03 LAB — COMPREHENSIVE METABOLIC PANEL
ALT: 304 U/L — ABNORMAL HIGH (ref 0–44)
AST: 345 U/L — ABNORMAL HIGH (ref 15–41)
Albumin: 3.5 g/dL (ref 3.5–5.0)
Alkaline Phosphatase: 138 U/L — ABNORMAL HIGH (ref 38–126)
Anion gap: 10 (ref 5–15)
BUN: 6 mg/dL (ref 6–20)
CO2: 23 mmol/L (ref 22–32)
Calcium: 8.5 mg/dL — ABNORMAL LOW (ref 8.9–10.3)
Chloride: 98 mmol/L (ref 98–111)
Creatinine, Ser: 0.58 mg/dL (ref 0.44–1.00)
GFR, Estimated: >60 mL/min (ref >60)
Glucose, Bld: 245 mg/dL — ABNORMAL HIGH (ref 70–99)
Potassium: 3.4 mmol/L — ABNORMAL LOW (ref 3.5–5.1)
Sodium: 131 mmol/L — ABNORMAL LOW (ref 135–145)
Total Bilirubin: 0.6 mg/dL (ref 0.3–1.2)
Total Protein: 7 g/dL (ref 6.5–8.1)

## 2022-01-03 LAB — URINALYSIS, ROUTINE W REFLEX MICROSCOPIC
Bacteria, UA: NONE SEEN
Bilirubin Urine: NEGATIVE
Glucose, UA: >=500 mg/dL — AB
Hgb urine dipstick: NEGATIVE
Ketones, ur: 20 mg/dL — AB
Leukocytes,Ua: NEGATIVE
Nitrite: NEGATIVE
Protein, ur: NEGATIVE mg/dL
Specific Gravity, Urine: 1.014 (ref 1.005–1.030)
pH: 7 (ref 5.0–8.0)

## 2022-01-03 LAB — CBC WITH DIFFERENTIAL/PLATELET
Abs Immature Granulocytes: 0.03 10*3/uL (ref 0.00–0.07)
Basophils Absolute: 0 10*3/uL (ref 0.0–0.1)
Basophils Relative: 0 %
Eosinophils Absolute: 0 10*3/uL (ref 0.0–0.5)
Eosinophils Relative: 0 %
HCT: 41.3 % (ref 36.0–46.0)
Hemoglobin: 13.5 g/dL (ref 12.0–15.0)
Immature Granulocytes: 0 %
Lymphocytes Relative: 20 %
Lymphs Abs: 1.5 10*3/uL (ref 0.7–4.0)
MCH: 30.4 pg (ref 26.0–34.0)
MCHC: 32.7 g/dL (ref 30.0–36.0)
MCV: 93 fL (ref 80.0–100.0)
Monocytes Absolute: 0.5 10*3/uL (ref 0.1–1.0)
Monocytes Relative: 6 %
Neutro Abs: 5.4 10*3/uL (ref 1.7–7.7)
Neutrophils Relative %: 74 %
Platelets: 261 10*3/uL (ref 150–400)
RBC: 4.44 MIL/uL (ref 3.87–5.11)
RDW: 13.8 % (ref 11.5–15.5)
WBC: 7.4 10*3/uL (ref 4.0–10.5)
nRBC: 0 % (ref 0.0–0.2)

## 2022-01-03 LAB — I-STAT BETA HCG BLOOD, ED (MC, WL, AP ONLY): I-stat hCG, quantitative: <5 m[IU]/mL (ref <5)

## 2022-01-03 LAB — CBG MONITORING, ED: Glucose-Capillary: 264 mg/dL — ABNORMAL HIGH (ref 70–99)

## 2022-01-03 MED ORDER — SODIUM CHLORIDE 0.9 % IV BOLUS
1000.0000 mL | Freq: Once | INTRAVENOUS | Status: AC
  Administered 2022-01-03: 1000 mL via INTRAVENOUS

## 2022-01-03 NOTE — ED Provider Notes (Signed)
[facility] EMERGENCY DEPARTMENT Provider Note   CSN: [id number] Arrival date & time: 01/03/22  1021     History HTN Chief Complaint  Patient presents with   Hypertension    Tara [name] is a 41 y.o. female.  41 year old female with a past medical history of hypertension presents to the ED via EMS status post episode of dizziness.  Patient reports she drove her children to school, upon returning to school she stopped by grocery store and ate some cheese cake along with a Coke, she began to feel dizzy suddenly like the entire room was spinning and her arms were tingling.  Ports having breakfast after this and taking her blood pressure medication along with metformin and glipizide but symptoms persisted.  She is borderline diabetic, reports she was given a prescription for Trulicity however has not taking.  She denies any headache, no chest pain, no shortness of breath.  No recent sick contacts, no fevers or diarrhea.  The history is provided by the patient.  Hypertension This is a recurrent problem. Pertinent negatives include no chest pain, no abdominal pain and no shortness of breath.       Home Medications Prior to Admission medications   Medication Sig Start Date End Date Taking? Authorizing Provider  Dulaglutide (TRULICITY) 0.75 MG/0.5ML SOPN Inject 0.75 mg into the skin once a week. 08/02/21   Grayce Sessions, NP  fluconazole (DIFLUCAN) 150 MG tablet Take 1 tablet (150 mg total) by mouth daily. 08/05/21   Grayce Sessions, NP  glipiZIDE (GLUCOTROL) 10 MG tablet Take 1 tablet (10 mg total) by mouth 2 (two) times daily before a meal. 08/02/21   Grayce Sessions, NP  lisinopril (ZESTRIL) 20 MG tablet Take 1 tablet (20 mg total) by mouth daily. 08/02/21   Grayce Sessions, NP  metFORMIN (GLUCOPHAGE) 1000 MG tablet Take 1 tablet (1,000 mg total) by mouth 2 (two) times daily with a meal. 08/02/21   Grayce Sessions, NP      Allergies     Amoxicillin    Review of Systems   Review of Systems  Constitutional:  Negative for chills and fever.  HENT:  Negative for sore throat.   Respiratory:  Negative for shortness of breath.   Cardiovascular:  Negative for chest pain.  Gastrointestinal:  Negative for abdominal pain, nausea and vomiting.  Genitourinary:  Negative for flank pain.  Musculoskeletal:  Negative for back pain.  Neurological:  Positive for dizziness.  All other systems reviewed and are negative.   Physical Exam Updated Vital Signs BP (!) 140/91   Pulse 81   Temp 98.1 F (36.7 C) (Oral)   Resp (!) 21   Ht 5' 2.99" (1.6 m)   Wt 90.7 kg   SpO2 96%   BMI 35.44 kg/m  Physical Exam Vitals and nursing note reviewed.  Constitutional:      General: She is not in acute distress.    Appearance: She is well-developed.  HENT:     Head: Normocephalic and atraumatic.     Mouth/Throat:     Pharynx: No oropharyngeal exudate.  Eyes:     Pupils: Pupils are equal, round, and reactive to light.  Cardiovascular:     Rate and Rhythm: Regular rhythm.     Heart sounds: Normal heart sounds.  Pulmonary:     Effort: Pulmonary effort is normal. No respiratory distress.     Breath sounds: Normal breath sounds.  Abdominal:     General: Bowel sounds  are normal. There is no distension.     Palpations: Abdomen is soft.     Tenderness: There is no abdominal tenderness.  Musculoskeletal:        General: No tenderness or deformity.     Cervical back: Normal range of motion.     Right lower leg: No edema.     Left lower leg: No edema.  Skin:    General: Skin is warm and dry.  Neurological:     Mental Status: She is alert and oriented to person, place, and time.     ED Results / Procedures / Treatments   Labs (all labs ordered are listed, but only abnormal results are displayed) Labs Reviewed  COMPREHENSIVE METABOLIC PANEL - Abnormal; Notable for the following components:      Result Value   Sodium 131 (*)     Potassium 3.4 (*)    Glucose, Bld 245 (*)    Calcium 8.5 (*)    AST 345 (*)    ALT 304 (*)    Alkaline Phosphatase 138 (*)    All other components within normal limits  URINALYSIS, ROUTINE W REFLEX MICROSCOPIC - Abnormal; Notable for the following components:   Glucose, UA >=500 (*)    Ketones, ur 20 (*)    All other components within normal limits  CBG MONITORING, ED - Abnormal; Notable for the following components:   Glucose-Capillary 264 (*)    All other components within normal limits  CBC WITH DIFFERENTIAL/PLATELET  I-STAT BETA HCG BLOOD, ED ([facility], [facility], [facility] ONLY)    EKG EKG Interpretation  Date/Time:  Friday January 03 2022 10:52:54 EDT Ventricular Rate:  88 PR Interval:  146 QRS Duration: 94 QT Interval:  381 QTC Calculation: 461 R Axis:   41 Text Interpretation: Sinus rhythm Confirmed by Blanchie Dessert [phone]) on 01/03/2022 10:56:59 AM  Radiology No results found.  Procedures Procedures    Medications Ordered in ED Medications  sodium chloride 0.9 % bolus 1,000 mL (1,000 mLs Intravenous New Bag/Given 01/03/22 1423)    ED Course/ Medical Decision Making/ A&P                           Medical Decision Making Amount and/or Complexity of Data Reviewed Labs: ordered. Radiology: ordered.   This patient presents to the ED for concern of dizziness, this involves a number of treatment options, and is a complaint that carries with it a high risk of complications and morbidity.  The differential diagnosis includes ACS, pregnancy versus electrolyte derangement.    Co morbidities: Discussed in HPI   Brief History:  Patient here with episode of dizziness after dropping of her kids at school.  Reports he felt the entire room spinning and felt weak to both of her arms.  Similar episodes in the past, does have an ongoing history of hypertension and borderline diabetic, reports she did not take her medication today.  EMR reviewed including pt PMHx, past surgical  history and past visits to ER.   See HPI for more details   Lab Tests:  I ordered and independently interpreted labs.  The pertinent results include:    I personally reviewed all laboratory work and imaging. Metabolic panel without any acute abnormality specifically kidney function within normal limits and no significant electrolyte abnormalities. CBC without leukocytosis or significant anemia.  LFTs noted to be elevated around 300 level, similar labs approximately 3 years ago, and when patient was diagnosed with a  fatty liver.  Did have an ultrasound at that time.  She is here without any abdominal pain, I do not suspect that we would need to repeat these at this time.  She denies any Tylenol use, no alcohol abuse.   Imaging Studies:  NAD. I personally reviewed all imaging studies and no acute abnormality found. I agree with radiology interpretation.    Cardiac Monitoring:  The patient was maintained on a cardiac monitor.  I personally viewed and interpreted the cardiac monitored which showed an underlying rhythm of: NSR 88 EKG non-ischemic   Medicines ordered:  I ordered medication including bolus  for symptomatic treatment Reevaluation of the patient after these medicines showed that the patient improved I have reviewed the patients home medicines and have made adjustments as needed  Reevaluation:  After the interventions noted above I re-evaluated patient and found that they have :resolved   Social Determinants of Health:  The patient's social determinants of health were a factor in the care of this patient    Problem List / ED Course:  And here with dizziness, arrived to the ED in stable condition.  Reports similar episodes in the past.  Prior history of hypertension however blood pressure has been stable here slightly elevated but nothing to suspect urgency versus emergency.  She is without any chest pain or shortness of breath.  Labs were unremarkable no electrolyte  derangement, creatinine level is within normal limits.  Pregnancy test is negative.  EKG was normal sinus rhythm.  She does report improvement in symptoms spontaneously after arrival in the ED.  Given bolus to help with hydration.  He is without any chest pain, no prior history of cardiac disease I have a low suspicion for ACS at this time.  No trauma, no changes in gait after being ambulated to suggest intracranial etiology.  No hypoxia, no tachycardia to suggest pulmonary embolism Viewed of record does show a prior normal ultrasound.  Be repeating this at this time after serial abdominal exams patient without any abdominal pain, no nausea, no vomiting or fevers to suggest gallbladder etiology. Sugars elevated at 245, she does have diabetes medication at home but has not been taking it, did discuss with her taking it at this time.  Not in DKA.   Dispostion:  After consideration of the diagnostic results and the patients response to treatment, I feel that the patent would benefit from outpatient follow-up with PCP, will also need to continue to take diabetes medication.    Portions of this note were generated with Scientist, clinical (histocompatibility and immunogenetics). Dictation errors may occur despite best attempts at proofreading.  Final Clinical Impression(s) / ED Diagnoses Final diagnoses:  Dizziness    Rx / DC Orders ED Discharge Orders     None         Claude Manges, PA-C 01/03/22 1502    Gwyneth Sprout, MD 01/03/22 1527

## 2022-01-03 NOTE — Discharge Instructions (Addendum)
Sus examenes de sangre fueron normales excepto los del higado. Estos niveles estan elevados.  Por favor NO tome tylenol o alcohol. Si tiene Clinical cytogeneticist regrese a la sala de Freight forward[employer].

## 2022-01-03 NOTE — ED Triage Notes (Signed)
BIB [facility] from home. Was dizzy after waking. This has occurred before because of high BP and high blood sugar. Pt non compliant with medication at home. But took her medications this morning lisinopril, metformin and glipizide. Has prescription of trulicity but is not filled.   Ems vitals Bp- 160/100 Hr 90 O2 98% T- 98 Rr- 20 Cbg- 358

## 2022-01-07 ENCOUNTER — Ambulatory Visit (INDEPENDENT_AMBULATORY_CARE_PROVIDER_SITE_OTHER): Payer: Self-pay | Admitting: Primary Care

## 2022-01-08 ENCOUNTER — Ambulatory Visit (INDEPENDENT_AMBULATORY_CARE_PROVIDER_SITE_OTHER): Payer: Self-pay | Admitting: Primary Care

## 2022-01-08 ENCOUNTER — Encounter (INDEPENDENT_AMBULATORY_CARE_PROVIDER_SITE_OTHER): Payer: Self-pay | Admitting: Primary Care

## 2022-01-08 ENCOUNTER — Other Ambulatory Visit: Payer: Self-pay

## 2022-01-08 VITALS — BP 139/85 | HR 80 | Resp 16 | Wt 202.4 lb

## 2022-01-08 DIAGNOSIS — Z6835 Body mass index (BMI) 35.0-35.9, adult: Secondary | ICD-10-CM

## 2022-01-08 DIAGNOSIS — E6609 Other obesity due to excess calories: Secondary | ICD-10-CM

## 2022-01-08 DIAGNOSIS — I10 Essential (primary) hypertension: Secondary | ICD-10-CM

## 2022-01-08 DIAGNOSIS — E1101 Type 2 diabetes mellitus with hyperosmolarity with coma: Secondary | ICD-10-CM

## 2022-01-08 DIAGNOSIS — J9801 Acute bronchospasm: Secondary | ICD-10-CM

## 2022-01-08 DIAGNOSIS — Z760 Encounter for issue of repeat prescription: Secondary | ICD-10-CM

## 2022-01-08 LAB — GLUCOSE, POCT (MANUAL RESULT ENTRY): POC Glucose: 170 mg/dl — AB (ref 70–99)

## 2022-01-08 LAB — POCT GLYCOSYLATED HEMOGLOBIN (HGB A1C): HbA1c, POC (controlled diabetic range): 11.3 % — AB (ref 0.0–7.0)

## 2022-01-08 MED ORDER — LISINOPRIL 20 MG PO TABS
20.0000 mg | ORAL_TABLET | Freq: Every day | ORAL | 1 refills | Status: DC
  Filled 2022-01-08: qty 90, 90d supply, fill #0
  Filled 2022-02-26: qty 30, 30d supply, fill #0
  Filled 2022-05-12: qty 30, 30d supply, fill #1
  Filled 2022-07-02: qty 30, 30d supply, fill #2
  Filled 2022-09-22: qty 30, 30d supply, fill #3
  Filled 2022-11-28: qty 30, 30d supply, fill #4
  Filled 2022-12-29: qty 30, 30d supply, fill #5

## 2022-01-08 MED ORDER — TRULICITY 0.75 MG/0.5ML ~~LOC~~ SOAJ
0.7500 mg | SUBCUTANEOUS | 4 refills | Status: DC
  Filled 2022-01-08 – 2022-02-26 (×2): qty 2, 28d supply, fill #0
  Filled 2022-05-12: qty 2, 28d supply, fill #1
  Filled 2022-07-02: qty 2, 28d supply, fill #2
  Filled 2022-09-22: qty 2, 28d supply, fill #3
  Filled 2022-11-28: qty 2, 28d supply, fill #4

## 2022-01-08 MED ORDER — GLIPIZIDE 10 MG PO TABS
10.0000 mg | ORAL_TABLET | Freq: Two times a day (BID) | ORAL | 1 refills | Status: DC
  Filled 2022-01-08: qty 180, 90d supply, fill #0
  Filled 2022-02-26: qty 60, 30d supply, fill #0
  Filled 2022-05-12: qty 60, 30d supply, fill #1
  Filled 2022-07-02: qty 60, 30d supply, fill #2
  Filled 2022-09-22: qty 60, 30d supply, fill #3
  Filled 2022-11-28: qty 60, 30d supply, fill #4
  Filled 2022-12-30: qty 60, 30d supply, fill #5

## 2022-01-08 MED ORDER — ALBUTEROL SULFATE HFA 108 (90 BASE) MCG/ACT IN AERS
2.0000 | INHALATION_SPRAY | Freq: Four times a day (QID) | RESPIRATORY_TRACT | 2 refills | Status: DC | PRN
  Filled 2022-01-08: qty 6.7, 25d supply, fill #0

## 2022-01-08 MED ORDER — METFORMIN HCL 1000 MG PO TABS
1000.0000 mg | ORAL_TABLET | Freq: Two times a day (BID) | ORAL | 1 refills | Status: DC
  Filled 2022-01-08: qty 180, 90d supply, fill #0
  Filled 2022-02-26: qty 60, 30d supply, fill #0
  Filled 2022-05-12: qty 60, 30d supply, fill #1
  Filled 2022-07-02: qty 60, 30d supply, fill #2
  Filled 2022-09-22: qty 60, 30d supply, fill #3
  Filled 2022-11-28: qty 60, 30d supply, fill #4
  Filled 2022-12-30: qty 60, 30d supply, fill #5

## 2022-01-08 NOTE — Progress Notes (Signed)
Tara [name], is a 41 y.o. female  BVQ:[id number]  CMK:[id number]  DOB - Feb 26, 1981  Chief Complaint  Patient presents with   Diabetes       Subjective:   Tara [name] is a 41 y.o. Hispanic (interpreter Wilhemena Durie [phone]) obese female here today for a follow upon HTN. Patient has No headache, No chest pain, No abdominal pain - No Nausea, No new weakness tingling or numbness, No Cough -. Type 2 diabetes denies increase urination , thirst , hunger  or vision changes. She has notice at night she get short of breath when she lying down to go to sleep but states only happen twice. She feels that this was related to fumes she was cleaning and painting.   No problems updated.  Allergies  Allergen Reactions   Amoxicillin Hives    Past Medical History:  Diagnosis Date   Elevated LFTs    Hypertension    no meds    Current Outpatient Medications on File Prior to Visit  Medication Sig Dispense Refill   fluconazole (DIFLUCAN) 150 MG tablet Take 1 tablet (150 mg total) by mouth daily. (Patient not taking: Reported on 01/08/2022) 1 tablet 1   No current facility-administered medications on file prior to visit.    Objective:   Vitals:   01/08/22 1350  BP: 139/85  Pulse: 80  Resp: 16  SpO2: 97%  Weight: 202 lb 6.4 oz (91.8 kg)    Exam General appearance : Awake, obese female , alert, not in any distress. Speech Clear. Not toxic looking HEENT: Atraumatic and Normocephalic, pupils equally reactive to light and accomodation Neck: Supple, no JVD. No cervical lymphadenopathy.  Chest: Good air entry bilaterally, no added sounds  CVS: S1 S2 regular, no murmurs.  Abdomen: Bowel sounds present, Non tender and not distended with no gaurding, rigidity or rebound. Extremities: B/L Lower Ext shows no edema, both legs are warm to touch Neurology: Awake alert, and oriented X 3, Non focal Skin: No Rash  Data Review Lab Results  Component Value  Date   HGBA1C 11.3 (A) 01/08/2022   HGBA1C 11.5 (A) 08/02/2021    Assessment & Plan  Tara [name] was seen today for diabetes.  Diagnoses and all orders for this visit:  Type 2 diabetes mellitus with hyperosmolar coma, unspecified whether long term insulin use ([place]) Started Trulicity last week prescribed 5 months ago. She was trying lifestyle modification by exercising and monitoring food but explained was not enough with a A1C of 11. Discussed  co- morbidities with uncontrol diabetes  Complications -diabetic retinopathy, (close your eyes ? What do you see nothing) nephropathy decrease in kidney function- can lead to dialysis-on a machine 3 days a week to filter your kidney, neuropathy- numbness and tinging in your hands and feet,  increase risk of heart attack and stroke, and amputation due to decrease wound healing and circulation. Decrease your risk by taking medication daily as prescribed, monitor carbohydrates- foods that are high in carbohydrates are the following rice, potatoes, breads, sugars, and pastas.  Reduction in the intake (eating) will assist in lowering your blood sugars. Exercise daily at least 30 minutes daily. -     Microalbumin / creatinine urine ratio -     POCT glucose (manual entry) -     POCT glycosylated hemoglobin (Hb A1C) -     Lipid panel -     CBC with Differential/Platelet -     CMP14+EGFR  Essential hypertension BP goal - < 130/80 Explained  that having normal blood pressure is the goal and medications are helping to get to goal and maintain normal blood pressure. DIET: Limit salt intake, read nutrition labels to check salt content, limit fried and high fatty foods  Avoid using multisymptom OTC cold preparations that generally contain sudafed which can rise BP. Consult with pharmacist on best cold relief products to use for persons with HTN EXERCISE Discussed incorporating exercise such as walking - 30 minutes most days of the week and can do in 10 minute intervals     -     CMP14+EGFR  Medication refill -     metFORMIN (GLUCOPHAGE) 1000 MG tablet; Take 1 tablet (1,000 mg total) by mouth 2 (two) times daily with a meal. -     lisinopril (ZESTRIL) 20 MG tablet; Take 1 tablet (20 mg total) by mouth daily. -     glipiZIDE (GLUCOTROL) 10 MG tablet; Take 1 tablet (10 mg total) by mouth 2 (two) times daily before a meal. -     Dulaglutide (TRULICITY) 8.86 YG/4.7UW SOPN; Inject 0.75 mg into the skin once a week.  Obesity due to excess calories without serious comorbidity, unspecified classification Obesity is 30-39 indicating an excess in caloric intake or underlining conditions. This may lead to other co-morbidities. Lifestyle modifications of diet and exercise may reduce obesity.    Bronchospasm -     albuterol (VENTOLIN HFA) 108 (90 Base) MCG/ACT inhaler; Inhale 2 puffs into the lungs every 6 (six) hours as needed for wheezing or shortness of breath.     Patient have been counseled extensively about nutrition and exercise. Other issues discussed during this visit include: low cholesterol diet, weight control and daily exercise, foot care, annual eye examinations at Ophthalmology, importance of adherence with medications and regular follow-up. We also discussed long term complications of uncontrolled diabetes and hypertension.   Return in about 3 months (around 04/09/2022) for PCP.  The patient was given clear instructions to go to ER or return to medical center if symptoms don't improve, worsen or new problems develop. The patient verbalized understanding. The patient was told to call to get lab results if they haven't heard anything in the next week.   This note has been created with Surveyor, quantity. Any transcriptional errors are unintentional.   Kerin Perna, NP 01/08/2022, 2:28 PM

## 2022-01-10 LAB — MICROALBUMIN / CREATININE URINE RATIO
Creatinine, Urine: 50.9 mg/dL
Microalb/Creat Ratio: 105 mg/g creat — ABNORMAL HIGH (ref 0–29)
Microalbumin, Urine: 53.5 ug/mL

## 2022-01-10 LAB — CBC WITH DIFFERENTIAL/PLATELET
Basophils Absolute: 0 10*3/uL (ref 0.0–0.2)
Basos: 0 %
EOS (ABSOLUTE): 0.1 10*3/uL (ref 0.0–0.4)
Eos: 1 %
Hematocrit: 43.7 % (ref 34.0–46.6)
Hemoglobin: 14.5 g/dL (ref 11.1–15.9)
Immature Grans (Abs): 0 10*3/uL (ref 0.0–0.1)
Immature Granulocytes: 1 %
Lymphocytes Absolute: 2.1 10*3/uL (ref 0.7–3.1)
Lymphs: 32 %
MCH: 30.9 pg (ref 26.6–33.0)
MCHC: 33.2 g/dL (ref 31.5–35.7)
MCV: 93 fL (ref 79–97)
Monocytes Absolute: 0.5 10*3/uL (ref 0.1–0.9)
Monocytes: 7 %
Neutrophils Absolute: 4 10*3/uL (ref 1.4–7.0)
Neutrophils: 59 %
Platelets: 304 10*3/uL (ref 150–450)
RBC: 4.7 x10E6/uL (ref 3.77–5.28)
RDW: 13.3 % (ref 11.7–15.4)
WBC: 6.6 10*3/uL (ref 3.4–10.8)

## 2022-01-10 LAB — CMP14+EGFR
ALT: 643 IU/L (ref 0–32)
AST: 609 IU/L (ref 0–40)
Albumin/Globulin Ratio: 1.4 (ref 1.2–2.2)
Albumin: 4.6 g/dL (ref 3.9–4.9)
Alkaline Phosphatase: 178 IU/L — ABNORMAL HIGH (ref 44–121)
BUN/Creatinine Ratio: 15 (ref 9–23)
BUN: 8 mg/dL (ref 6–24)
Bilirubin Total: 0.4 mg/dL (ref 0.0–1.2)
CO2: 22 mmol/L (ref 20–29)
Calcium: 9.5 mg/dL (ref 8.7–10.2)
Chloride: 98 mmol/L (ref 96–106)
Creatinine, Ser: 0.54 mg/dL — ABNORMAL LOW (ref 0.57–1.00)
Globulin, Total: 3.3 g/dL (ref 1.5–4.5)
Glucose: 160 mg/dL — ABNORMAL HIGH (ref 70–99)
Potassium: 4.1 mmol/L (ref 3.5–5.2)
Sodium: 137 mmol/L (ref 134–144)
Total Protein: 7.9 g/dL (ref 6.0–8.5)
eGFR: 119 mL/min/{1.73_m2} (ref >59)

## 2022-01-10 LAB — LIPID PANEL
Chol/HDL Ratio: 6.7 ratio — ABNORMAL HIGH (ref 0.0–4.4)
Cholesterol, Total: 296 mg/dL — ABNORMAL HIGH (ref 100–199)
HDL: 44 mg/dL (ref >39)
LDL Chol Calc (NIH): 225 mg/dL — ABNORMAL HIGH (ref 0–99)
Triglycerides: 145 mg/dL (ref 0–149)
VLDL Cholesterol Cal: 27 mg/dL (ref 5–40)

## 2022-01-12 ENCOUNTER — Other Ambulatory Visit (INDEPENDENT_AMBULATORY_CARE_PROVIDER_SITE_OTHER): Payer: Self-pay | Admitting: Primary Care

## 2022-01-12 DIAGNOSIS — K760 Fatty (change of) liver, not elsewhere classified: Secondary | ICD-10-CM

## 2022-01-12 MED ORDER — ATORVASTATIN CALCIUM 40 MG PO TABS: 40.0000 mg | ORAL_TABLET | Freq: Every day | ORAL | 3 refills | Status: DC

## 2022-01-17 ENCOUNTER — Encounter (INDEPENDENT_AMBULATORY_CARE_PROVIDER_SITE_OTHER): Payer: Self-pay

## 2022-02-26 ENCOUNTER — Ambulatory Visit
Admission: EM | Admit: 2022-02-26 | Discharge: 2022-02-26 | Disposition: A | Discharge status: Home / Self Care | Payer: Self-pay | Attending: Physician Assistant | Admitting: Physician Assistant

## 2022-02-26 ENCOUNTER — Encounter: Payer: Self-pay | Admitting: Physician Assistant

## 2022-02-26 ENCOUNTER — Other Ambulatory Visit (INDEPENDENT_AMBULATORY_CARE_PROVIDER_SITE_OTHER): Payer: Self-pay | Admitting: Primary Care

## 2022-02-26 ENCOUNTER — Other Ambulatory Visit: Payer: Self-pay

## 2022-02-26 DIAGNOSIS — J9801 Acute bronchospasm: Secondary | ICD-10-CM

## 2022-02-26 DIAGNOSIS — J069 Acute upper respiratory infection, unspecified: Secondary | ICD-10-CM

## 2022-02-26 MED ORDER — ALBUTEROL SULFATE HFA 108 (90 BASE) MCG/ACT IN AERS: 2.0000 | INHALATION_SPRAY | Freq: Four times a day (QID) | RESPIRATORY_TRACT | 0 refills | Status: DC | PRN

## 2022-02-26 MED ORDER — PROMETHAZINE-DM 6.25-15 MG/5ML PO SYRP
5.0000 mL | ORAL_SOLUTION | Freq: Four times a day (QID) | ORAL | 0 refills | Status: DC | PRN
  Filled 2022-02-26: qty 118, 6d supply, fill #0

## 2022-02-26 MED ORDER — ALBUTEROL SULFATE HFA 108 (90 BASE) MCG/ACT IN AERS
2.0000 | INHALATION_SPRAY | Freq: Four times a day (QID) | RESPIRATORY_TRACT | 0 refills | Status: DC | PRN
  Filled 2022-02-26: qty 6.7, 25d supply, fill #0

## 2022-02-26 NOTE — ED Provider Notes (Signed)
[name]    CSN: [id number] Arrival date & time: 02/26/22  0840      History   Chief Complaint Chief Complaint  Patient presents with   Cough    HPI Tara [name] is a 41 y.o. female.   Patient here today for evaluation of productive cough that she has had for 3 days.  She also reports some intermittent wheezing.  She has not had fever.  She does report some headache.  She denies any ear pain or sore throat.  She has not had any nausea, vomiting or diarrhea.  She was prescribed inhaler in the past but states she has not been using same.  She denies history of asthma.  The history is provided by the patient. The history is limited by a language barrier. Language interpreter usedShanda [phone].    Past Medical History:  Diagnosis Date   Elevated LFTs    Hypertension    no meds    Patient Active Problem List   Diagnosis Date Noted   Fatty liver 05/16/2019   Encounter for IUD removal 12/08/2016   Contraception management 06/13/2016   Elevated blood pressure reading 06/13/2016   Preeclampsia, third trimester 03/25/2016   GERD (gastroesophageal reflux disease) 03/05/2016   Previous cesarean delivery, antepartum condition or complication 02/04/2016   Encounter for supervision of normal pregnancy 11/27/2015    Past Surgical History:  Procedure Laterality Date   CESAREAN SECTION     THERAPEUTIC ABORTION      OB History     Gravida  4   Para  3   Term  3   Preterm  0   AB  1   Living  3      SAB  0   IAB  1   Ectopic  0   Multiple  0   Live Births  3            Home Medications    Prior to Admission medications   Medication Sig Start Date End Date Taking? Authorizing Provider  promethazine-dextromethorphan (PROMETHAZINE-DM) 6.25-15 MG/5ML syrup Take 5 mLs by mouth 4 (four) times daily as needed for cough. 02/26/22  Yes Tomi Bamberger, PA-C  albuterol (VENTOLIN HFA) 108 (90 Base) MCG/ACT inhaler Inhale 2 puffs  into the lungs every 6 (six) hours as needed for wheezing or shortness of breath. 02/26/22   Tomi Bamberger, PA-C  atorvastatin (LIPITOR) 40 MG tablet Take 1 tablet (40 mg total) by mouth daily. 01/12/22   Grayce Sessions, NP  Dulaglutide (TRULICITY) 0.75 MG/0.5ML SOPN Inject 0.75 mg into the skin once a week. 01/08/22   Grayce Sessions, NP  fluconazole (DIFLUCAN) 150 MG tablet Take 1 tablet (150 mg total) by mouth daily. Patient not taking: Reported on 01/08/2022 08/05/21   Grayce Sessions, NP  glipiZIDE (GLUCOTROL) 10 MG tablet Take 1 tablet (10 mg total) by mouth 2 (two) times daily before a meal. 01/08/22   Grayce Sessions, NP  lisinopril (ZESTRIL) 20 MG tablet Take 1 tablet (20 mg total) by mouth daily. 01/08/22   Grayce Sessions, NP  metFORMIN (GLUCOPHAGE) 1000 MG tablet Take 1 tablet (1,000 mg total) by mouth 2 (two) times daily with a meal. 01/08/22   Grayce Sessions, NP    Family History Family History  Problem Relation Age of Onset   Diabetes Mother    Hypertension Mother     Social History Social History   Tobacco Use   Smoking  status: Never   Smokeless tobacco: Never  Vaping Use   Vaping Use: Never used  Substance Use Topics   Alcohol use: No   Drug use: No     Allergies   Amoxicillin   Review of Systems Review of Systems  Constitutional:  Negative for chills and fever.  HENT:  Positive for congestion. Negative for ear pain and sore throat.   Eyes:  Negative for discharge and redness.  Respiratory:  Positive for cough and wheezing. Negative for shortness of breath.   Gastrointestinal:  Negative for diarrhea, nausea and vomiting.     Physical Exam Triage Vital Signs ED Triage Vitals  Enc Vitals Group     BP      Pulse      Resp      Temp      Temp src      SpO2      Weight      Height      Head Circumference      Peak Flow      Pain Score      Pain Loc      Pain Edu?      Excl. in GC?    No data found.  Updated Vital  Signs BP (!) 142/84 (BP Location: Left Arm)   Pulse 84   Temp 97.9 F (36.6 C) (Oral)   Resp 17   SpO2 93%     Physical Exam Vitals and nursing note reviewed.  Constitutional:      General: She is not in acute distress.    Appearance: Normal appearance. She is not ill-appearing.  HENT:     Head: Normocephalic and atraumatic.     Right Ear: Tympanic membrane normal.     Left Ear: Tympanic membrane normal.     Nose: Congestion present.     Mouth/Throat:     Mouth: Mucous membranes are moist.     Pharynx: No oropharyngeal exudate or posterior oropharyngeal erythema.  Eyes:     Conjunctiva/sclera: Conjunctivae normal.  Cardiovascular:     Rate and Rhythm: Normal rate and regular rhythm.     Heart sounds: Normal heart sounds. No murmur heard. Pulmonary:     Effort: Pulmonary effort is normal. No respiratory distress.     Breath sounds: Normal breath sounds. No wheezing, rhonchi or rales.  Skin:    General: Skin is warm and dry.  Neurological:     Mental Status: She is alert.  Psychiatric:        Mood and Affect: Mood normal.        Thought Content: Thought content normal.      UC Treatments / Results  Labs (all labs ordered are listed, but only abnormal results are displayed) Labs Reviewed - No data to display  EKG   Radiology No results found.  Procedures Procedures (including critical care time)  Medications Ordered in UC Medications - No data to display  Initial Impression / Assessment and Plan / UC Course  I have reviewed the triage vital signs and the nursing notes.  Pertinent labs & imaging results that were available during my care of the patient were reviewed by me and considered in my medical decision making (see chart for details).    Suspect viral etiology of symptoms. Patient declines covid screening. Will treat with albuterol refill, cough syrup, and recommended other symptomatic treatment if needed. Encouraged follow up if symptoms fail to  improve or worsen.   Final Clinical Impressions(s) /  UC Diagnoses   Final diagnoses:  Acute upper respiratory infection   Discharge Instructions   None    ED Prescriptions     Medication Sig Dispense Auth. Provider   albuterol (VENTOLIN HFA) 108 (90 Base) MCG/ACT inhaler Inhale 2 puffs into the lungs every 6 (six) hours as needed for wheezing or shortness of breath. 6.7 g Tomi Bamberger, PA-C   promethazine-dextromethorphan (PROMETHAZINE-DM) 6.25-15 MG/5ML syrup Take 5 mLs by mouth 4 (four) times daily as needed for cough. 118 mL Tomi Bamberger, PA-C      PDMP not reviewed this encounter.   Tomi Bamberger, PA-C 02/26/22 1128

## 2022-02-26 NOTE — ED Triage Notes (Signed)
Pt presents with productive cough X 3 days and some intermittent wheezing.

## 2022-02-28 ENCOUNTER — Other Ambulatory Visit: Payer: Self-pay

## 2022-03-25 ENCOUNTER — Encounter: Payer: Self-pay | Admitting: Primary Care

## 2022-04-04 ENCOUNTER — Ambulatory Visit: Payer: Self-pay | Admitting: Internal Medicine

## 2022-04-28 ENCOUNTER — Ambulatory Visit (INDEPENDENT_AMBULATORY_CARE_PROVIDER_SITE_OTHER): Payer: Self-pay | Admitting: Primary Care

## 2022-05-12 ENCOUNTER — Other Ambulatory Visit: Payer: Self-pay

## 2022-07-02 ENCOUNTER — Other Ambulatory Visit: Payer: Self-pay

## 2022-08-06 ENCOUNTER — Ambulatory Visit
Admission: EM | Admit: 2022-08-06 | Discharge: 2022-08-06 | Disposition: A | Discharge status: Home / Self Care | Payer: Self-pay | Attending: Internal Medicine | Admitting: Internal Medicine

## 2022-08-06 DIAGNOSIS — R112 Nausea with vomiting, unspecified: Secondary | ICD-10-CM | POA: Insufficient documentation

## 2022-08-06 DIAGNOSIS — Z20822 Contact with and (suspected) exposure to covid-19: Secondary | ICD-10-CM | POA: Insufficient documentation

## 2022-08-06 LAB — POCT URINALYSIS DIP (MANUAL ENTRY)
Glucose, UA: NEGATIVE mg/dL
Leukocytes, UA: NEGATIVE
Nitrite, UA: NEGATIVE
Protein Ur, POC: 100 mg/dL — AB
Spec Grav, UA: >=1.03 — AB (ref 1.010–1.025)
Urobilinogen, UA: 0.2 E.U./dL
pH, UA: 6 (ref 5.0–8.0)

## 2022-08-06 LAB — POCT FASTING CBG KUC MANUAL ENTRY: POCT Glucose (KUC): 168 mg/dL — AB (ref 70–99)

## 2022-08-06 MED ORDER — ONDANSETRON 4 MG PO TBDP: 4.0000 mg | ORAL_TABLET | Freq: Three times a day (TID) | ORAL | 0 refills | Status: DC | PRN

## 2022-08-06 NOTE — Discharge Instructions (Signed)
I have prescribed you a nausea medication to take as needed.  Ensure adequate fluid hydration and rest.  Follow-up with the ER if any symptoms persist or worsen.

## 2022-08-06 NOTE — ED Triage Notes (Signed)
Pt c/o headache, nausea,   Onset ~ 3 days ago

## 2022-08-06 NOTE — ED Provider Notes (Signed)
[name]    CSN: [id number] Arrival date & time: 08/06/22  1511      History   Chief Complaint Chief Complaint  Patient presents with   Headache    HPI Tara [name] is a 42 y.o. female.   Patient presents with headache, nausea, chills that started about 3 days ago.  Patient reports that she has had 1 episode of nonbloody emesis.  Denies diarrhea.  Last bowel movement was this morning as she denies blood in stool.  Denies any fevers.  Reports that she has been around someone that was possibly exposed to COVID-19.  Denies nasal congestion, runny nose, sore throat.  Last menstrual cycle was approximately 15 days ago.  Patient states that she has not taken her diabetes medications in approximately 1 week because she started feeling better and stopped taking them.  She has not checked her blood sugar recently either.  Denies any associated urinary symptoms. She has been able to tolerate fluids but not foods.    Headache   Past Medical History:  Diagnosis Date   Diabetes mellitus    type 2   Elevated LFTs    Fatty liver    Hypertension    no meds   Obesity     Patient Active Problem List   Diagnosis Date Noted   Fatty liver 05/16/2019   Encounter for IUD removal 12/08/2016   Contraception management 06/13/2016   Elevated blood pressure reading 06/13/2016   Preeclampsia, third trimester 03/25/2016   GERD (gastroesophageal reflux disease) 03/05/2016   Previous cesarean delivery, antepartum condition or complication 02/04/2016   Encounter for supervision of normal pregnancy 11/27/2015    Past Surgical History:  Procedure Laterality Date   CESAREAN SECTION     THERAPEUTIC ABORTION      OB History     Gravida  4   Para  3   Term  3   Preterm  0   AB  1   Living  3      SAB  0   IAB  1   Ectopic  0   Multiple  0   Live Births  3            Home Medications    Prior to Admission medications   Medication Sig Start  Date End Date Taking? Authorizing Provider  albuterol (VENTOLIN HFA) 108 (90 Base) MCG/ACT inhaler Inhale 2 puffs into the lungs every 6 (six) hours as needed for wheezing or shortness of breath. 02/26/22   Grayce Sessions, NP  atorvastatin (LIPITOR) 40 MG tablet Take 1 tablet (40 mg total) by mouth daily. 01/12/22   Grayce Sessions, NP  Dulaglutide (TRULICITY) 0.75 MG/0.5ML SOPN Inject 0.75 mg into the skin once a week. 01/08/22   Grayce Sessions, NP  fluconazole (DIFLUCAN) 150 MG tablet Take 1 tablet (150 mg total) by mouth daily. Patient not taking: Reported on 01/08/2022 08/05/21   Grayce Sessions, NP  glipiZIDE (GLUCOTROL) 10 MG tablet Take 1 tablet (10 mg total) by mouth 2 (two) times daily before a meal. 01/08/22   Grayce Sessions, NP  lisinopril (ZESTRIL) 20 MG tablet Take 1 tablet (20 mg total) by mouth daily. 01/08/22   Grayce Sessions, NP  metFORMIN (GLUCOPHAGE) 1000 MG tablet Take 1 tablet (1,000 mg total) by mouth 2 (two) times daily with a meal. 01/08/22   Grayce Sessions, NP  ondansetron (ZOFRAN-ODT) 4 MG disintegrating tablet Take 1 tablet (4 mg total)  by mouth every 8 (eight) hours as needed for nausea or vomiting. 08/06/22  Yes Gustavus Bryant, FNP  promethazine-dextromethorphan (PROMETHAZINE-DM) 6.25-15 MG/5ML syrup Take 5 mLs by mouth 4 (four) times daily as needed for cough. 02/26/22   Tomi Bamberger, PA-C    Family History Family History  Problem Relation Age of Onset   Diabetes Mother    Hypertension Mother     Social History Social History   Tobacco Use   Smoking status: Never   Smokeless tobacco: Never  Vaping Use   Vaping Use: Never used  Substance Use Topics   Alcohol use: No   Drug use: No     Allergies   Amoxicillin   Review of Systems Review of Systems Per HPI  Physical Exam Triage Vital Signs ED Triage Vitals  Enc Vitals Group     BP 08/06/22 1628 133/86     Pulse Rate 08/06/22 1628 88     Resp 08/06/22 1628 18      Temp 08/06/22 1628 98 F (36.7 C)     Temp Source 08/06/22 1628 Oral     SpO2 08/06/22 1628 95 %     Weight --      Height --      Head Circumference --      Peak Flow --      Pain Score 08/06/22 1627 6     Pain Loc --      Pain Edu? --      Excl. in GC? --    No data found.  Updated Vital Signs BP 133/86 (BP Location: Left Arm)   Pulse 88   Temp 98 F (36.7 C) (Oral)   Resp 18   SpO2 95%   Visual Acuity Right Eye Distance:   Left Eye Distance:   Bilateral Distance:    Right Eye Near:   Left Eye Near:    Bilateral Near:     Physical Exam Constitutional:      General: She is not in acute distress.    Appearance: Normal appearance. She is not toxic-appearing or diaphoretic.  HENT:     Head: Normocephalic and atraumatic.     Mouth/Throat:     Mouth: Mucous membranes are moist.     Pharynx: No posterior oropharyngeal erythema.  Eyes:     Extraocular Movements: Extraocular movements intact.     Conjunctiva/sclera: Conjunctivae normal.  Cardiovascular:     Rate and Rhythm: Normal rate and regular rhythm.     Pulses: Normal pulses.     Heart sounds: Normal heart sounds.  Pulmonary:     Effort: Pulmonary effort is normal. No respiratory distress.     Breath sounds: Normal breath sounds.  Abdominal:     General: Bowel sounds are normal. There is no distension.     Palpations: Abdomen is soft.     Tenderness: There is no abdominal tenderness.  Neurological:     General: No focal deficit present.     Mental Status: She is alert and oriented to person, place, and time. Mental status is at baseline.  Psychiatric:        Mood and Affect: Mood normal.        Behavior: Behavior normal.        Thought Content: Thought content normal.        Judgment: Judgment normal.      UC Treatments / Results  Labs (all labs ordered are listed, but only abnormal results are displayed) Labs Reviewed  POCT FASTING CBG [facility] MANUAL ENTRY - Abnormal; Notable for the following  components:      Result Value   POCT Glucose ([facility]) 168 (*)    All other components within normal limits  POCT URINALYSIS DIP (MANUAL ENTRY) - Abnormal; Notable for the following components:   Clarity, UA hazy (*)    Bilirubin, UA small (*)    Ketones, POC UA small (15) (*)    Spec Grav, UA >=1.030 (*)    Blood, UA trace-intact (*)    Protein Ur, POC =100 (*)    All other components within normal limits  SARS CORONAVIRUS 2 (TAT 6-24 HRS)  CBC  COMPREHENSIVE METABOLIC PANEL    EKG   Radiology No results found.  Procedures Procedures (including critical care time)  Medications Ordered in UC Medications - No data to display  Initial Impression / Assessment and Plan / UC Course  I have reviewed the triage vital signs and the nursing notes.  Pertinent labs & imaging results that were available during my care of the patient were reviewed by me and considered in my medical decision making (see chart for details).     Most likely viral illness given associated chills.  There are no signs of acute abdomen on exam so do not think that emergent evaluation is necessary.  Blood glucose was performed given patient has not taken her diabetes medications in approximately 1 week and to ensure that DKA was not a concern.  Glucose was elevated at 168 but not significantly elevated.  Urinalysis was completed that showed some abnormalities which is most likely due to dehydration.  No further concern for DKA or need for emergent evaluation at this time.  Will obtain CMP and CBC given UA findings and to ensure no worrisome etiology is present.  Will prescribe ondansetron to take as needed.  COVID test pending given possible close exposure.  Also advised pushing fluids and bland diet.  Advised to restart previous diabetes medications as well as to monitor blood glucose very closely at home.  Advised following up with primary care doctor for this.  Advised following up with the ER if any current symptoms  persist or worsen.  Patient verbalized understanding and is agreeable with plan. Final Clinical Impressions(s) / UC Diagnoses   Final diagnoses:  Nausea and vomiting, unspecified vomiting type  Encounter for laboratory testing for COVID-19 virus     Discharge Instructions      I have prescribed you a nausea medication to take as needed.  Ensure adequate fluid hydration and rest.  Follow-up with the ER if any symptoms persist or worsen.     ED Prescriptions     Medication Sig Dispense Auth. Provider   ondansetron (ZOFRAN-ODT) 4 MG disintegrating tablet Take 1 tablet (4 mg total) by mouth every 8 (eight) hours as needed for nausea or vomiting. 20 tablet [place], Acie Fredrickson, [place]      PDMP not reviewed this encounter.   Gustavus Bryant, [place] 08/06/22 [phone]

## 2022-08-07 LAB — CBC
Hematocrit: 44.4 % (ref 34.0–46.6)
Hemoglobin: 14.7 g/dL (ref 11.1–15.9)
MCH: 31.1 pg (ref 26.6–33.0)
MCHC: 33.1 g/dL (ref 31.5–35.7)
MCV: 94 fL (ref 79–97)
Platelets: 288 10*3/uL (ref 150–450)
RBC: 4.73 x10E6/uL (ref 3.77–5.28)
RDW: 13.1 % (ref 11.7–15.4)
WBC: 8.9 10*3/uL (ref 3.4–10.8)

## 2022-08-07 LAB — COMPREHENSIVE METABOLIC PANEL
ALT: 151 IU/L — ABNORMAL HIGH (ref 0–32)
AST: 238 IU/L — ABNORMAL HIGH (ref 0–40)
Albumin/Globulin Ratio: 1.2 (ref 1.2–2.2)
Albumin: 4.2 g/dL (ref 3.9–4.9)
Alkaline Phosphatase: 155 IU/L — ABNORMAL HIGH (ref 44–121)
BUN/Creatinine Ratio: 10 (ref 9–23)
BUN: 7 mg/dL (ref 6–24)
Bilirubin Total: 0.5 mg/dL (ref 0.0–1.2)
CO2: 19 mmol/L — ABNORMAL LOW (ref 20–29)
Calcium: 9.4 mg/dL (ref 8.7–10.2)
Chloride: 98 mmol/L (ref 96–106)
Creatinine, Ser: 0.71 mg/dL (ref 0.57–1.00)
Globulin, Total: 3.4 g/dL (ref 1.5–4.5)
Glucose: 162 mg/dL — ABNORMAL HIGH (ref 70–99)
Potassium: 4.4 mmol/L (ref 3.5–5.2)
Sodium: 136 mmol/L (ref 134–144)
Total Protein: 7.6 g/dL (ref 6.0–8.5)
eGFR: 110 mL/min/{1.73_m2} (ref >59)

## 2022-08-07 LAB — SARS CORONAVIRUS 2 (TAT 6-24 HRS): SARS Coronavirus 2: NEGATIVE

## 2022-09-22 ENCOUNTER — Other Ambulatory Visit: Payer: Self-pay

## 2022-11-28 ENCOUNTER — Other Ambulatory Visit: Payer: Self-pay

## 2022-12-29 ENCOUNTER — Other Ambulatory Visit: Payer: Self-pay

## 2022-12-30 ENCOUNTER — Other Ambulatory Visit: Payer: Self-pay

## 2023-01-05 ENCOUNTER — Other Ambulatory Visit: Payer: Self-pay

## 2023-03-04 ENCOUNTER — Other Ambulatory Visit: Payer: Self-pay

## 2023-03-11 ENCOUNTER — Other Ambulatory Visit: Payer: Self-pay

## 2023-03-11 ENCOUNTER — Ambulatory Visit
Admission: EM | Admit: 2023-03-11 | Discharge: 2023-03-11 | Disposition: A | Discharge status: Home / Self Care | Payer: Self-pay | Attending: Physician Assistant | Admitting: Physician Assistant

## 2023-03-11 ENCOUNTER — Ambulatory Visit: Admission: EM | Admit: 2023-03-11 | Discharge: 2023-03-11 | Discharge status: Against Medical Advice | Payer: Self-pay

## 2023-03-11 DIAGNOSIS — Z760 Encounter for issue of repeat prescription: Secondary | ICD-10-CM

## 2023-03-11 DIAGNOSIS — I10 Essential (primary) hypertension: Secondary | ICD-10-CM

## 2023-03-11 DIAGNOSIS — E1101 Type 2 diabetes mellitus with hyperosmolarity with coma: Secondary | ICD-10-CM

## 2023-03-11 MED ORDER — LISINOPRIL 20 MG PO TABS
20.0000 mg | ORAL_TABLET | Freq: Every day | ORAL | 0 refills | Status: DC
  Filled 2023-03-11: qty 30, 30d supply, fill #0

## 2023-03-11 NOTE — ED Triage Notes (Signed)
Due to language barrier, an interpreter was present during the history-taking and subsequent discussion (and for part of the physical exam) with this patient. Tara [name]. Number: [id number].  Attempted to call patient from lobby and by phone. Lobby patients and staff said she "stepped out". LM on VM for her to return asap.  Will retry.

## 2023-03-11 NOTE — ED Triage Notes (Signed)
Due to language barrier, an interpreter was present during the history-taking and subsequent discussion (and for part of the physical exam) with this patient. Tara [name]. #[id number]  "I came because I have high blood pressure, I wasn't able to make an appointment or get more medication prescribed for my high blood pressure". No current chest pain/tightness. No ha. No dizziness. No cold or recent illness.

## 2023-03-11 NOTE — ED Triage Notes (Signed)
Attempted to call patient (2nd time) from waiting area, patient is not their. No notification to staff about leaving or waiting in car.  Attempted to call and reach by phone (VM only), No answer.  This is a total of 4 attempts to contact patient.   B. Roten CMA

## 2023-03-11 NOTE — ED Provider Notes (Signed)
[name]    CSN: [id number] Arrival date & time: 03/11/23  1240      History   Chief Complaint Chief Complaint  Patient presents with   Hypertension   Medication Refill    HPI Tara [name] is a 42 y.o. female.   Patient here today for refill of blood pressure medication.  She reports that she has not had any issues with medication she was taking.  She has not had blood pressure medication about a week.  She denies any chest pain, shortness of breath, headaches.  She has not had any dizziness.  She notes she has tried to get in to make appointment with PCP but has had trouble recently and reaching them.  The history is provided by the patient.  Hypertension Pertinent negatives include no chest pain, no abdominal pain, no headaches and no shortness of breath.  Medication Refill   Past Medical History:  Diagnosis Date   Diabetes mellitus (HCC)    type 2   Elevated LFTs    Fatty liver    Hypertension    no meds   Obesity     Patient Active Problem List   Diagnosis Date Noted   Fatty liver 05/16/2019   Encounter for IUD removal 12/08/2016   Contraception management 06/13/2016   Elevated blood pressure reading 06/13/2016   Preeclampsia, third trimester 03/25/2016   GERD (gastroesophageal reflux disease) 03/05/2016   Previous cesarean delivery, antepartum condition or complication 02/04/2016   Encounter for supervision of normal pregnancy 11/27/2015    Past Surgical History:  Procedure Laterality Date   CESAREAN SECTION     THERAPEUTIC ABORTION      OB History     Gravida  4   Para  3   Term  3   Preterm  0   AB  1   Living  3      SAB  0   IAB  1   Ectopic  0   Multiple  0   Live Births  3            Home Medications    Prior to Admission medications   Medication Sig Start Date End Date Taking? Authorizing Provider  albuterol (VENTOLIN HFA) 108 (90 Base) MCG/ACT inhaler Inhale 2 puffs into the lungs  every 6 (six) hours as needed for wheezing or shortness of breath. 02/26/22   Grayce Sessions, NP  atorvastatin (LIPITOR) 40 MG tablet Take 1 tablet (40 mg total) by mouth daily. 01/12/22   Grayce Sessions, NP  Dulaglutide (TRULICITY) 0.75 MG/0.5ML SOPN Inject 0.75 mg into the skin once a week. 01/08/22   Grayce Sessions, NP  fluconazole (DIFLUCAN) 150 MG tablet Take 1 tablet (150 mg total) by mouth daily. Patient not taking: Reported on 01/08/2022 08/05/21   Grayce Sessions, NP  glipiZIDE (GLUCOTROL) 10 MG tablet Take 1 tablet (10 mg total) by mouth 2 (two) times daily before a meal. 01/08/22   Grayce Sessions, NP  lisinopril (ZESTRIL) 20 MG tablet Take 1 tablet (20 mg total) by mouth daily.Need to schedule an office visit. 03/11/23   Tomi Bamberger, PA-C  metFORMIN (GLUCOPHAGE) 1000 MG tablet Take 1 tablet (1,000 mg total) by mouth 2 (two) times daily with a meal. 01/08/22   Grayce Sessions, NP  ondansetron (ZOFRAN-ODT) 4 MG disintegrating tablet Take 1 tablet (4 mg total) by mouth every 8 (eight) hours as needed for nausea or vomiting. 08/06/22   [place],  Acie Fredrickson, FNP  promethazine-dextromethorphan (PROMETHAZINE-DM) 6.25-15 MG/5ML syrup Take 5 mLs by mouth 4 (four) times daily as needed for cough. 02/26/22   Tomi Bamberger, PA-C    Family History Family History  Problem Relation Age of Onset   Diabetes Mother    Hypertension Mother     Social History Social History   Tobacco Use   Smoking status: Never   Smokeless tobacco: Never  Vaping Use   Vaping status: Never Used  Substance Use Topics   Alcohol use: No   Drug use: No     Allergies   Amoxicillin   Review of Systems Review of Systems  Constitutional:  Negative for chills and fever.  Eyes:  Negative for discharge and redness.  Respiratory:  Negative for shortness of breath.   Cardiovascular:  Negative for chest pain and leg swelling.  Gastrointestinal:  Negative for abdominal pain, nausea and  vomiting.  Neurological:  Negative for dizziness and headaches.     Physical Exam Triage Vital Signs ED Triage Vitals  Encounter Vitals Group     BP 03/11/23 1328 (!) 168/112     Systolic BP Percentile --      Diastolic BP Percentile --      Pulse Rate 03/11/23 1328 82     Resp 03/11/23 1328 18     Temp 03/11/23 1328 98.3 F (36.8 C)     Temp Source 03/11/23 1328 Oral     SpO2 03/11/23 1328 99 %     Weight 03/11/23 1320 202 lb 6.1 oz (91.8 kg)     Height 03/11/23 1320 5\' 3"  (1.6 m)     Head Circumference --      Peak Flow --      Pain Score 03/11/23 1317 0     Pain Loc --      Pain Education --      Exclude from Growth Chart --    No data found.  Updated Vital Signs BP (!) 148/93 (BP Location: Left Arm)   Pulse 82   Temp 98.3 F (36.8 C) (Oral)   Resp 18   Ht 5\' 3"  (1.6 m)   Wt 202 lb 6.1 oz (91.8 kg)   LMP 03/08/2023 (Exact Date)   SpO2 99%   BMI 35.85 kg/m   Visual Acuity Right Eye Distance:   Left Eye Distance:   Bilateral Distance:    Right Eye Near:   Left Eye Near:    Bilateral Near:     Physical Exam Vitals and nursing note reviewed.  Constitutional:      General: She is not in acute distress.    Appearance: Normal appearance. She is not ill-appearing.  HENT:     Head: Normocephalic and atraumatic.  Eyes:     Conjunctiva/sclera: Conjunctivae normal.  Cardiovascular:     Rate and Rhythm: Normal rate and regular rhythm.  Pulmonary:     Effort: Pulmonary effort is normal. No respiratory distress.     Breath sounds: Normal breath sounds. No wheezing, rhonchi or rales.  Musculoskeletal:     Right lower leg: No edema.     Left lower leg: No edema.  Neurological:     Mental Status: She is alert.  Psychiatric:        Mood and Affect: Mood normal.        Behavior: Behavior normal.        Thought Content: Thought content normal.      UC Treatments / Results  Labs (all  labs ordered are listed, but only abnormal results are displayed) Labs  Reviewed - No data to display  EKG   Radiology No results found.  Procedures Procedures (including critical care time)  Medications Ordered in UC Medications - No data to display  Initial Impression / Assessment and Plan / UC Course  I have reviewed the triage vital signs and the nursing notes.  Pertinent labs & imaging results that were available during my care of the patient were reviewed by me and considered in my medical decision making (see chart for details).    BP on recheck 148/93.  Lisinopril refilled at previous dosing.  Recommended follow-up with PCP as soon as possible.  Encouraged sooner follow-up with any further concerns.  Final Clinical Impressions(s) / UC Diagnoses   Final diagnoses:  Essential hypertension   Discharge Instructions   None    ED Prescriptions     Medication Sig Dispense Auth. Provider   lisinopril (ZESTRIL) 20 MG tablet Take 1 tablet (20 mg total) by mouth daily.Need to schedule an office visit. 30 tablet Tomi Bamberger, PA-C      PDMP not reviewed this encounter.   Tomi Bamberger, PA-C 03/11/23 1420

## 2023-03-11 NOTE — ED Notes (Signed)
Discussed with patient the need for MyChart (link sent). Staff message sent to PCP/Provider on patients behalf to be contacted asap. She additionally stated the pharmacist/pharmacy person at last p/u "gave me the wrong medication, I took it, it was for Cholesterol and I took it back and they gave me the right medication". I have instructed her to discuss with PCP and Management (with pharmacy).

## 2023-03-25 ENCOUNTER — Ambulatory Visit (INDEPENDENT_AMBULATORY_CARE_PROVIDER_SITE_OTHER): Payer: Self-pay | Admitting: Primary Care

## 2023-04-27 ENCOUNTER — Other Ambulatory Visit: Payer: Self-pay

## 2023-04-28 ENCOUNTER — Telehealth (INDEPENDENT_AMBULATORY_CARE_PROVIDER_SITE_OTHER): Payer: Self-pay | Admitting: Primary Care

## 2023-04-28 ENCOUNTER — Other Ambulatory Visit: Payer: Self-pay

## 2023-04-28 ENCOUNTER — Encounter: Payer: Self-pay | Admitting: Physician Assistant

## 2023-04-28 ENCOUNTER — Ambulatory Visit: Payer: Self-pay | Admitting: Physician Assistant

## 2023-04-28 VITALS — BP 159/98 | HR 94 | Ht 62.99 in | Wt 211.0 lb

## 2023-04-28 DIAGNOSIS — E1165 Type 2 diabetes mellitus with hyperglycemia: Secondary | ICD-10-CM

## 2023-04-28 DIAGNOSIS — I10 Essential (primary) hypertension: Secondary | ICD-10-CM | POA: Insufficient documentation

## 2023-04-28 DIAGNOSIS — Z7984 Long term (current) use of oral hypoglycemic drugs: Secondary | ICD-10-CM

## 2023-04-28 LAB — POCT GLYCOSYLATED HEMOGLOBIN (HGB A1C): HbA1c, POC (controlled diabetic range): 8.7 % — AB (ref 0.0–7.0)

## 2023-04-28 LAB — GLUCOSE, POCT (MANUAL RESULT ENTRY): POC Glucose: 215 mg/dL — AB (ref 70–99)

## 2023-04-28 MED ORDER — LISINOPRIL 20 MG PO TABS: 20.0000 mg | ORAL_TABLET | Freq: Every day | ORAL | 0 refills | Status: DC

## 2023-04-28 NOTE — Telephone Encounter (Signed)
 I called to speak to pt about self pay. I called and explained who I am and my purpose for calling. The pt hung up on me.

## 2023-04-28 NOTE — Progress Notes (Signed)
 Established Patient Office Visit  Subjective   Patient ID: Tara [name], female    DOB: Nov 24, 1980  Age: 43 y.o. MRN: [id number]  Chief Complaint  Patient presents with   Medication Refill    Lisinpril Patient has been without medication x2 days     Blood Sugar Problem   Discussed the use of AI scribe software for clinical note transcription with the patient, who gave verbal consent to proceed.  History of Present Illness      The patient, with a history of hypertension and diabetes, presented for a medication refill. They reported running out of their antihypertensive medication but still had their diabetes medication. They did not monitor their blood pressure or blood glucose levels at home.   Due to language barrier, an interpreter was present during the history-taking and subsequent discussion (and for part of the physical exam) with this patient.  Past Medical History:  Diagnosis Date   Diabetes mellitus (HCC)    type 2   Elevated LFTs    Fatty liver    Hypertension    no meds   Obesity    Social History   Socioeconomic History   Marital status: Single    Spouse name: Not on file   Number of children: Not on file   Years of education: Not on file   Highest education level: Not on file  Occupational History   Not on file  Tobacco Use   Smoking status: Never   Smokeless tobacco: Never  Vaping Use   Vaping status: Never Used  Substance and Sexual Activity   Alcohol use: No   Drug use: No   Sexual activity: Yes    Birth control/protection: None  Other Topics Concern   Not on file  Social History Narrative   Not on file   Social Drivers of Health   Financial Resource Strain: Not on file  Food Insecurity: Not on file  Transportation Needs: Not on file  Physical Activity: Not on file  Stress: Not on file  Social Connections: Not on file  Intimate Partner Violence: Not on file   Family History  Problem Relation Age of Onset   Diabetes  Mother    Hypertension Mother    Allergies  Allergen Reactions   Amoxicillin  Hives    Review of Systems  Constitutional: Negative.   HENT: Negative.    Eyes: Negative.   Respiratory:  Negative for shortness of breath.   Cardiovascular:  Negative for chest pain.  Gastrointestinal: Negative.   Genitourinary: Negative.   Musculoskeletal: Negative.   Skin: Negative.   Neurological: Negative.   Endo/Heme/Allergies: Negative.   Psychiatric/Behavioral: Negative.        Objective:     BP (!) 159/98 (BP Location: Left Arm, Patient Position: Sitting, Cuff Size: Large)   Pulse 94   Ht 5' 2.99 (1.6 m)   Wt 211 lb (95.7 kg)   LMP 04/14/2023   SpO2 95%   BMI 37.39 kg/m  BP Readings from Last 3 Encounters:  04/28/23 (!) 159/98  03/11/23 (!) 148/93  08/06/22 133/86   Wt Readings from Last 3 Encounters:  04/28/23 211 lb (95.7 kg)  03/11/23 202 lb 6.1 oz (91.8 kg)  01/08/22 202 lb 6.4 oz (91.8 kg)    Physical Exam Vitals and nursing note reviewed.  Constitutional:      Appearance: Normal appearance.  HENT:     Head: Normocephalic and atraumatic.     Right Ear: External ear normal.  Left Ear: External ear normal.     Nose: Nose normal.     Mouth/Throat:     Mouth: Mucous membranes are moist.     Pharynx: Oropharynx is clear.  Cardiovascular:     Rate and Rhythm: Normal rate and regular rhythm.     Pulses: Normal pulses.     Heart sounds: Normal heart sounds.  Pulmonary:     Effort: Pulmonary effort is normal.     Breath sounds: Normal breath sounds.  Musculoskeletal:        General: Normal range of motion.     Cervical back: Normal range of motion and neck supple.  Skin:    General: Skin is warm and dry.  Neurological:     General: No focal deficit present.     Mental Status: She is alert and oriented to person, place, and time.  Psychiatric:        Mood and Affect: Mood normal.        Behavior: Behavior normal.        Thought Content: Thought content  normal.        Judgment: Judgment normal.        Assessment & Plan:   Problem List Items Addressed This Visit       Cardiovascular and Mediastinum   Essential hypertension   Relevant Medications   lisinopril  (ZESTRIL ) 20 MG tablet     Endocrine   Type 2 diabetes mellitus with hyperglycemia, without long-term current use of insulin  (HCC) - Primary   Relevant Medications   lisinopril  (ZESTRIL ) 20 MG tablet   Other Relevant Orders   POCT glucose (manual entry) (Completed)   HgB A1c (Completed)   Other Visit Diagnoses       Elevated blood pressure reading in office with diagnosis of hypertension       Relevant Medications   lisinopril  (ZESTRIL ) 20 MG tablet      1. Type 2 diabetes mellitus with hyperglycemia, without long-term current use of insulin  (HCC) (Primary) A1C 8.7 no refills needed today.  Patient encouraged to keep follow-up appointment with primary care provider in 2 weeks.  Patient encouraged to check blood glucose levels at home, keep a written log and have available for all office visits.  Patient declined any type of lab work to be completed today.  - POCT glucose (manual entry)  2. Essential hypertension Continue current regimen.  Patient encouraged to check blood pressure at home, keep a written log and have available for all office visits. - lisinopril  (ZESTRIL ) 20 MG tablet; Take 1 tablet (20 mg total) by mouth daily.  Dispense: 30 tablet; Refill: 0  3. Elevated blood pressure reading in office with diagnosis of hypertension   I have reviewed the patient's medical history (PMH, PSH, Social History, Family History, Medications, and allergies) , and have been updated if relevant. I spent 30 minutes reviewing chart and  face to face time with patient.    Return if symptoms worsen or fail to improve.    Kirk RAMAN Mayers, PA-C

## 2023-04-28 NOTE — Patient Instructions (Signed)
 Your blood pressure is elevated today, I do encourage you to check your blood pressure at home, keep a written log and have available for all office visits.  Make sure to keep your appointment with with your primary care provider, it is important that she be able to follow-up with your health maintenance and necessary lab work.  Kirk CANDIE Sage, PA-C Physician Assistant [facility] [email or web address]   Cmo tomarse la presin arterial How to Take Your Blood Pressure La presin arterial es la medida de la fuerza de la sangre al presionar contra las paredes de las arterias. Las arterias son los vasos sanguneos que transportan la sangre desde el corazn hacia todas las partes del cuerpo. Su mdico toma su presin arterial en cada visita al consultorio. Usted tambin puede tomarse la presin arterial en casa con un tensimetro. Es posible que deba tomarse la presin arterial: Para confirmar un diagnstico de presin arterial elevada (hipertensin). Para vigilar su presin arterial a lo largo del tiempo. Para asegurarse de que el medicamento que toma para la presin arterial est surtiendo engineer, mining. Materiales necesarios: Monitor para medir la presin arterial. Una silla para sentarse. Debe ser ignacia silla en la que pueda sentarse erguido con la espalda apoyada. No se siente en un silln blando o sof. Mesa o escritorio. Cuaderno pequeo y lpiz o bolgrafo. Cmo prepararse Para obtener la lectura ms precisa, evite realizar lo siguiente durante los 30 minutos previos a chief operating officer su presin arterial: Beber cafena. Consumir alcohol. [place]. Fumar. Realizar actividad fsica. Cinco minutos antes de controlar su presin arterial: Vaya al bao y orine para tener la vejiga vaca. Sintese tranquilamente en una silla. No hable. Cmo tomarse la presin arterial Para controlar su presin arterial, siga las instrucciones presentes en el manual que  se incluye con el tensimetro. Si tiene ambulance person, las instrucciones podran ser las siguientes: Sintese derecho en una silla. Coloque los pies en el piso. No cruce los tobillos ni las piernas. Apoye su brazo izquierdo al nivel de su corazn en una mesa o escritorio, o en el brazo de la silla. Arremnguese. Envuelva la parte superior de su brazo izquierdo, 1 pulgada (2.5 cm) sobre su codo, con el brazalete. Es mejor optometrist brazalete alrededor de la piel [place]. Ajuste el brazalete ceidamente, pero no demasiado apretado, alrededor del brazo. Debe poder meter nicamente un dedo entre el brazalete y el brazo. Coloque el cordn de modo que quede apoyado en el pliegue del codo. Presione el botn de encendido. Permanezca sentado tranquilamente mientras el brazalete se infla y se desinfla. Lea la lectura digital que aparece en la pantalla del tensimetro y anote los nmeros (regstrelos) en un cuaderno. Espere de 2 a 3 minutos, y luego repita los pasos desde el paso 1. Qu significa mi lectura de presin arterial? Una lectura de la presin arterial consta de un nmero ms alto sobre un nmero ms bajo. En condiciones ideales, la presin arterial debe estar por debajo de 120/80. El primer nmero ("superior") es la presin sistlica. Es la medida de la presin de las arterias cuando el corazn late. El segundo nmero ("inferior") es la presin diastlica. Es la medida de la presin en las arterias cuando el corazn se relaja. La presin arterial se clasifica en cuatro etapas. Las siguientes son las etapas para adultos que no tienen enfermedad grave de corto plazo o una afeccin crnica. La presin sistlica y la presin diastlica se miden en una unidad llamada mm Hg (milmetros de mercurio).  Normal Presin sistlica: por debajo de 120. Presin diastlica: por debajo de 80. Elevada Presin sistlica: 120-129. Presin diastlica: por debajo de 80. Etapa 1 de hipertensin Presin  sistlica: 130-139. Presin diastlica: 80-89. Etapa 2 de hipertensin Presin sistlica: 140 o ms. Presin diastlica: 90 o ms. Puede tener presin arterial elevada o hipertensin incluso si nicamente el nmero sistlico o el diastlico de su lectura es ms elevado de lo normal. Siga estas indicaciones en su casa: Medicamentos Use los medicamentos de venta libre y los recetados solamente como se lo haya indicado el mdico. Dgale a su mdico si tiene efectos secundarios causados por los medicamentos para la presin arterial. Indicaciones generales Mida su presin arterial con la frecuencia recomendada por su mdico. Contrlese la presin arterial a la misma hora todos los [place]. Lleve el tensimetro a su prxima cita con el mdico para asegurarse de lo siguiente: Que lo usa  correctamente. Que genera lecturas precisas. Asegrese de entender cules son sus nmeros objetivo para la presin arterial. Concurra a todas las visitas de seguimiento. Esto es importante. Consejos generales Su mdico puede sugerirle un tensimetro confiable que cumpla con sus necesidades. Existen varios tipos de tensimetros para cabin crew. Escoja un tensimetro que tenga un brazalete. No escoja un tensimetro que mida su presin arterial en la mueca o el dedo. Elija un brazalete que le envuelva ceidamente, pero no ajuste demasiado ni quede muy flojo, la parte superior del brazo. Debe poder meter nicamente un dedo entre el brazalete y el brazo. Puede comprar un tensimetro en la mayora de las farmacias o en lnea. Dnde obtener ms informacin American Heart Association (Asociacin [place] del [place]): www.heart.org Comunquese con un mdico si: Su presin arterial es sistemticamente alta. Su presin arterial disminuye repentinamente. Solicite ayuda de inmediato si: Su presin arterial sistlica est por encima de 180. Su presin arterial diastlica est por encima de 120. Estos sntomas  pueden customer service manager. Solicite ayuda de inmediato. Llame al 911. No espere a ver si los sntomas desaparecen. No conduzca por sus propios medios officemax incorporated. Resumen La presin arterial es la medida de la fuerza de la sangre al presionar contra las paredes de las arterias. Una lectura de la presin arterial consta de un nmero ms alto sobre un nmero ms bajo. En condiciones ideales, la presin arterial debe estar por debajo de 120/80. Contrlese la presin arterial a la [employer]. Evite la cafena, el alcohol, fumar y hacer actividad fsica durante los 30 minutos anteriores a controlarse la presin arterial. Estos factores pueden afectar la precisin de la lectura de la presin arterial. Esta informacin no tiene como fin reemplazar el consejo del mdico. Asegrese de hacerle al mdico cualquier pregunta que tenga. Document Revised: 01/07/2021 Document Reviewed: 01/07/2021 Elsevier Patient Education  2024 [employer].

## 2023-05-11 ENCOUNTER — Ambulatory Visit (INDEPENDENT_AMBULATORY_CARE_PROVIDER_SITE_OTHER): Payer: Self-pay | Admitting: Primary Care

## 2023-06-20 ENCOUNTER — Ambulatory Visit
Admission: EM | Admit: 2023-06-20 | Discharge: 2023-06-20 | Disposition: A | Discharge status: Home / Self Care | Payer: Self-pay | Attending: Internal Medicine | Admitting: Internal Medicine

## 2023-06-20 DIAGNOSIS — K047 Periapical abscess without sinus: Secondary | ICD-10-CM

## 2023-06-20 DIAGNOSIS — I10 Essential (primary) hypertension: Secondary | ICD-10-CM

## 2023-06-20 MED ORDER — CLINDAMYCIN HCL 150 MG PO CAPS: 450.0000 mg | ORAL_CAPSULE | Freq: Three times a day (TID) | ORAL | 0 refills | Status: AC

## 2023-06-20 MED ORDER — LISINOPRIL 20 MG PO TABS: 20.0000 mg | ORAL_TABLET | Freq: Every day | ORAL | 0 refills | Status: DC

## 2023-06-20 NOTE — ED Provider Notes (Signed)
 [name]    CSN: [id number] Arrival date & time: 06/20/23  0845      History   Chief Complaint Chief Complaint  Patient presents with   Dental Pain    HPI Tara [name] is a 43 y.o. female.   Patient presents with 2 different chief complaints today.  Patient reports left-sided dental pain that started about 3 days ago.  Denies previous history of dental infection or dental issues.  She has not yet contacted a dentist.  Patient has taken over-the-counter Tylenol and diclofenac for pain.  Patient also requesting refill on lisinopril.  Patient reports that she takes lisinopril 20 mg daily and has been taking for multiple years.  States that she has been out of medication for approximately 2 months.  She does not check her blood pressure routinely at home.  She has not yet contacted her PCP for refill.   Dental Pain   Past Medical History:  Diagnosis Date   Diabetes mellitus (HCC)    type 2   Elevated LFTs    Fatty liver    Hypertension    no meds   Obesity     Patient Active Problem List   Diagnosis Date Noted   Type 2 diabetes mellitus with hyperglycemia, without long-term current use of insulin (HCC) 04/28/2023   Essential hypertension 04/28/2023   Fatty liver 05/16/2019   Encounter for IUD removal 12/08/2016   Contraception management 06/13/2016   Elevated blood pressure reading 06/13/2016   Preeclampsia, third trimester 03/25/2016   GERD (gastroesophageal reflux disease) 03/05/2016   Previous cesarean delivery, antepartum condition or complication 02/04/2016   Encounter for supervision of normal pregnancy 11/27/2015    Past Surgical History:  Procedure Laterality Date   CESAREAN SECTION     THERAPEUTIC ABORTION      OB History     Gravida  4   Para  3   Term  3   Preterm  0   AB  1   Living  3      SAB  0   IAB  1   Ectopic  0   Multiple  0   Live Births  3            Home Medications    Prior to  Admission medications   Medication Sig Start Date End Date Taking? Authorizing Provider  clindamycin (CLEOCIN) 150 MG capsule Take 3 capsules (450 mg total) by mouth 3 (three) times daily for 5 days. 06/20/23 06/25/23 Yes Karson Chicas, Acie Fredrickson, FNP  albuterol (VENTOLIN HFA) 108 (90 Base) MCG/ACT inhaler Inhale 2 puffs into the lungs every 6 (six) hours as needed for wheezing or shortness of breath. Patient not taking: Reported on 04/28/2023 02/26/22   Grayce Sessions, NP  atorvastatin (LIPITOR) 40 MG tablet Take 1 tablet (40 mg total) by mouth daily. 01/12/22   Grayce Sessions, NP  Dulaglutide (TRULICITY) 0.75 MG/0.5ML SOPN Inject 0.75 mg into the skin once a week. 01/08/22   Grayce Sessions, NP  fluconazole (DIFLUCAN) 150 MG tablet Take 1 tablet (150 mg total) by mouth daily. Patient not taking: Reported on 04/28/2023 08/05/21   Grayce Sessions, NP  glipiZIDE (GLUCOTROL) 10 MG tablet Take 1 tablet (10 mg total) by mouth 2 (two) times daily before a meal. 01/08/22   Grayce Sessions, NP  lisinopril (ZESTRIL) 20 MG tablet Take 1 tablet (20 mg total) by mouth daily. 06/20/23   Gustavus Bryant, FNP  metFORMIN (GLUCOPHAGE)  1000 MG tablet Take 1 tablet (1,000 mg total) by mouth 2 (two) times daily with a meal. 01/08/22   Grayce Sessions, NP  ondansetron (ZOFRAN-ODT) 4 MG disintegrating tablet Take 1 tablet (4 mg total) by mouth every 8 (eight) hours as needed for nausea or vomiting. Patient not taking: Reported on 04/28/2023 08/06/22   Gustavus Bryant, FNP  promethazine-dextromethorphan (PROMETHAZINE-DM) 6.25-15 MG/5ML syrup Take 5 mLs by mouth 4 (four) times daily as needed for cough. Patient not taking: Reported on 04/28/2023 02/26/22   Tomi Bamberger, PA-C    Family History Family History  Problem Relation Age of Onset   Diabetes Mother    Hypertension Mother     Social History Social History   Tobacco Use   Smoking status: Never   Smokeless tobacco: Never  Vaping Use   Vaping status:  Never Used  Substance Use Topics   Alcohol use: No   Drug use: No     Allergies   Penicillins and Amoxicillin   Review of Systems Review of Systems Per HPI  Physical Exam Triage Vital Signs ED Triage Vitals [06/20/23 0901]  Encounter Vitals Group     BP (!) 153/82     Systolic BP Percentile      Diastolic BP Percentile      Pulse Rate 87     Resp 18     Temp 97.9 F (36.6 C)     Temp Source Oral     SpO2 97 %     Weight      Height      Head Circumference      Peak Flow      Pain Score 7     Pain Loc      Pain Education      Exclude from Growth Chart    No data found.  Updated Vital Signs BP (!) 153/82 (BP Location: Left Arm)   Pulse 87   Temp 97.9 F (36.6 C) (Oral)   Resp 18   LMP 06/05/2023 (Approximate)   SpO2 97%   Visual Acuity Right Eye Distance:   Left Eye Distance:   Bilateral Distance:    Right Eye Near:   Left Eye Near:    Bilateral Near:     Physical Exam Constitutional:      General: She is not in acute distress.    Appearance: Normal appearance. She is not toxic-appearing or diaphoretic.  HENT:     Head: Normocephalic and atraumatic.     Mouth/Throat:      Comments: Patient has mild gingival swelling and erythema present to left upper dentition at the outer lying portion.  No drainage noted. Eyes:     Extraocular Movements: Extraocular movements intact.     Conjunctiva/sclera: Conjunctivae normal.  Pulmonary:     Effort: Pulmonary effort is normal.  Neurological:     General: No focal deficit present.     Mental Status: She is alert and oriented to person, place, and time. Mental status is at baseline.  Psychiatric:        Mood and Affect: Mood normal.        Behavior: Behavior normal.        Thought Content: Thought content normal.        Judgment: Judgment normal.      UC Treatments / Results  Labs (all labs ordered are listed, but only abnormal results are displayed) Labs Reviewed - No data to  display  EKG   Radiology  No results found.  Procedures Procedures (including critical care time)  Medications Ordered in UC Medications - No data to display  Initial Impression / Assessment and Plan / UC Course  I have reviewed the triage vital signs and the nursing notes.  Pertinent labs & imaging results that were available during my care of the patient were reviewed by me and considered in my medical decision making (see chart for details).     1.  Dental infection  Will treat dental infection with clindamycin as patient has penicillin allergy.  Advised to follow-up with dentist for further evaluation and management.  2.  Medication refill  Per PCP note in January 2025, patient is on lisinopril 20 mg daily.  Will refill this medication for 30-day supply and advised patient to follow-up with PCP for further refills.  Kidney function appears normal so refill should be safe.  Patient verbalized understanding and was agreeable with plan.  Interpreter used throughout patient interaction. Final Clinical Impressions(s) / UC Diagnoses   Final diagnoses:  Essential hypertension  Dental infection     Discharge Instructions      Your lisinopril has been refilled.  Follow-up with primary care for further refills.  Clindamycin has been prescribed for dental infection.  Follow-up with dentist.    ED Prescriptions     Medication Sig Dispense Auth. Provider   lisinopril (ZESTRIL) 20 MG tablet Take 1 tablet (20 mg total) by mouth daily. 30 tablet [place], [place], [place]   clindamycin (CLEOCIN) 150 MG capsule Take 3 capsules (450 mg total) by mouth 3 (three) times daily for 5 days. 45 capsule [place], Acie Fredrickson, [place]      PDMP not reviewed this encounter.   Gustavus Bryant, [place] 06/20/23 1114

## 2023-06-20 NOTE — Discharge Instructions (Signed)
 Your lisinopril has been refilled.  Follow-up with primary care for further refills.  Clindamycin has been prescribed for dental infection.  Follow-up with dentist.

## 2023-06-20 NOTE — ED Triage Notes (Signed)
 Patient presents to UC for left sided lower dental pain since 3 days. Unable to get appt with dentist. Treating pain with ASA and OTC pain med. Also wanting a refill on her lisinopril. States unable to afford co-pay with PCP.

## 2023-09-22 ENCOUNTER — Other Ambulatory Visit (INDEPENDENT_AMBULATORY_CARE_PROVIDER_SITE_OTHER): Payer: Self-pay | Admitting: Primary Care

## 2023-09-22 ENCOUNTER — Ambulatory Visit
Admission: EM | Admit: 2023-09-22 | Discharge: 2023-09-22 | Disposition: A | Discharge status: Home / Self Care | Payer: Self-pay | Attending: Physician Assistant | Admitting: Physician Assistant

## 2023-09-22 ENCOUNTER — Encounter: Payer: Self-pay | Admitting: Emergency Medicine

## 2023-09-22 ENCOUNTER — Other Ambulatory Visit: Payer: Self-pay

## 2023-09-22 DIAGNOSIS — E1101 Type 2 diabetes mellitus with hyperosmolarity with coma: Secondary | ICD-10-CM

## 2023-09-22 DIAGNOSIS — R739 Hyperglycemia, unspecified: Secondary | ICD-10-CM

## 2023-09-22 DIAGNOSIS — Z760 Encounter for issue of repeat prescription: Secondary | ICD-10-CM

## 2023-09-22 DIAGNOSIS — I10 Essential (primary) hypertension: Secondary | ICD-10-CM

## 2023-09-22 MED ORDER — LISINOPRIL 20 MG PO TABS
20.0000 mg | ORAL_TABLET | Freq: Every day | ORAL | 0 refills | Status: DC
  Filled 2023-09-22: qty 30, 30d supply, fill #0

## 2023-09-22 MED ORDER — TRULICITY 0.75 MG/0.5ML ~~LOC~~ SOAJ
0.7500 mg | SUBCUTANEOUS | 4 refills | Status: DC
  Filled 2023-09-22: qty 2, 28d supply, fill #0

## 2023-09-22 MED ORDER — METFORMIN HCL 1000 MG PO TABS
1000.0000 mg | ORAL_TABLET | Freq: Two times a day (BID) | ORAL | 1 refills | Status: DC
  Filled 2023-09-22: qty 180, 90d supply, fill #0

## 2023-09-22 NOTE — ED Triage Notes (Signed)
 Pt presents requesting her blood pressure and glucose levels be checked. Pt says she ran out of meds and needs a refill.

## 2023-09-22 NOTE — ED Provider Notes (Signed)
 [name]    CSN: [id number] Arrival date & time: 09/22/23  1156      History   Chief Complaint Chief Complaint  Patient presents with   Hyperglycemia    HPI Tara [name] is a 43 y.o. female.   Patient complains of being out of her blood pressure medicine and her diabetes medicine.  Patient states that she has not had her medicines and several days.  Patient is taking 2 diabetes medicines and a blood pressure medicine.  Patient is followed by [facility].  Patient denies any current complaints she does not check her glucose at home.  Patient denied needs any headaches she denies any chest pain   Hyperglycemia   Past Medical History:  Diagnosis Date   Diabetes mellitus (HCC)    type 2   Elevated LFTs    Fatty liver    Hypertension    no meds   Obesity     Patient Active Problem List   Diagnosis Date Noted   Type 2 diabetes mellitus with hyperglycemia, without long-term current use of insulin (HCC) 04/28/2023   Essential hypertension 04/28/2023   Fatty liver 05/16/2019   Encounter for IUD removal 12/08/2016   Contraception management 06/13/2016   Elevated blood pressure reading 06/13/2016   Preeclampsia, third trimester 03/25/2016   GERD (gastroesophageal reflux disease) 03/05/2016   Previous cesarean delivery, antepartum condition or complication 02/04/2016   Encounter for supervision of normal pregnancy 11/27/2015    Past Surgical History:  Procedure Laterality Date   CESAREAN SECTION     THERAPEUTIC ABORTION      OB History     Gravida  4   Para  3   Term  3   Preterm  0   AB  1   Living  3      SAB  0   IAB  1   Ectopic  0   Multiple  0   Live Births  3            Home Medications    Prior to Admission medications   Medication Sig Start Date End Date Taking? Authorizing Provider  atorvastatin  (LIPITOR) 40 MG tablet Take 1 tablet (40 mg total) by mouth daily. 01/12/22   Marius Siemens, NP  Dulaglutide  (TRULICITY ) 0.75 MG/0.5ML SOAJ Inject 0.75 mg into the skin once a week. 09/22/23   Kileigh Ortmann K, PA-C  glipiZIDE  (GLUCOTROL ) 10 MG tablet Take 1 tablet (10 mg total) by mouth 2 (two) times daily before a meal. 01/08/22   Marius Siemens, NP  lisinopril  (ZESTRIL ) 20 MG tablet Take 1 tablet (20 mg total) by mouth daily. 09/22/23   Ryhanna Dunsmore K, PA-C  metFORMIN  (GLUCOPHAGE ) 1000 MG tablet Take 1 tablet (1,000 mg total) by mouth 2 (two) times daily with a meal. 09/22/23   Sandi Crosby, PA-C    Family History Family History  Problem Relation Age of Onset   Diabetes Mother    Hypertension Mother     Social History Social History   Tobacco Use   Smoking status: Never    Passive exposure: Never   Smokeless tobacco: Never  Vaping Use   Vaping status: Never Used  Substance Use Topics   Alcohol use: No   Drug use: No     Allergies   Penicillins and Amoxicillin    Review of Systems Review of Systems  All other systems reviewed and are negative.    Physical Exam Triage Vital Signs  ED Triage Vitals  Encounter Vitals Group     BP 09/22/23 1239 (!) 148/83     Systolic BP Percentile --      Diastolic BP Percentile --      Pulse Rate 09/22/23 1239 87     Resp 09/22/23 1239 18     Temp 09/22/23 1239 97.7 F (36.5 C)     Temp Source 09/22/23 1239 Oral     SpO2 09/22/23 1239 97 %     Weight 09/22/23 1238 210 lb 15.7 oz (95.7 kg)     Height --      Head Circumference --      Peak Flow --      Pain Score 09/22/23 1237 0     Pain Loc --      Pain Education --      Exclude from Growth Chart --    No data found.  Updated Vital Signs BP (!) 148/83 (BP Location: Left Arm)   Pulse 87   Temp 97.7 F (36.5 C) (Oral)   Resp 18   Wt 95.7 kg   LMP 09/14/2023   SpO2 97%   BMI 37.38 kg/m   Visual Acuity Right Eye Distance:   Left Eye Distance:   Bilateral Distance:    Right Eye Near:   Left Eye Near:    Bilateral Near:     Physical  Exam Vitals and nursing note reviewed.  Constitutional:      Appearance: She is well-developed.  HENT:     Head: Normocephalic.  Cardiovascular:     Rate and Rhythm: Normal rate.  Pulmonary:     Effort: Pulmonary effort is normal.  Abdominal:     General: There is no distension.  Musculoskeletal:        General: Normal range of motion.  Skin:    General: Skin is warm.  Neurological:     General: No focal deficit present.     Mental Status: She is alert and oriented to person, place, and time.      UC Treatments / Results  Labs (all labs ordered are listed, but only abnormal results are displayed) Labs Reviewed - No data to display  EKG   Radiology No results found.  Procedures Procedures (including critical care time)  Medications Ordered in UC Medications - No data to display  Initial Impression / Assessment and Plan / UC Course  I have reviewed the triage vital signs and the nursing notes.  Pertinent labs & imaging results that were available during my care of the patient were reviewed by me and considered in my medical decision making (see chart for details).      Final Clinical Impressions(s) / UC Diagnoses   Final diagnoses:  Hyperglycemia  Essential hypertension   Discharge Instructions   None    ED Prescriptions     Medication Sig Dispense Auth. Provider   Dulaglutide  (TRULICITY ) 0.75 MG/0.5ML SOAJ Inject 0.75 mg into the skin once a week. 2 mL Smaran Gaus K, PA-C   lisinopril  (ZESTRIL ) 20 MG tablet Take 1 tablet (20 mg total) by mouth daily. 30 tablet Doraine Schexnider K, PA-C   metFORMIN  (GLUCOPHAGE ) 1000 MG tablet Take 1 tablet (1,000 mg total) by mouth 2 (two) times daily with a meal. 180 tablet Sandi Crosby, PA-C      PDMP not reviewed this encounter. An After Visit Summary was printed and given to the patient.       Sandi Crosby, PA-C 09/22/23 1413

## 2023-09-23 NOTE — Telephone Encounter (Addendum)
 Using [facility] [id number]. Patient called and advised she's due for an appointment so that she can receive her medication refills, she agrees, scheduled July 2 for physical. Advised a refill will be sent in for glipizide  to the pharmacy.

## 2023-09-23 NOTE — Telephone Encounter (Signed)
 Requested medication (s) are due for refill today: Yes  Requested medication (s) are on the active medication list: Yes  Last refill:  01/08/22  Future visit scheduled: Yes  Notes to clinic:  Unable to refill per protocol due to failed labs, no updated results.      Requested Prescriptions  Pending Prescriptions Disp Refills   glipiZIDE  (GLUCOTROL ) 10 MG tablet 180 tablet 1    Sig: Take 1 tablet (10 mg total) by mouth 2 (two) times daily before a meal.     Endocrinology:  Diabetes - Sulfonylureas Failed - 09/23/2023  4:05 PM      Failed - HBA1C is between 0 and 7.9 and within 180 days    HbA1c, POC (controlled diabetic range)  Date Value Ref Range Status  04/28/2023 8.7 (A) 0.0 - 7.0 % Final         Failed - Cr in normal range and within 360 days    Creat  Date Value Ref Range Status  03/12/2016 0.51 0.50 - 1.10 mg/dL Final   Creatinine, Ser  Date Value Ref Range Status  08/06/2022 0.71 0.57 - 1.00 mg/dL Final   Creatinine, Urine  Date Value Ref Range Status  03/25/2016 51.00 mg/dL Final         Failed - Valid encounter within last 6 months    Recent Outpatient Visits           1 year ago Type 2 diabetes mellitus with hyperosmolar coma, unspecified whether long term insulin use (HCC)   [place] [facility] Marius Siemens, NP   2 years ago Screening for diabetes mellitus   [place] [facility] Marius Siemens, NP   2 years ago Vaginal discharge   [place] [facility] Marius Siemens, NP   3 years ago Essential hypertension   [place] [facility] Marius Siemens, NP   4 years ago Vaginal discharge    [facility] Marius Siemens, NP

## 2023-09-28 ENCOUNTER — Other Ambulatory Visit: Payer: Self-pay

## 2023-10-14 ENCOUNTER — Encounter (INDEPENDENT_AMBULATORY_CARE_PROVIDER_SITE_OTHER): Payer: Self-pay | Admitting: Primary Care

## 2023-10-21 ENCOUNTER — Encounter (INDEPENDENT_AMBULATORY_CARE_PROVIDER_SITE_OTHER): Payer: Self-pay | Admitting: Primary Care

## 2023-10-21 ENCOUNTER — Other Ambulatory Visit: Payer: Self-pay

## 2023-11-11 ENCOUNTER — Telehealth (INDEPENDENT_AMBULATORY_CARE_PROVIDER_SITE_OTHER): Payer: Self-pay | Admitting: Primary Care

## 2023-11-11 NOTE — Telephone Encounter (Signed)
 Called pt to confirm appt. Pt did not answer and LVM

## 2023-11-12 ENCOUNTER — Encounter (INDEPENDENT_AMBULATORY_CARE_PROVIDER_SITE_OTHER): Payer: Self-pay | Admitting: Primary Care

## 2023-11-30 ENCOUNTER — Ambulatory Visit
Admission: EM | Admit: 2023-11-30 | Discharge: 2023-11-30 | Disposition: A | Discharge status: Home / Self Care | Payer: Self-pay | Attending: Nurse Practitioner | Admitting: Nurse Practitioner

## 2023-11-30 ENCOUNTER — Encounter: Payer: Self-pay | Admitting: Emergency Medicine

## 2023-11-30 DIAGNOSIS — I10 Essential (primary) hypertension: Secondary | ICD-10-CM

## 2023-11-30 DIAGNOSIS — E1165 Type 2 diabetes mellitus with hyperglycemia: Secondary | ICD-10-CM

## 2023-11-30 DIAGNOSIS — Z760 Encounter for issue of repeat prescription: Secondary | ICD-10-CM

## 2023-11-30 DIAGNOSIS — Z91199 Patient's noncompliance with other medical treatment and regimen due to unspecified reason: Secondary | ICD-10-CM

## 2023-11-30 DIAGNOSIS — E1101 Type 2 diabetes mellitus with hyperosmolarity with coma: Secondary | ICD-10-CM

## 2023-11-30 MED ORDER — METFORMIN HCL 1000 MG PO TABS: 1000.0000 mg | ORAL_TABLET | Freq: Two times a day (BID) | ORAL | 0 refills | Status: DC

## 2023-11-30 MED ORDER — LISINOPRIL 20 MG PO TABS: 20.0000 mg | ORAL_TABLET | Freq: Every day | ORAL | 0 refills | Status: DC

## 2023-11-30 MED ORDER — GLIPIZIDE 10 MG PO TABS: 10.0000 mg | ORAL_TABLET | Freq: Two times a day (BID) | ORAL | 0 refills | Status: DC

## 2023-11-30 NOTE — Discharge Instructions (Addendum)
 Usted fue visto hoy para el manejo de su presin arterial alta e hipertensin y su diabetes tipo 2. Su medicamento para la presin, lisinopril , ha sido renovado por [address] Tambin se le dieron recetas por 30 das de glipizida y metformina para su diabetes. Estas renovaciones son temporales y es muy importante que programe una cita con su proveedor de atencin primaria lo antes posible para continuar con su cuidado, obtener ms renovaciones de sus medicamentos y Heritage manager de laboratorio actualizados. Las renovaciones futuras requerirn seguimiento con su proveedor de Marine scientist.  En casa, debe revisar su presin arterial regularmente y anotar sus lecturas. Informe a su mdico si su presin est constantemente alta o si nota sntomas como dolor en el pecho, dificultad para respirar, dolor de cabeza fuerte, mareo o cambios en la visin. Para la diabetes, contine revisando su azcar en la sangre y registrando sus resultados. Est atento a signos de azcar alta, como mucha sed, orinar con frecuencia o visin borrosa, y signos de azcar baja, como temblores, sudoracin o confusin. Si presenta un episodio de azcar baja, trtelo de inmediato con una fuente rpida de azcar como jugo o tabletas de glucosa.  Por favor, haga una cita con su proveedor de atencin primaria lo antes posible para revisar sus medicamentos y International aid/development worker anlisis de laboratorio actualizados, incluyendo funcin renal, colesterol y niveles de glucosa en la [place]. Vaya a la sala de emergencias de inmediato si presenta dolor en el pecho, dificultad para respirar, debilidad repentina, confusin, dolor de cabeza severo, azcar muy alta o muy baja que no mejora, o cualquier otro sntoma preocupante.  You were seen today for management of your high blood pressure and type 2 diabetes. Your blood pressure medication, lisinopril , has been refilled for 30 days. You were also given 30-day prescriptions for glipizide  and metformin  for  your diabetes. These refills are temporary, and it is very important that you schedule an appointment with your primary care provider as soon as possible for continued care, medication refills, and updated lab work. Future refills will require follow-up with your primary care provider.  At home, you should check your blood pressure regularly and write down your readings. Let your doctor know if your blood pressure is consistently high or if you notice symptoms such as chest pain, shortness of breath, severe headache, dizziness, or vision changes. For diabetes, continue to monitor your blood sugar and record your readings. Watch for signs of high blood sugar, such as increased thirst, frequent urination, or blurred vision, and signs of low blood sugar, such as shakiness, sweating, or confusion. If low blood sugar occurs, treat right away with a quick source of sugar like juice or glucose tablets.  Please follow up with your primary care provider as soon as possible to review your medications and get updated labs, including kidney function, cholesterol, and blood glucose levels. Go to the emergency department immediately if you experience chest pain, difficulty breathing, sudden weakness, confusion, severe headache, very high or very low blood sugar that does not improve, or any other concerning symptoms.

## 2023-11-30 NOTE — ED Triage Notes (Signed)
 Pt st's she wants to get refills on her diabetic medicine and her blood pressure meds.  St's she has not been able to see her PCP

## 2023-11-30 NOTE — ED Provider Notes (Signed)
 [name]    CSN: [id number] Arrival date & time: 11/30/23  1230      History   Chief Complaint Chief Complaint  Patient presents with   Needs Meds Refilled    HPI Tara [name] is a 43 y.o. female.   Spanish interpreter used  Alfonso [phone]  Discussed the use of AI scribe software for clinical note transcription with the patient, who gave verbal consent to proceed.   Patient presents for medication refills for her blood sugar and blood pressure. She reports her medications have run out and she has been unable to schedule an appointment with her primary care provider. Patient states she experiences dizziness and sleepiness at times when her blood pressure is high; although she does not check her blood pressure or blood sugar on a regular basis.  She denies any chest pain, shortness of breath, nausea, or vomiting.  No dizziness currently.  She mentions she was previously on Trulicity , glipizide , metformin , and lisinopril  for diabetes and hypertension management. The patient has not seen her primary care provider since 2023 and has not had any blood work done since January of this year. In January, she received a 30-day supply of her blood pressure medication from a physician assistant with  mobile medicine. She was unaware of a scheduled appointment on July 2nd and missed it. The patient also did not respond to a voicemail left on July 30th to set up another appointment.  The following portions of the patient's history were reviewed and updated as appropriate: allergies, current medications, past family history, past medical history, past social history, past surgical history, and problem list.    Past Medical History:  Diagnosis Date   Diabetes mellitus (HCC)    type 2   Elevated LFTs    Fatty liver    Hypertension    no meds   Obesity     Patient Active Problem List   Diagnosis Date Noted   Type 2 diabetes mellitus with hyperglycemia,  without long-term current use of insulin (HCC) 04/28/2023   Essential hypertension 04/28/2023   Fatty liver 05/16/2019   Encounter for IUD removal 12/08/2016   Contraception management 06/13/2016   Elevated blood pressure reading 06/13/2016   Preeclampsia, third trimester 03/25/2016   GERD (gastroesophageal reflux disease) 03/05/2016   Previous cesarean delivery, antepartum condition or complication 02/04/2016   Encounter for supervision of normal pregnancy 11/27/2015    Past Surgical History:  Procedure Laterality Date   CESAREAN SECTION     THERAPEUTIC ABORTION      OB History     Gravida  4   Para  3   Term  3   Preterm  0   AB  1   Living  3      SAB  0   IAB  1   Ectopic  0   Multiple  0   Live Births  3            Home Medications    Prior to Admission medications   Medication Sig Start Date End Date Taking? Authorizing Provider  glipiZIDE  (GLUCOTROL ) 10 MG tablet Take 1 tablet (10 mg total) by mouth 2 (two) times daily before a meal. 11/30/23   Iola Lukes, FNP  lisinopril  (ZESTRIL ) 20 MG tablet Take 1 tablet (20 mg total) by mouth daily. 11/30/23   Iola Lukes, FNP  metFORMIN  (GLUCOPHAGE ) 1000 MG tablet Take 1 tablet (1,000 mg total) by mouth 2 (two) times daily with a  meal. 11/30/23   Iola Lukes, FNP    Family History Family History  Problem Relation Age of Onset   Diabetes Mother    Hypertension Mother     Social History Social History   Tobacco Use   Smoking status: Never    Passive exposure: Never   Smokeless tobacco: Never  Vaping Use   Vaping status: Never Used  Substance Use Topics   Alcohol use: No   Drug use: No     Allergies   Penicillins and Amoxicillin    Review of Systems Review of Systems  Eyes:  Negative for visual disturbance.  Respiratory:  Negative for shortness of breath.   Cardiovascular:  Negative for chest pain, palpitations and leg swelling.  Gastrointestinal:  Negative for nausea  and vomiting.  Neurological:  Positive for dizziness. Negative for headaches.  All other systems reviewed and are negative.    Physical Exam Triage Vital Signs ED Triage Vitals  Encounter Vitals Group     BP 11/30/23 1315 (!) 153/117     Girls Systolic BP Percentile --      Girls Diastolic BP Percentile --      Boys Systolic BP Percentile --      Boys Diastolic BP Percentile --      Pulse Rate 11/30/23 1315 85     Resp 11/30/23 1315 18     Temp 11/30/23 1315 98.1 F (36.7 C)     Temp Source 11/30/23 1315 Oral     SpO2 11/30/23 1315 98 %     Weight --      Height --      Head Circumference --      Peak Flow --      Pain Score 11/30/23 1320 0     Pain Loc --      Pain Education --      Exclude from Growth Chart --    No data found.  Updated Vital Signs BP (!) 153/117 (BP Location: Left Arm)   Pulse 85   Temp 98.1 F (36.7 C) (Oral)   Resp 18   SpO2 98%   Visual Acuity Right Eye Distance:   Left Eye Distance:   Bilateral Distance:    Right Eye Near:   Left Eye Near:    Bilateral Near:     Physical Exam Vitals reviewed.  Constitutional:      General: She is awake. She is not in acute distress.    Appearance: Normal appearance. She is well-developed. She is not ill-appearing, toxic-appearing or diaphoretic.  HENT:     Head: Normocephalic.     Right Ear: Hearing normal.     Left Ear: Hearing normal.     Nose: Nose normal.     Mouth/Throat:     Mouth: Mucous membranes are moist.  Eyes:     General: Vision grossly intact.     Conjunctiva/sclera: Conjunctivae normal.  Cardiovascular:     Rate and Rhythm: Normal rate and regular rhythm.     Heart sounds: Normal heart sounds.  Pulmonary:     Effort: Pulmonary effort is normal.     Breath sounds: Normal breath sounds and air entry.  Musculoskeletal:        General: Normal range of motion.     Cervical back: Full passive range of motion without pain, normal range of motion and neck supple.  Skin:     General: Skin is warm and dry.  Neurological:     General: No focal deficit present.  Mental Status: She is alert and oriented to person, place, and time.  Psychiatric:        Speech: Speech normal.        Behavior: Behavior is cooperative.      UC Treatments / Results  Labs (all labs ordered are listed, but only abnormal results are displayed) Labs Reviewed - No data to display  EKG   Radiology No results found.  Procedures Procedures (including critical care time)  Medications Ordered in UC Medications - No data to display  Initial Impression / Assessment and Plan / UC Course  I have reviewed the triage vital signs and the nursing notes.  Pertinent labs & imaging results that were available during my care of the patient were reviewed by me and considered in my medical decision making (see chart for details).    The patient presents with a history of hypertension and type 2 diabetes mellitus with reports of dizziness and sleepiness when blood pressure is elevated. She has been taking lisinopril , which helps manage her symptoms, but has not had follow-up or bloodwork since 2023, raising concerns for uncontrolled disease and potential complications. A 30-day supply of lisinopril  was prescribed with instructions to monitor blood pressure regularly and to report worsening symptoms such as chest pain, shortness of breath, severe headache, or vision changes.  She also reports use of multiple diabetes medications, including oral agents and a prior injectable, likely Trulicity . She has not had recent labs or follow-up, creating concern for glycemic control. A 30-day supply of glipizide  and metformin  was prescribed, and she was advised to monitor blood glucose closely and report symptoms such as polyuria, polydipsia, or signs of hypoglycemia.  The patient has demonstrated medication non-adherence and lack of follow-up, missing her most recent appointment on July 2nd and not  responding to rescheduling attempts. She was counseled on the importance of regular follow-up, adherence to prescribed regimens, and timely lab monitoring. She was strongly advised to contact her primary care provider immediately to schedule an appointment for continued management, medication refills, and a full panel of bloodwork including kidney function, cholesterol, and glucose testing. She was informed that future medication refills will require follow-up with her primary care provider. Emergency evaluation is warranted for severe or worsening hypertension, chest pain, neurological changes, or signs of hyper- or hypoglycemia.  Today's evaluation has revealed no signs of a dangerous process. Discussed diagnosis with patient and/or guardian. Patient and/or guardian aware of their diagnosis, possible red flag symptoms to watch out for and need for close follow up. Patient and/or guardian understands verbal and written discharge instructions. Patient and/or guardian comfortable with plan and disposition.  Patient and/or guardian has a clear mental status at this time, good insight into illness (after discussion and teaching) and has clear judgment to make decisions regarding their care  Documentation was completed with the aid of voice recognition software. Transcription may contain typographical errors. Final Clinical Impressions(s) / UC Diagnoses   Final diagnoses:  Essential hypertension  Type 2 diabetes mellitus with hyperglycemia, without long-term current use of insulin (HCC)  Elevated blood pressure reading in office with diagnosis of hypertension  Nonadherence to medical treatment  Encounter for medication refill     Discharge Instructions      Usted fue visto hoy para el manejo de su presin arterial alta e hipertensin y su diabetes tipo 2. Su medicamento para la presin, lisinopril , ha sido renovado por [address] Tambin se le dieron recetas por [address] de glipizida y metformina  para su  diabetes. Estas renovaciones son temporales y es muy importante que programe una cita con su proveedor de atencin primaria lo antes posible para continuar con su cuidado, obtener ms renovaciones de sus medicamentos y Heritage manager de laboratorio actualizados. Las renovaciones futuras requerirn seguimiento con su proveedor de Marine scientist.  En casa, debe revisar su presin arterial regularmente y anotar sus lecturas. Informe a su mdico si su presin est constantemente alta o si nota sntomas como dolor en el pecho, dificultad para respirar, dolor de cabeza fuerte, mareo o cambios en la visin. Para la diabetes, contine revisando su azcar en la sangre y registrando sus resultados. Est atento a signos de azcar alta, como mucha sed, orinar con frecuencia o visin borrosa, y signos de azcar baja, como temblores, sudoracin o confusin. Si presenta un episodio de azcar baja, trtelo de inmediato con una fuente rpida de azcar como jugo o tabletas de glucosa.  Por favor, haga una cita con su proveedor de atencin primaria lo antes posible para revisar sus medicamentos y International aid/development worker anlisis de laboratorio actualizados, incluyendo funcin renal, colesterol y niveles de glucosa en la [place]. Vaya a la sala de emergencias de inmediato si presenta dolor en el pecho, dificultad para respirar, debilidad repentina, confusin, dolor de cabeza severo, azcar muy alta o muy baja que no mejora, o cualquier otro sntoma preocupante.  You were seen today for management of your high blood pressure and type 2 diabetes. Your blood pressure medication, lisinopril , has been refilled for 30 days. You were also given 30-day prescriptions for glipizide  and metformin  for your diabetes. These refills are temporary, and it is very important that you schedule an appointment with your primary care provider as soon as possible for continued care, medication refills, and updated lab work. Future refills will require  follow-up with your primary care provider.  At home, you should check your blood pressure regularly and write down your readings. Let your doctor know if your blood pressure is consistently high or if you notice symptoms such as chest pain, shortness of breath, severe headache, dizziness, or vision changes. For diabetes, continue to monitor your blood sugar and record your readings. Watch for signs of high blood sugar, such as increased thirst, frequent urination, or blurred vision, and signs of low blood sugar, such as shakiness, sweating, or confusion. If low blood sugar occurs, treat right away with a quick source of sugar like juice or glucose tablets.  Please follow up with your primary care provider as soon as possible to review your medications and get updated labs, including kidney function, cholesterol, and blood glucose levels. Go to the emergency department immediately if you experience chest pain, difficulty breathing, sudden weakness, confusion, severe headache, very high or very low blood sugar that does not improve, or any other concerning symptoms.      ED Prescriptions     Medication Sig Dispense Auth. Provider   glipiZIDE  (GLUCOTROL ) 10 MG tablet Take 1 tablet (10 mg total) by mouth 2 (two) times daily before a meal. 60 tablet Iola Lukes, FNP   lisinopril  (ZESTRIL ) 20 MG tablet Take 1 tablet (20 mg total) by mouth daily. 30 tablet Mariabella Nilsen, [place], FNP   metFORMIN  (GLUCOPHAGE ) 1000 MG tablet Take 1 tablet (1,000 mg total) by mouth 2 (two) times daily with a meal. 60 tablet Iola Lukes, FNP      PDMP not reviewed this encounter.   Iola Lukes, [place] 11/30/23 2129

## 2024-01-28 ENCOUNTER — Other Ambulatory Visit: Payer: Self-pay

## 2024-01-28 ENCOUNTER — Other Ambulatory Visit (INDEPENDENT_AMBULATORY_CARE_PROVIDER_SITE_OTHER): Payer: Self-pay | Admitting: Primary Care

## 2024-01-28 ENCOUNTER — Ambulatory Visit (HOSPITAL_COMMUNITY)
Admission: EM | Admit: 2024-01-28 | Discharge: 2024-01-28 | Disposition: A | Discharge status: Home / Self Care | Payer: Self-pay

## 2024-01-28 DIAGNOSIS — I10 Essential (primary) hypertension: Secondary | ICD-10-CM

## 2024-01-28 LAB — GLUCOSE, POCT (MANUAL RESULT ENTRY): POC Glucose: 437 mg/dL — AB (ref 70–99)

## 2024-01-28 NOTE — ED Notes (Signed)
 Call patient from the lobby no answer x 2.

## 2024-01-28 NOTE — Congregational Nurse Program (Signed)
  Dept: [phone]   Congregational Nurse Program Note  Date of Encounter: 01/28/2024  [facility] to [facility], requested blood pressure and glucose checks.  BP 138/90, repeat 147/94, blood glucose 437.  Given information by interpreter with written address to go to urgent care for elevated BP and blood glucose.   Through assistance with interpreter found she has been out of medicine for at least a week.  Called [facility] to request refill of meds. Past Medical History: Past Medical History:  Diagnosis Date   Diabetes mellitus (HCC)    type 2   Elevated LFTs    Fatty liver    Hypertension    no meds   Obesity     Encounter Details:

## 2024-01-29 ENCOUNTER — Ambulatory Visit
Admission: EM | Admit: 2024-01-29 | Discharge: 2024-01-29 | Disposition: A | Discharge status: Home / Self Care | Payer: Self-pay | Attending: Nurse Practitioner | Admitting: Nurse Practitioner

## 2024-01-29 ENCOUNTER — Encounter: Payer: Self-pay | Admitting: Emergency Medicine

## 2024-01-29 DIAGNOSIS — Z91199 Patient's noncompliance with other medical treatment and regimen due to unspecified reason: Secondary | ICD-10-CM

## 2024-01-29 DIAGNOSIS — R81 Glycosuria: Secondary | ICD-10-CM

## 2024-01-29 DIAGNOSIS — E1165 Type 2 diabetes mellitus with hyperglycemia: Secondary | ICD-10-CM

## 2024-01-29 DIAGNOSIS — I10 Essential (primary) hypertension: Secondary | ICD-10-CM

## 2024-01-29 DIAGNOSIS — E1101 Type 2 diabetes mellitus with hyperosmolarity with coma: Secondary | ICD-10-CM

## 2024-01-29 DIAGNOSIS — Z760 Encounter for issue of repeat prescription: Secondary | ICD-10-CM

## 2024-01-29 LAB — POCT URINE DIPSTICK
Bilirubin, UA: NEGATIVE
Glucose, UA: 500 mg/dL — AB
Leukocytes, UA: NEGATIVE
Nitrite, UA: NEGATIVE
POC PROTEIN,UA: 100 — AB
Spec Grav, UA: 1.02 (ref 1.010–1.025)
Urobilinogen, UA: 0.2 U/dL
pH, UA: 6 (ref 5.0–8.0)

## 2024-01-29 LAB — GLUCOSE, POCT (MANUAL RESULT ENTRY): POC Glucose: 280 mg/dL — AB (ref 70–99)

## 2024-01-29 MED ORDER — METFORMIN HCL 1000 MG PO TABS: 1000.0000 mg | ORAL_TABLET | Freq: Two times a day (BID) | ORAL | 0 refills | Status: DC

## 2024-01-29 MED ORDER — GLIPIZIDE 10 MG PO TABS: 10.0000 mg | ORAL_TABLET | Freq: Two times a day (BID) | ORAL | 0 refills | Status: DC

## 2024-01-29 MED ORDER — LISINOPRIL 20 MG PO TABS: 20.0000 mg | ORAL_TABLET | Freq: Every day | ORAL | 0 refills | Status: DC

## 2024-01-29 NOTE — ED Triage Notes (Signed)
 Spanish Interpretor [name] - Interpretor ID [phone]   Pt presents c/o  hypertension x 1 day. Pt states,  I checked my blood pressure and glucose in mobile unit and they told me to come here to get medication until I see my primary dr.  Glucose was 240 BP was 147/94

## 2024-01-29 NOTE — Discharge Instructions (Addendum)
 Usted fue atendida hoy porque se detect que su presin arterial y su nivel de azcar en la sangre estaban elevados durante una revisin de salud comunitaria. Su presin arterial hoy fue de 147/104 y su nivel de azcar fue de 280. El anlisis de comoros mostr azcar y una pequea cantidad de cetonas, pero sin seales de infeccin. Estos resultados son consistentes con su historial de diabetes e hipertensin que actualmente no estn bien controladas.  Se le dio una receta por 30 das de sus medicamentos: lisinopril , glipizida y metformina, para reiniciar su tratamiento y ayudar a Tree surgeon su presin arterial y su nivel de azcar. Es muy importante que tome estos medicamentos todos los [address]  [address] segn las indicaciones, incluso si se siente bien. Saltarse dosis o tomarlos solo cuando se siente mal puede causar que sus niveles de International aid/development worker y presin arterial suban peligrosamente, lo que puede provocar complicaciones graves como dao a los riones, ataque cardaco o derrame cerebral.  Por favor, haga una cita con un proveedor de atencin primaria lo antes posible para el manejo continuo de su diabetes y presin arterial. Se le dio informacin de [facility], que ofrece atencin mdica asequible y se Advertising copywriter a la comunidad Geologist, engineering. Llame a su oficina el lunes por la maana para programar una visita. En adelante, las recetas de sus medicamentos debern provenir de su proveedor de atencin primaria, no del centro de atencin urgente.  Beba suficiente agua, coma comidas balanceadas y evite saltarse comidas o consumir alimentos con alto contenido de azcar o sal. Trate de revisar su presin arterial y su nivel de azcar todos los [address]  [address] si es posible, y [employer] para llevarlos a su prxima cita.  Vaya al departamento de emergencias de inmediato si presenta dolor en el pecho, dificultad para respirar, mareos, confusin, vmitos, dolor de cabeza intenso, cambios en la visin o si su  nivel de azcar se mantiene constantemente por encima de 400 a pesar de tomar sus medicamentos. Estos pueden ser signos de niveles peligrosamente altos de azcar o presin arterial que requieren tratamiento urgente.  You were seen today because your blood pressure and blood sugar were found to be elevated during a community health screening. Your blood pressure today was 147/104, and your blood sugar was 280. Your urine test showed sugar and a small amount of ketones but no signs of infection. These findings are consistent with your history of diabetes and high blood pressure that are not well controlled right now. You were given a 30-day refill of your lisinopril , glipizide , and metformin  to restart your medications and help stabilize your blood pressure and blood sugar. It is very important that you take these medicines every day as prescribed, even if you feel fine. Skipping doses or taking them only when you don't feel well can cause your blood sugar and blood pressure to become dangerously high, which can lead to serious complications like kidney damage, heart attack, or stroke. Please make an appointment with a primary care provider as soon as possible for ongoing management of your diabetes and blood pressure. You were given information for [facility], which offers affordable primary care and specializes in serving the Latino community. Call their office on Monday morning to schedule a visit. Going forward, medication refills will need to come from your primary care provider, not urgent care. Drink plenty of water, eat balanced meals, and avoid skipping meals or eating foods high in sugar or salt. Try to check your blood pressure and  blood sugar daily if possible and write down your readings to bring to your next appointment. Go to the emergency department immediately if you experience chest pain, shortness of breath, dizziness, confusion, vomiting, severe headache, vision changes, or if  your blood sugar remains consistently above 400 despite taking your medication. These could be signs of dangerously high blood sugar or blood pressure that require emergency treatment.

## 2024-01-29 NOTE — ED Provider Notes (Signed)
 [name]    CSN: [id number] Arrival date & time: 01/29/24  1735      History   Chief Complaint Chief Complaint  Patient presents with   Hypertension    HPI Tara [name] is a 43 y.o. female.   Spanish Interpreter  Tara [name] [phone]  Discussed the use of AI scribe software for clinical note transcription with the patient, who gave verbal consent to proceed.   The patient presents for evaluation of elevated blood pressure and blood glucose readings identified during a mobile health unit screening yesterday. She has a known history of hypertension and diabetes mellitus but admits to noncompliance with both medications and follow-up appointments. She was last seen in this clinic on 11/30/2023, at which time she requested refills for her blood pressure and diabetes medications. A 30-day supply of lisinopril , glipizide , and metformin  was prescribed, and extensive education was provided regarding the importance of regular follow-up with her primary care provider.  Since that visit, the patient reports difficulty obtaining refills from her previous providers due to not being seen in their office for an extended period. Yesterday, she attended the [employer] for a flu clinic and requested a blood pressure and glucose check. Her blood pressure readings were 138/90 mmHg and 147/94 mmHg, and her blood glucose was 437 mg/dL. She was advised to seek urgent evaluation and reportedly presented to [facility] on [facility] but left prior to being seen.  Today, she reports feeling tired and thirsty but denies headache, dizziness, blurred vision, chest pain, shortness of breath, nausea, vomiting, or abdominal pain. She admits she has not been taking her medications consistently and only takes them when she does not feel well rather than daily as prescribed. Her most recent hemoglobin A1c was 8.7%, measured in January.  The  following sections of the patient's history were reviewed and updated as appropriate: allergies, current medications, past family history, past medical history, past social history, past surgical history, and problem list.     Past Medical History:  Diagnosis Date   Diabetes mellitus (HCC)    type 2   Elevated LFTs    Fatty liver    Hypertension    no meds   Obesity     Patient Active Problem List   Diagnosis Date Noted   Type 2 diabetes mellitus with hyperglycemia, without long-term current use of insulin (HCC) 04/28/2023   Essential hypertension 04/28/2023   Fatty liver 05/16/2019   Encounter for IUD removal 12/08/2016   Contraception management 06/13/2016   Elevated blood pressure reading 06/13/2016   Preeclampsia, third trimester 03/25/2016   GERD (gastroesophageal reflux disease) 03/05/2016   Previous cesarean delivery, antepartum condition or complication 02/04/2016   Encounter for supervision of normal pregnancy 11/27/2015    Past Surgical History:  Procedure Laterality Date   CESAREAN SECTION     THERAPEUTIC ABORTION      OB History     Gravida  4   Para  3   Term  3   Preterm  0   AB  1   Living  3      SAB  0   IAB  1   Ectopic  0   Multiple  0   Live Births  3            Home Medications    Prior to Admission medications   Medication Sig Start Date End Date Taking? Authorizing Provider  glipiZIDE  (GLUCOTROL ) 10 MG tablet  Take 1 tablet (10 mg total) by mouth 2 (two) times daily before a meal. 01/29/24   Iola Lukes, FNP  lisinopril  (ZESTRIL ) 20 MG tablet Take 1 tablet (20 mg total) by mouth daily. 01/29/24   Dynisha Due, FNP  metFORMIN  (GLUCOPHAGE ) 1000 MG tablet Take 1 tablet (1,000 mg total) by mouth 2 (two) times daily with a meal. 01/29/24   Iola Lukes, FNP    Family History Family History  Problem Relation Age of Onset   Diabetes Mother    Hypertension Mother     Social History Social History    Tobacco Use   Smoking status: Never    Passive exposure: Never   Smokeless tobacco: Never  Vaping Use   Vaping status: Never Used  Substance Use Topics   Alcohol use: No   Drug use: No     Allergies   Penicillins and Amoxicillin    Review of Systems Review of Systems  Constitutional:  Positive for fatigue.  Eyes:  Negative for visual disturbance.  Respiratory:  Negative for shortness of breath.   Cardiovascular:  Negative for chest pain, palpitations and leg swelling.  Gastrointestinal:  Negative for nausea and vomiting.  Endocrine: Positive for polydipsia.  Neurological:  Negative for dizziness and headaches.  All other systems reviewed and are negative.    Physical Exam Triage Vital Signs ED Triage Vitals  Encounter Vitals Group     BP 01/29/24 1857 (!) 147/104     Girls Systolic BP Percentile --      Girls Diastolic BP Percentile --      Boys Systolic BP Percentile --      Boys Diastolic BP Percentile --      Pulse Rate 01/29/24 1857 94     Resp 01/29/24 1857 18     Temp 01/29/24 1857 98 F (36.7 C)     Temp Source 01/29/24 1857 Oral     SpO2 01/29/24 1857 97 %     Weight 01/29/24 1857 210 lb 15.7 oz (95.7 kg)     Height --      Head Circumference --      Peak Flow --      Pain Score 01/29/24 1855 0     Pain Loc --      Pain Education --      Exclude from Growth Chart --    No data found.  Updated Vital Signs BP (!) 147/104 (BP Location: Left Arm)   Pulse 94   Temp 98 F (36.7 C) (Oral)   Resp 18   Wt 210 lb 15.7 oz (95.7 kg)   LMP 01/05/2024 (Approximate)   SpO2 97%   BMI 37.38 kg/m   Visual Acuity Right Eye Distance:   Left Eye Distance:   Bilateral Distance:    Right Eye Near:   Left Eye Near:    Bilateral Near:     Physical Exam Vitals reviewed.  Constitutional:      General: She is awake. She is not in acute distress.    Appearance: Normal appearance. She is well-developed. She is not ill-appearing, toxic-appearing or  diaphoretic.  HENT:     Head: Normocephalic.     Right Ear: Hearing normal.     Left Ear: Hearing normal.     Nose: Nose normal.     Mouth/Throat:     Mouth: Mucous membranes are moist.  Eyes:     General: Vision grossly intact.     Conjunctiva/sclera: Conjunctivae normal.  Cardiovascular:  Rate and Rhythm: Normal rate and regular rhythm.     Heart sounds: Normal heart sounds.  Pulmonary:     Effort: Pulmonary effort is normal.     Breath sounds: Normal breath sounds and air entry.  Musculoskeletal:        General: Normal range of motion.     Cervical back: Normal range of motion and neck supple.  Skin:    General: Skin is warm and dry.  Neurological:     General: No focal deficit present.     Mental Status: She is alert and oriented to person, place, and time.  Psychiatric:        Speech: Speech normal.        Behavior: Behavior is cooperative.      UC Treatments / Results  Labs (all labs ordered are listed, but only abnormal results are displayed) Labs Reviewed  GLUCOSE, POCT (MANUAL RESULT ENTRY) - Abnormal; Notable for the following components:      Result Value   POC Glucose 280 (*)    All other components within normal limits  POCT URINE DIPSTICK - Abnormal; Notable for the following components:   Clarity, UA hazy (*)    Glucose, UA =500 (*)    Ketones, POC UA small (15) (*)    Blood, UA trace-intact (*)    POC PROTEIN,UA =100 (*)    All other components within normal limits  COMPREHENSIVE METABOLIC PANEL WITH GFR  CBC WITH DIFFERENTIAL/PLATELET  HEMOGLOBIN A1C    EKG   Radiology No results found.  Procedures Procedures (including critical care time)  Medications Ordered in UC Medications - No data to display  Initial Impression / Assessment and Plan / UC Course  I have reviewed the triage vital signs and the nursing notes.  Pertinent labs & imaging results that were available during my care of the patient were reviewed by me and considered  in my medical decision making (see chart for details).     The patient presents with a history of poorly controlled hypertension and diabetes, reporting noncompliance with prescribed medications and follow-up care. She was evaluated after elevated readings were noted at a mobile health screening. In clinic today, her blood pressure was 147/104 mmHg, and point-of-care glucose was 280 mg/dL. Urinalysis revealed glycosuria with small ketones but no evidence of infection. Physical examination was unremarkable, and there were no focal neurological or systemic findings. CBC, CMP, and hemoglobin A1c were obtained, and results are pending.  Given her elevated glucose and blood pressure, as well as documented medication nonadherence, another 30-day supply of lisinopril , glipizide , and metformin  was provided to allow temporary management until she establishes consistent primary care. Extensive counseling was provided regarding the importance of taking medications daily, maintaining close follow-up with a primary care provider, and understanding the potential long-term complications of uncontrolled diabetes and hypertension, including kidney disease, stroke, and heart attack. The patient was advised that urgent care is not an appropriate setting for chronic medication refills and that further refills will not be provided from this clinic.  Supportive care and emergency precautions were reviewed, including the need to seek immediate care for chest pain, shortness of breath, confusion, vomiting, or persistently elevated glucose readings. The patient was given contact information for [facility], a local primary care practice serving the Latino community, and was instructed to call Monday morning to schedule a follow-up appointment as soon as possible for ongoing management of her chronic conditions.  Today's evaluation has revealed no signs of  a dangerous process. Discussed diagnosis with patient  and/or guardian. Patient and/or guardian aware of their diagnosis, possible red flag symptoms to watch out for and need for close follow up. Patient and/or guardian understands verbal and written discharge instructions. Patient and/or guardian comfortable with plan and disposition.  Patient and/or guardian has a clear mental status at this time, good insight into illness (after discussion and teaching) and has clear judgment to make decisions regarding their care  Documentation was completed with the aid of voice recognition software. Transcription may contain typographical errors.  Final Clinical Impressions(s) / UC Diagnoses   Final diagnoses:  Type 2 diabetes mellitus with hyperglycemia, without long-term current use of insulin (HCC)  Nonadherence to medical treatment  Elevated blood pressure reading in office with diagnosis of hypertension  Glycosuria     Discharge Instructions      Usted fue atendida hoy porque se detect que su presin arterial y su nivel de azcar en la sangre estaban elevados durante una revisin de salud comunitaria. Su presin arterial hoy fue de 147/104 y su nivel de azcar fue de 280. El anlisis de comoros mostr azcar y una pequea cantidad de cetonas, pero sin seales de infeccin. Estos resultados son consistentes con su historial de diabetes e hipertensin que actualmente no estn bien controladas.  Se le dio una receta por 30 das de sus medicamentos: lisinopril , glipizida y metformina, para reiniciar su tratamiento y ayudar a Tree surgeon su presin arterial y su nivel de azcar. Es muy importante que tome estos medicamentos todos los [address]  [address] segn las indicaciones, incluso si se siente bien. Saltarse dosis o tomarlos solo cuando se siente mal puede causar que sus niveles de International aid/development worker y presin arterial suban peligrosamente, lo que puede provocar complicaciones graves como dao a los riones, ataque cardaco o derrame cerebral.  Por favor, haga una cita con un  proveedor de atencin primaria lo antes posible para el manejo continuo de su diabetes y presin arterial. Se le dio informacin de [facility], que ofrece atencin mdica asequible y se Advertising copywriter a la comunidad Geologist, engineering. Llame a su oficina el lunes por la maana para programar una visita. En adelante, las recetas de sus medicamentos debern provenir de su proveedor de atencin primaria, no del centro de atencin urgente.  Beba suficiente agua, coma comidas balanceadas y evite saltarse comidas o consumir alimentos con alto contenido de azcar o sal. Trate de revisar su presin arterial y su nivel de azcar todos los [address]  [address] si es posible, y [employer] para llevarlos a su prxima cita.  Vaya al departamento de emergencias de inmediato si presenta dolor en el pecho, dificultad para respirar, mareos, confusin, vmitos, dolor de cabeza intenso, cambios en la visin o si su nivel de azcar se mantiene constantemente por encima de 400 a pesar de tomar sus medicamentos. Estos pueden ser signos de niveles peligrosamente altos de azcar o presin arterial que requieren tratamiento urgente.  You were seen today because your blood pressure and blood sugar were found to be elevated during a community health screening. Your blood pressure today was 147/104, and your blood sugar was 280. Your urine test showed sugar and a small amount of ketones but no signs of infection. These findings are consistent with your history of diabetes and high blood pressure that are not well controlled right now. You were given a 30-day refill of your lisinopril , glipizide , and metformin  to restart your medications and help stabilize your blood pressure and blood sugar.  It is very important that you take these medicines every day as prescribed, even if you feel fine. Skipping doses or taking them only when you don't feel well can cause your blood sugar and blood pressure to become dangerously high, which can  lead to serious complications like kidney damage, heart attack, or stroke. Please make an appointment with a primary care provider as soon as possible for ongoing management of your diabetes and blood pressure. You were given information for [facility], which offers affordable primary care and specializes in serving the Latino community. Call their office on Monday morning to schedule a visit. Going forward, medication refills will need to come from your primary care provider, not urgent care. Drink plenty of water, eat balanced meals, and avoid skipping meals or eating foods high in sugar or salt. Try to check your blood pressure and blood sugar daily if possible and write down your readings to bring to your next appointment. Go to the emergency department immediately if you experience chest pain, shortness of breath, dizziness, confusion, vomiting, severe headache, vision changes, or if your blood sugar remains consistently above 400 despite taking your medication. These could be signs of dangerously high blood sugar or blood pressure that require emergency treatment.     ED Prescriptions     Medication Sig Dispense Auth. Provider   glipiZIDE  (GLUCOTROL ) 10 MG tablet Take 1 tablet (10 mg total) by mouth 2 (two) times daily before a meal. 60 tablet Iola Lukes, FNP   lisinopril  (ZESTRIL ) 20 MG tablet Take 1 tablet (20 mg total) by mouth daily. 30 tablet Stayce Delancy, Hart, FNP   metFORMIN  (GLUCOPHAGE ) 1000 MG tablet Take 1 tablet (1,000 mg total) by mouth 2 (two) times daily with a meal. 60 tablet Iola Lukes, FNP      PDMP not reviewed this encounter.   Iola Lukes, [place] 01/29/24 [phone]

## 2024-01-29 NOTE — Telephone Encounter (Signed)
 Requested medication (s) are due for refill today: routing for review  Requested medication (s) are on the active medication list: yes  Last refill:  09/22/23  Future visit scheduled: no  Notes to clinic:  Unable to refill per protocol, last refill by another provider.      Requested Prescriptions  Pending Prescriptions Disp Refills   lisinopril  (ZESTRIL ) 20 MG tablet 30 tablet 0    Sig: Take 1 tablet (20 mg total) by mouth daily.     Cardiovascular:  ACE Inhibitors Failed - 01/29/2024  3:58 PM      Failed - Cr in normal range and within 180 days    Creat  Date Value Ref Range Status  03/12/2016 0.51 0.50 - 1.10 mg/dL Final   Creatinine, Ser  Date Value Ref Range Status  08/06/2022 0.71 0.57 - 1.00 mg/dL Final   Creatinine, Urine  Date Value Ref Range Status  03/25/2016 51.00 mg/dL Final         Failed - K in normal range and within 180 days    Potassium  Date Value Ref Range Status  08/06/2022 4.4 3.5 - 5.2 mmol/L Final         Failed - Last BP in normal range    BP Readings from Last 1 Encounters:  01/28/24 (!) 147/94         Failed - Valid encounter within last 6 months    Recent Outpatient Visits           2 years ago Type 2 diabetes mellitus with hyperosmolar coma, unspecified whether long term insulin use (HCC)   [place] [facility] Celestia Rosaline SQUIBB, NP   2 years ago Screening for diabetes mellitus   [place] [facility] Celestia Rosaline SQUIBB, NP   3 years ago Vaginal discharge   [place] [facility] Celestia Rosaline SQUIBB, NP   3 years ago Essential hypertension   [place] [facility] Celestia Rosaline SQUIBB, NP   4 years ago Vaginal discharge   [place] [facility] Celestia Rosaline SQUIBB, NP              Passed - Patient is not pregnant

## 2024-02-01 ENCOUNTER — Ambulatory Visit (HOSPITAL_COMMUNITY): Payer: Self-pay

## 2024-02-01 LAB — CBC WITH DIFFERENTIAL/PLATELET
Basophils Absolute: 0 x10E3/uL (ref 0.0–0.2)
Basos: 0 %
EOS (ABSOLUTE): 0.1 x10E3/uL (ref 0.0–0.4)
Eos: 1 %
Hematocrit: 47.2 % — ABNORMAL HIGH (ref 34.0–46.6)
Hemoglobin: 15 g/dL (ref 11.1–15.9)
Immature Grans (Abs): 0 x10E3/uL (ref 0.0–0.1)
Immature Granulocytes: 0 %
Lymphocytes Absolute: 2.3 x10E3/uL (ref 0.7–3.1)
Lymphs: 32 %
MCH: 31.3 pg (ref 26.6–33.0)
MCHC: 31.8 g/dL (ref 31.5–35.7)
MCV: 99 fL — ABNORMAL HIGH (ref 79–97)
Monocytes Absolute: 0.4 x10E3/uL (ref 0.1–0.9)
Monocytes: 5 %
Neutrophils Absolute: 4.5 x10E3/uL (ref 1.4–7.0)
Neutrophils: 62 %
Platelets: 272 x10E3/uL (ref 150–450)
RBC: 4.79 x10E6/uL (ref 3.77–5.28)
RDW: 12.7 % (ref 11.7–15.4)
WBC: 7.2 x10E3/uL (ref 3.4–10.8)

## 2024-02-01 LAB — COMPREHENSIVE METABOLIC PANEL WITH GFR
ALT: 369 IU/L — ABNORMAL HIGH (ref 0–32)
AST: 405 IU/L — ABNORMAL HIGH (ref 0–40)
Albumin: 4.5 g/dL (ref 3.9–4.9)
Alkaline Phosphatase: 172 IU/L — ABNORMAL HIGH (ref 41–116)
BUN/Creatinine Ratio: 16 (ref 9–23)
BUN: 10 mg/dL (ref 6–24)
Bilirubin Total: 0.4 mg/dL (ref 0.0–1.2)
CO2: 17 mmol/L — ABNORMAL LOW (ref 20–29)
Calcium: 9.5 mg/dL (ref 8.7–10.2)
Chloride: 96 mmol/L (ref 96–106)
Creatinine, Ser: 0.63 mg/dL (ref 0.57–1.00)
Globulin, Total: 3.2 g/dL (ref 1.5–4.5)
Glucose: 274 mg/dL — ABNORMAL HIGH (ref 70–99)
Potassium: 4.4 mmol/L (ref 3.5–5.2)
Sodium: 133 mmol/L — ABNORMAL LOW (ref 134–144)
Total Protein: 7.7 g/dL (ref 6.0–8.5)
eGFR: 114 mL/min/1.73 (ref >59)

## 2024-02-01 LAB — HEMOGLOBIN A1C
Est. average glucose Bld gHb Est-mCnc: 286 mg/dL
Hgb A1c MFr Bld: 11.6 % — ABNORMAL HIGH (ref 4.8–5.6)

## 2024-02-04 NOTE — Progress Notes (Signed)
 Elevation of liver function appears to be chronic, with documented abnormalities dating back at least two years. The patient should avoid hepatotoxic agents, including acetaminophen  (Tylenol ) and alcohol. Continued monitoring and primary care provider follow-up is essential for further evaluation and management.

## 2024-02-12 ENCOUNTER — Other Ambulatory Visit: Payer: Self-pay

## 2024-03-09 ENCOUNTER — Ambulatory Visit: Payer: Self-pay | Admitting: Physician Assistant

## 2024-03-09 ENCOUNTER — Encounter: Payer: Self-pay | Admitting: Physician Assistant

## 2024-03-09 VITALS — BP 156/106 | HR 97 | Ht 61.0 in | Wt 201.0 lb

## 2024-03-09 DIAGNOSIS — E1165 Type 2 diabetes mellitus with hyperglycemia: Secondary | ICD-10-CM

## 2024-03-09 DIAGNOSIS — I10 Essential (primary) hypertension: Secondary | ICD-10-CM

## 2024-03-09 DIAGNOSIS — Z760 Encounter for issue of repeat prescription: Secondary | ICD-10-CM

## 2024-03-09 DIAGNOSIS — E1101 Type 2 diabetes mellitus with hyperosmolarity with coma: Secondary | ICD-10-CM

## 2024-03-09 LAB — POCT GLUCOSE FINGERSTICK: Glucose: 357 — AB (ref 70–99)

## 2024-03-09 MED ORDER — LISINOPRIL 20 MG PO TABS: 20.0000 mg | ORAL_TABLET | Freq: Every day | ORAL | 0 refills | Status: DC

## 2024-03-09 MED ORDER — TRUE METRIX BLOOD GLUCOSE TEST VI STRP: 1.0000 | ORAL_STRIP | Freq: Two times a day (BID) | 12 refills | Status: DC

## 2024-03-09 MED ORDER — INSULIN ASPART 100 UNIT/ML IJ SOLN
10.0000 [IU] | Freq: Once | INTRAMUSCULAR | Status: AC
  Administered 2024-03-09: 10 [IU] via SUBCUTANEOUS

## 2024-03-09 MED ORDER — TRUE METRIX METER W/DEVICE KIT: 1.0000 [IU] | PACK | Freq: Once | 0 refills | Status: AC

## 2024-03-09 MED ORDER — TRUEPLUS LANCETS 28G MISC: 1.0000 | Freq: Two times a day (BID) | 11 refills | Status: DC

## 2024-03-09 MED ORDER — GLIPIZIDE 10 MG PO TABS: 10.0000 mg | ORAL_TABLET | Freq: Two times a day (BID) | ORAL | 0 refills | Status: DC

## 2024-03-09 NOTE — Patient Instructions (Addendum)
 VISIT SUMMARY:  Today, we discussed your elevated blood sugar and blood pressure, which are due to not taking your medications as prescribed. We reviewed your current medications and made a plan to help you get back on track.  YOUR PLAN:  -TYPE 2 DIABETES MELLITUS WITH HYPERGLYCEMIA: Type 2 diabetes is a condition where your body does not use insulin  properly, leading to high blood sugar levels. Your blood sugar was very high at 367 mg/dL. You should restart your Trulicity  injections tonight and continue them weekly. Please check and record your blood sugar daily. Your prescriptions have been sent to the pharmacy, and we will follow up in two weeks.  -ESSENTIAL HYPERTENSION: Hypertension, or high blood pressure, is when the force of your blood against your artery walls is too high. This can lead to serious health problems if not managed. Your blood pressure is elevated because you have not been taking your medication. A new prescription has been sent to the pharmacy. We will follow up in two weeks.  Hiperglucemia Hyperglycemia La hiperglucemia ocurre cuando la cantidad de azcar, o glucosa, en la sangre es demasiado alta. El nivel alto de azcar en la sangre puede ocurrir tanto si tiene diabetes como si no tiene diabetes. Puede ser ignacia emergencia. Cules son las causas? Si tiene diabetes, el restaurant manager, fast food de azcar en la sangre puede deberse a lo siguiente: Medicamentos que pensions consultant. No administrarse suficiente insulina (si la usa ). Ser menos activo de lo normal. Comer ms de lo previsto. Enfermedad, una lesin o una infeccin. Someterse a una ciruga. Estrs. Si no tiene diabetes, el nivel alto de azcar en la sangre puede deberse a lo siguiente: Ciertos medicamentos, como los corticoesteroides o los diurticos tiazdicos. Estrs. Una enfermedad o infeccin grave. Someterse a una ciruga. Enfermedades del pncreas. Qu incrementa el riesgo? Es ms probable que  tenga un nivel alto de azcar en la sangre si: Alguien de su familia tiene diabetes. Tiene sobrepeso. No hace actividad fsica. Tiene o ha tenido lo siguiente: Prediabetes. Diabetes durante el embarazo. Sndrome del ovario poliqustico (SOP). Cules son los signos o sntomas? Es posible que el nivel alto de azcar en la sangre no cause sntomas. Si tiene sntomas, pueden incluir los siguientes: Estar ms sediento que lo habitual. Necesidad de hacer pis con mayor frecuencia que lo normal. Hambre. Mucho cansancio. Visin borrosa. Puede tener otros sntomas si no se trata el nivel alto de banker. Pueden incluir: Tree surgeon. Dolor de cabeza. Debilidad. Prdida de peso no planificada. Hormigueo o adormecimiento en las manos o los pies. Cortes o moretones que tardan en curarse. Cmo se diagnostica?  El nivel alto de azcar en la sangre se diagnostica con un anlisis de [place]. Ese anlisis le indica cunta azcar hay en la sangre. Se realiza mientras tiene sntomas.  El mdico tambin puede hacerle un examen fsico, revisar sus antecedentes mdicos y hacerle ms anlisis de sangre. Estas pruebas pueden incluir lo siguiente: Prueba de la glucemia en ayunas (PGA). No puede comer durante al menos 8 horas antes de esta prueba. Anlisis de A1c. Prueba de tolerancia a la glucosa. Cmo se trata? El tratamiento puede incluir: Tomar medicamentos para chief operating officer los niveles de azcar en la sangre. Cambiar los medicamentos o la cantidad que se aplica si recibe insulina o toma otros medicamentos para la diabetes. Controlarse el nivel de azcar en la sangre con mayor frecuencia. Hacer cambios en su vida diaria. Pueden incluir: Ser ms activo.  Consumir alimentos ms saludables. Bajar de [place]. Tratar una enfermedad o afeccin. Dejar de tomar corticoesteroides o scientist, clinical (histocompatibility and immunogenetics). Si su nivel alto de academic librarian, es posible que deba recibir tratamiento en el  hospital. Siga estas instrucciones en su casa: Si usted tiene diabetes:  Conozca los sntomas de nivel alto de azcar en la sangre. Siga su plan de atencin de la diabetes. Asegrese de hacer lo siguiente: Aplquese la insulina y tome los medicamentos como se lo hayan indicado. Contrlese el nivel de azcar en la sangre con la frecuencia que le hayan indicado. Coma a horario. No omita comidas. Controle su nivel de azcar en la sangre antes y despus de ejercitarse. Si hace ejercicio durante ms tiempo o con mayor esfuerzo de lo habitual, controle su nivel de azcar en la sangre con mayor frecuencia. Siga su plan para los das de enfermedad cuando no pueda comer ni beber normalmente. Elabore este plan de antemano con el mdico. Comparta su plan de atencin de la diabetes con: Sus compaeros de trabajo o de la escuela. Las personas con las que [place]. Use un brazalete o lleve consigo una tarjeta que diga que tiene diabetes. Instrucciones generales Use los medicamentos solamente como se lo haya indicado el mdico. Beba suficiente lquido para mantener el pis (orina) de color amarillo plido. Asegrese de beber suficiente lquido cuando: Realice actividad fsica. Se enferme. Est en lugares calurosos. Si bebe alcohol: Limite la cantidad que bebe a lo siguiente: De 0 a 1 medida al da si es [place]. De 0 a 2 medidas al da si es varn. Sepa cunta cantidad de alcohol hay en las bebidas que toma. En los [address], una medida es una botella de cerveza de 12 oz (355 ml), un vaso de vino de 5 oz (148 ml) o un vaso de una bebida alcohlica de alta graduacin de 1 oz (44 ml). Controle el estrs. Si necesita ayuda para lograrlo, consulte a su mdico. Haga ejercicio segn las indicaciones. Intente mantener un peso saludable. Concurra a todas las visitas de seguimiento. El mdico querr asegurarse de que se trate el nivel alto de banker. Dnde obtener ms informacin American  Diabetes Association (ADA) (Asociacin Estadounidense de la Diabetes): diabetes.org Comunquese con un mdico si: Tiene diabetes y tiene problemas para futures trader de azcar en la sangre dentro del rango adecuado. El nivel de azcar en la sangre es igual o mayor que 240 mg/dl (86.6 mmol/dl) durante 2 das seguidos. Tiene un nivel de azcar en la sangre alto con frecuencia. Tiene signos de enfermedad, por ejemplo: Nuseas o vmitos. Dolor de cabeza. [place]. No puede dejar de vomitar. Solicite ayuda de inmediato si: El monitor de azcar en la sangre indica un nivel "alto" incluso cuando se est administrando insulina. Tiene dificultad para respirar. Estos sntomas pueden customer service manager. Llame al 911 de inmediato. No espere a ver si los sntomas desaparecen. No conduzca por sus propios medios officemax incorporated. Esta informacin no tiene theme park manager el consejo del mdico. Asegrese de hacerle al mdico cualquier pregunta que tenga. Document Revised: 08/06/2022 Document Reviewed: 08/06/2022 Elsevier Patient Education  2024 [employer].

## 2024-03-09 NOTE — Progress Notes (Signed)
 Established Patient Office Visit  Subjective   Patient ID: Tara [name], female    DOB: May 12, 1980  Age: 43 y.o. MRN: [id number]  Chief Complaint  Patient presents with   Medication Refill  Discussed the use of AI scribe software for clinical note transcription with the patient, who gave verbal consent to proceed.  History of Present Illness    Tara [name] is a 43 year old female with diabetes and hypertension who presents with elevated blood sugar and medication non-compliance.  She has not taken glipizide  for about two months after running out, and her recent blood sugar was 367 mg/dL. She has a once-weekly insulin  injection at home but has not used it for about a month because she felt well.  She ran out of her blood pressure medication about a week ago and has not taken it since. She cannot check her blood pressure at home due to lack of equipment.  She does not have a glucometer and lacks insurance to cover the cost. She prefers to pick up medications from [facility]. She reports no acute symptoms.  Due to language barrier, an interpreter was present during the history-taking and subsequent discussion (and for part of the physical exam) with this patient.       Past Medical History:  Diagnosis Date   Diabetes mellitus (HCC)    type 2   Elevated LFTs    Fatty liver    Hypertension    no meds   Obesity    Social History   Socioeconomic History   Marital status: Single    Spouse name: Not on file   Number of children: Not on file   Years of education: Not on file   Highest education level: Not on file  Occupational History   Not on file  Tobacco Use   Smoking status: Never    Passive exposure: Never   Smokeless tobacco: Never  Vaping Use   Vaping status: Never Used  Substance and Sexual Activity   Alcohol use: No   Drug use: No   Sexual activity: Yes    Birth control/protection: None  Other Topics Concern   Not on file  Social  History Narrative   Not on file   Social Drivers of Health   Financial Resource Strain: Not on file  Food Insecurity: No Food Insecurity (03/09/2024)   Hunger Vital Sign    Worried About Running Out of Food in the Last Year: Never true    Ran Out of Food in the Last Year: Never true  Transportation Needs: No Transportation Needs (03/09/2024)   PRAPARE - Administrator, Civil Service (Medical): No    Lack of Transportation (Non-Medical): No  Physical Activity: Not on file  Stress: Not on file  Social Connections: Not on file  Intimate Partner Violence: Not At Risk (03/09/2024)   Humiliation, Afraid, Rape, and Kick questionnaire    Fear of Current or Ex-Partner: No    Emotionally Abused: No    Physically Abused: No    Sexually Abused: No   Family History  Problem Relation Age of Onset   Diabetes Mother    Hypertension Mother    Allergies  Allergen Reactions   Penicillins Anaphylaxis   Amoxicillin  Hives    Review of Systems  Constitutional: Negative.   HENT: Negative.    Eyes: Negative.  Negative for blurred vision.  Respiratory:  Negative for shortness of breath.   Cardiovascular:  Negative for chest pain.  Gastrointestinal: Negative.  Genitourinary: Negative.   Musculoskeletal: Negative.   Skin: Negative.   Neurological: Negative.   Endo/Heme/Allergies: Negative.   Psychiatric/Behavioral: Negative.        Objective:     BP (!) 156/106   Pulse 97   Ht 5' 1 (1.549 m)   Wt 201 lb (91.2 kg)   SpO2 97%   BMI 37.98 kg/m  BP Readings from Last 3 Encounters:  03/09/24 (!) 156/106  01/29/24 (!) 147/104  01/28/24 (!) 147/94   Wt Readings from Last 3 Encounters:  03/09/24 201 lb (91.2 kg)  01/29/24 210 lb 15.7 oz (95.7 kg)  09/22/23 210 lb 15.7 oz (95.7 kg)    Physical Exam Vitals and nursing note reviewed.  Constitutional:      Appearance: Normal appearance. She is obese.  HENT:     Head: Normocephalic and atraumatic.     Right Ear:  External ear normal.     Left Ear: External ear normal.     Nose: Nose normal.     Mouth/Throat:     Mouth: Mucous membranes are moist.     Pharynx: Oropharynx is clear.  Eyes:     Extraocular Movements: Extraocular movements intact.     Conjunctiva/sclera: Conjunctivae normal.     Pupils: Pupils are equal, round, and reactive to light.  Cardiovascular:     Rate and Rhythm: Normal rate and regular rhythm.     Pulses: Normal pulses.     Heart sounds: Normal heart sounds.  Pulmonary:     Effort: Pulmonary effort is normal.     Breath sounds: Normal breath sounds.  Musculoskeletal:        General: Normal range of motion.     Cervical back: Normal range of motion and neck supple.  Skin:    General: Skin is warm and dry.  Neurological:     General: No focal deficit present.     Mental Status: She is alert and oriented to person, place, and time.  Psychiatric:        Mood and Affect: Mood normal.        Behavior: Behavior normal.        Thought Content: Thought content normal.        Judgment: Judgment normal.        Assessment & Plan:   Problem List Items Addressed This Visit       Cardiovascular and Mediastinum   Essential hypertension   Relevant Medications   lisinopril  (ZESTRIL ) 20 MG tablet     Endocrine   Type 2 diabetes mellitus with hyperglycemia, without long-term current use of insulin  (HCC) - Primary   Relevant Medications   lisinopril  (ZESTRIL ) 20 MG tablet   glipiZIDE  (GLUCOTROL ) 10 MG tablet   Blood Glucose Monitoring Suppl (TRUE METRIX METER) w/Device KIT   glucose blood (TRUE METRIX BLOOD GLUCOSE TEST) test strip   TRUEplus Lancets 28G MISC   Other Visit Diagnoses       Type 2 diabetes mellitus with hyperosmolar coma, unspecified whether long term insulin  use (HCC)       Relevant Medications   lisinopril  (ZESTRIL ) 20 MG tablet   glipiZIDE  (GLUCOTROL ) 10 MG tablet   insulin  aspart (novoLOG ) injection 10 Units (Completed)   Other Relevant Orders    POCT Glucose Fingerstick (Completed)     Medication refill           Results LABS   Blood Glucose: 367 mg/dL (88/73/7974)  Assessment and Plan Type 2 diabetes mellitus with hyperglycemia Blood  glucose at 367 mg/dL. Non-compliance with glipizide  and Trulicity . Declined immediate insulin  injection. - Restart Trulicity  tonight, continue weekly. Restart glipizide , continue metformin  - Check and record blood glucose daily. - Sent prescriptions to pharmacy. - Follow-up in two weeks.  Essential hypertension Elevated blood pressure due to non-compliance with antihypertensive medication. - Sent antihypertensive prescription to pharmacy. - Follow-up in two weeks  The patient was given clear instructions to go to ER or return to medical center if symptoms don't improve, worsen or new problems develop. The patient verbalized understanding.    I have reviewed the patient's medical history (PMH, PSH, Social History, Family History, Medications, and allergies) , and have been updated if relevant. I spent 30 minutes reviewing chart and  face to face time with patient.   Return in about 2 weeks (around 03/23/2024) for With [facility].    Kirk RAMAN Mayers, PA-C

## 2024-03-21 ENCOUNTER — Telehealth: Payer: Self-pay | Admitting: Physician Assistant

## 2024-03-21 NOTE — Telephone Encounter (Signed)
 Contacted pt to schedule follow up visit. No answer/unable to leave vm

## 2024-04-18 ENCOUNTER — Other Ambulatory Visit: Payer: Self-pay

## 2024-04-18 ENCOUNTER — Ambulatory Visit
Admission: EM | Admit: 2024-04-18 | Discharge: 2024-04-18 | Disposition: A | Discharge status: Home / Self Care | Payer: Self-pay

## 2024-04-18 DIAGNOSIS — E1101 Type 2 diabetes mellitus with hyperosmolarity with coma: Secondary | ICD-10-CM

## 2024-04-18 DIAGNOSIS — E119 Type 2 diabetes mellitus without complications: Secondary | ICD-10-CM

## 2024-04-18 DIAGNOSIS — E1165 Type 2 diabetes mellitus with hyperglycemia: Secondary | ICD-10-CM

## 2024-04-18 DIAGNOSIS — Z7984 Long term (current) use of oral hypoglycemic drugs: Secondary | ICD-10-CM

## 2024-04-18 DIAGNOSIS — I10 Essential (primary) hypertension: Secondary | ICD-10-CM

## 2024-04-18 DIAGNOSIS — Z760 Encounter for issue of repeat prescription: Secondary | ICD-10-CM

## 2024-04-18 LAB — GLUCOSE, POCT (MANUAL RESULT ENTRY): POC Glucose: 342 mg/dL — AB (ref 70–99)

## 2024-04-18 MED ORDER — LISINOPRIL 20 MG PO TABS
20.0000 mg | ORAL_TABLET | Freq: Every day | ORAL | 0 refills | Status: DC
  Filled 2024-04-18: qty 30, 30d supply, fill #0

## 2024-04-18 MED ORDER — GLIPIZIDE 10 MG PO TABS
10.0000 mg | ORAL_TABLET | Freq: Two times a day (BID) | ORAL | 0 refills | Status: DC
  Filled 2024-04-18: qty 60, 30d supply, fill #0

## 2024-04-18 MED ORDER — METFORMIN HCL 1000 MG PO TABS
1000.0000 mg | ORAL_TABLET | Freq: Two times a day (BID) | ORAL | 0 refills | Status: DC
  Filled 2024-04-18: qty 60, 30d supply, fill #0

## 2024-04-18 NOTE — ED Triage Notes (Signed)
 Pt present for medication refill. States she needs a refill on her Lisinopril  and glipiZIDE .

## 2024-04-18 NOTE — ED Provider Notes (Signed)
 " [name]    CSN: [id number] Arrival date & time: 04/18/24  1455      History   Chief Complaint Chief Complaint  Patient presents with   Medication Refill    HPI Tara [name] is a 44 y.o. female.   Pt presents today for refills of metformin , lisinopril , and glipizide . Pt is also asking for glucose to be checked.   The history is provided by the patient.  Medication Refill   Past Medical History:  Diagnosis Date   Diabetes mellitus (HCC)    type 2   Elevated LFTs    Fatty liver    Hypertension    no meds   Obesity     Patient Active Problem List   Diagnosis Date Noted   Type 2 diabetes mellitus with hyperglycemia, without long-term current use of insulin  (HCC) 04/28/2023   Essential hypertension 04/28/2023   Fatty liver 05/16/2019   Encounter for IUD removal 12/08/2016   Contraception management 06/13/2016   Elevated blood pressure reading 06/13/2016   Preeclampsia, third trimester 03/25/2016   GERD (gastroesophageal reflux disease) 03/05/2016   Previous cesarean delivery, antepartum condition or complication 02/04/2016   Encounter for supervision of normal pregnancy 11/27/2015    Past Surgical History:  Procedure Laterality Date   CESAREAN SECTION     THERAPEUTIC ABORTION      OB History     Gravida  4   Para  3   Term  3   Preterm  0   AB  1   Living  3      SAB  0   IAB  1   Ectopic  0   Multiple  0   Live Births  3            Home Medications    Prior to Admission medications  Medication Sig Start Date End Date Taking? Authorizing Provider  TRUEplus Lancets 28G MISC 1 each by Does not apply route 2 (two) times daily at 8 am and 10 pm. 03/09/24  Yes Mayers, Cari S, PA-C  glipiZIDE  (GLUCOTROL ) 10 MG tablet Take 1 tablet (10 mg total) by mouth 2 (two) times daily before a meal. 04/18/24   Andra Krabbe C, PA-C  glucose blood (TRUE METRIX BLOOD GLUCOSE TEST) test strip 1 each by Other route 2  (two) times daily. Use as instructed 03/09/24   Mayers, Cari S, PA-C  lisinopril  (ZESTRIL ) 20 MG tablet Take 1 tablet (20 mg total) by mouth daily. 04/18/24   Andra Krabbe BROCKS, PA-C  metFORMIN  (GLUCOPHAGE ) 1000 MG tablet Take 1 tablet (1,000 mg total) by mouth 2 (two) times daily with a meal. 04/18/24   Andra Krabbe BROCKS, PA-C    Family History Family History  Problem Relation Age of Onset   Diabetes Mother    Hypertension Mother     Social History Social History[1]   Allergies   Penicillins and Amoxicillin    Review of Systems Review of Systems   Physical Exam Triage Vital Signs ED Triage Vitals  Encounter Vitals Group     BP 04/18/24 1521 (!) 146/102     Girls Systolic BP Percentile --      Girls Diastolic BP Percentile --      Boys Systolic BP Percentile --      Boys Diastolic BP Percentile --      Pulse Rate 04/18/24 1521 95     Resp 04/18/24 1521 16     Temp 04/18/24 1521 98 F (36.7  C)     Temp Source 04/18/24 1521 Oral     SpO2 04/18/24 1521 98 %     Weight --      Height --      Head Circumference --      Peak Flow --      Pain Score 04/18/24 1520 0     Pain Loc --      Pain Education --      Exclude from Growth Chart --    No data found.  Updated Vital Signs BP (!) 146/102 (BP Location: Left Arm)   Pulse 95   Temp 98 F (36.7 C) (Oral)   Resp 16   LMP 03/18/2024 (Approximate)   SpO2 98%   Visual Acuity Right Eye Distance:   Left Eye Distance:   Bilateral Distance:    Right Eye Near:   Left Eye Near:    Bilateral Near:     Physical Exam   UC Treatments / Results  Labs (all labs ordered are listed, but only abnormal results are displayed) Labs Reviewed  GLUCOSE, POCT (MANUAL RESULT ENTRY) - Abnormal; Notable for the following components:      Result Value   POC Glucose 342 (*)    All other components within normal limits    EKG   Radiology No results found.  Procedures Procedures (including critical care  time)  Medications Ordered in UC Medications - No data to display  Initial Impression / Assessment and Plan / UC Course  I have reviewed the triage vital signs and the nursing notes.  Pertinent labs & imaging results that were available during my care of the patient were reviewed by me and considered in my medical decision making (see chart for details).     Final Clinical Impressions(s) / UC Diagnoses   Final diagnoses:  Essential hypertension  Type 2 diabetes mellitus without complication, without long-term current use of insulin  [facility])   Discharge Instructions   None    ED Prescriptions     Medication Sig Dispense Auth. Provider   glipiZIDE  (GLUCOTROL ) 10 MG tablet Take 1 tablet (10 mg total) by mouth 2 (two) times daily before a meal. 60 tablet Andra Krabbe C, PA-C   lisinopril  (ZESTRIL ) 20 MG tablet Take 1 tablet (20 mg total) by mouth daily. 30 tablet Andra Krabbe C, PA-C   metFORMIN  (GLUCOPHAGE ) 1000 MG tablet Take 1 tablet (1,000 mg total) by mouth 2 (two) times daily with a meal. 60 tablet Andra Krabbe BROCKS, PA-C      PDMP not reviewed this encounter.    [1]  Social History Tobacco Use   Smoking status: Never    Passive exposure: Never   Smokeless tobacco: Never  Vaping Use   Vaping status: Never Used  Substance Use Topics   Alcohol use: No   Drug use: No     Andra Krabbe BROCKS, PA-C 04/18/24 1600  "

## 2024-04-19 ENCOUNTER — Other Ambulatory Visit: Payer: Self-pay

## 2024-05-18 ENCOUNTER — Encounter: Payer: Self-pay | Admitting: Physician Assistant

## 2024-05-18 ENCOUNTER — Ambulatory Visit: Payer: Self-pay | Admitting: Physician Assistant

## 2024-05-18 ENCOUNTER — Other Ambulatory Visit: Payer: Self-pay

## 2024-05-18 VITALS — BP 155/97 | HR 84 | Ht 61.0 in | Wt 210.0 lb

## 2024-05-18 DIAGNOSIS — I10 Essential (primary) hypertension: Secondary | ICD-10-CM

## 2024-05-18 DIAGNOSIS — K760 Fatty (change of) liver, not elsewhere classified: Secondary | ICD-10-CM

## 2024-05-18 DIAGNOSIS — R748 Abnormal levels of other serum enzymes: Secondary | ICD-10-CM

## 2024-05-18 DIAGNOSIS — E1165 Type 2 diabetes mellitus with hyperglycemia: Secondary | ICD-10-CM

## 2024-05-18 MED ORDER — LISINOPRIL 40 MG PO TABS
40.0000 mg | ORAL_TABLET | Freq: Every day | ORAL | 1 refills | Status: AC
  Filled 2024-05-18: qty 30, 30d supply, fill #0

## 2024-05-18 MED ORDER — GLIPIZIDE 10 MG PO TABS
10.0000 mg | ORAL_TABLET | Freq: Two times a day (BID) | ORAL | 0 refills | Status: AC
  Filled 2024-05-18: qty 60, 30d supply, fill #0

## 2024-05-18 MED ORDER — TRUEPLUS LANCETS 28G MISC
1.0000 | Freq: Two times a day (BID) | 11 refills | Status: AC
  Filled 2024-05-18: qty 100, 50d supply, fill #0

## 2024-05-18 MED ORDER — METFORMIN HCL 1000 MG PO TABS
1000.0000 mg | ORAL_TABLET | Freq: Two times a day (BID) | ORAL | 0 refills | Status: AC
  Filled 2024-05-18: qty 60, 30d supply, fill #0

## 2024-05-18 MED ORDER — TRUE METRIX METER W/DEVICE KIT
1.0000 [IU] | PACK | Freq: Every day | 0 refills | Status: AC
  Filled 2024-05-18: qty 1, 30d supply, fill #0

## 2024-05-18 MED ORDER — TRUE METRIX BLOOD GLUCOSE TEST VI STRP
1.0000 | ORAL_STRIP | Freq: Two times a day (BID) | 12 refills | Status: AC
  Filled 2024-05-18: qty 100, 50d supply, fill #0

## 2024-05-18 MED ORDER — TRULICITY 0.75 MG/0.5ML ~~LOC~~ SOAJ
0.7500 mg | SUBCUTANEOUS | 4 refills | Status: AC
  Filled 2024-05-18: qty 2, 28d supply, fill #0

## 2024-05-18 NOTE — Progress Notes (Signed)
 "  Established Patient Office Visit  Subjective   Patient ID: Tara [name], female    DOB: 11-30-1980  Age: 44 y.o. MRN: [id number]  Chief Complaint  Patient presents with   Diabetes    Follow up on chronic health conditions and medication management   Discussed the use of AI scribe software for clinical note transcription with the patient, who gave verbal consent to proceed.  History of Present Illness   Tara [name] is a 44 year old female with diabetes and hypertension who presents for medication management and equipment issues.  She reports her temporary TruMetrix glucometer from [employer] is malfunctioning with an error message and she cannot reliably check her blood glucose. She is taking metformin  and glipizide  twice daily but has been off Trulicity  for about one month after running out. For hypertension, she took her last lisinopril  dose this morning and has no refills or home blood pressure monitor.  Due to language barrier, an interpreter was present during the history-taking and subsequent discussion (and for part of the physical exam) with this patient.      Past Medical History:  Diagnosis Date   Diabetes mellitus (HCC)    type 2   Elevated LFTs    Fatty liver    Hypertension    no meds   Obesity    Social History   Socioeconomic History   Marital status: Single    Spouse name: Not on file   Number of children: Not on file   Years of education: Not on file   Highest education level: Not on file  Occupational History   Not on file  Tobacco Use   Smoking status: Never    Passive exposure: Never   Smokeless tobacco: Never  Vaping Use   Vaping status: Never Used  Substance and Sexual Activity   Alcohol use: No   Drug use: No   Sexual activity: Yes    Birth control/protection: None  Other Topics Concern   Not on file  Social History Narrative   Not on file   Social Drivers of Health   Tobacco Use: Low Risk (05/18/2024)   Patient  History    Smoking Tobacco Use: Never    Smokeless Tobacco Use: Never    Passive Exposure: Never  Financial Resource Strain: Not on file  Food Insecurity: No Food Insecurity (03/09/2024)   Epic    Worried About Programme Researcher, Broadcasting/film/video in the Last Year: Never true    Ran Out of Food in the Last Year: Never true  Transportation Needs: No Transportation Needs (03/09/2024)   Epic    Lack of Transportation (Medical): No    Lack of Transportation (Non-Medical): No  Physical Activity: Not on file  Stress: Not on file  Social Connections: Not on file  Intimate Partner Violence: Not At Risk (03/09/2024)   Epic    Fear of Current or Ex-Partner: No    Emotionally Abused: No    Physically Abused: No    Sexually Abused: No  Depression (PHQ2-9): Low Risk (03/09/2024)   Depression (PHQ2-9)    PHQ-2 Score: 0  Alcohol Screen: Not on file  Housing: Low Risk (03/09/2024)   Epic    Unable to Pay for Housing in the Last Year: No    Number of Times Moved in the Last Year: 0    Homeless in the Last Year: No  Utilities: Not At Risk (03/09/2024)   Epic    Threatened with loss of utilities: No  Health  Literacy: Not on file   Family History  Problem Relation Age of Onset   Diabetes Mother    Hypertension Mother    Allergies[1]  Review of Systems  Constitutional: Negative.   HENT: Negative.    Eyes: Negative.   Respiratory:  Negative for shortness of breath.   Cardiovascular:  Negative for chest pain.  Gastrointestinal: Negative.   Genitourinary: Negative.   Musculoskeletal: Negative.   Skin: Negative.   Neurological: Negative.   Endo/Heme/Allergies: Negative.   Psychiatric/Behavioral: Negative.        Objective:     BP (!) 155/97 (BP Location: Left Arm, Patient Position: Sitting)   Pulse 84   Ht 5' 1 (1.549 m)   Wt 210 lb (95.3 kg)   LMP 03/18/2024 (Approximate)   SpO2 98%   BMI 39.68 kg/m  BP Readings from Last 3 Encounters:  05/18/24 (!) 155/97  04/18/24 (!) 146/102   03/09/24 (!) 156/106   Wt Readings from Last 3 Encounters:  05/18/24 210 lb (95.3 kg)  03/09/24 201 lb (91.2 kg)  01/29/24 210 lb 15.7 oz (95.7 kg)    Physical Exam Vitals and nursing note reviewed.    GENERAL: Alert, cooperative, well developed, no acute distress. HEENT: Normocephalic, normal oropharynx, moist mucous membranes. CHEST: Clear to auscultation bilaterally, no wheezes, rhonchi, or crackles. CARDIOVASCULAR: Normal heart rate and rhythm, S1 and S2 normal without murmurs. EXTREMITIES: No cyanosis or edema. NEUROLOGICAL: Cranial nerves grossly intact, moves all extremities without gross motor or sensory deficit.   Assessment & Plan:   Problem List Items Addressed This Visit       Cardiovascular and Mediastinum   Essential hypertension   Relevant Medications   lisinopril  (ZESTRIL ) 40 MG tablet     Digestive   Fatty liver     Endocrine   Type 2 diabetes mellitus with hyperglycemia, without long-term current use of insulin  (HCC) - Primary   Relevant Medications   Blood Glucose Monitoring Suppl (TRUE METRIX METER) w/Device KIT   Dulaglutide  (TRULICITY ) 0.75 MG/0.5ML SOAJ   metFORMIN  (GLUCOPHAGE ) 1000 MG tablet   lisinopril  (ZESTRIL ) 40 MG tablet   glipiZIDE  (GLUCOTROL ) 10 MG tablet   glucose blood (TRUE METRIX BLOOD GLUCOSE TEST) test strip   TRUEplus Lancets 28G MISC   Other Relevant Orders   Comp. Metabolic Panel (12)   Microalbumin / creatinine urine ratio   Vitamin D , 25-hydroxy   TSH   Hemoglobin A1c   Other Visit Diagnoses       Elevated blood pressure reading in office with diagnosis of hypertension       Relevant Medications   lisinopril  (ZESTRIL ) 40 MG tablet     Elevated liver enzymes          1. Type 2 diabetes mellitus with hyperglycemia, without long-term current use of insulin  (HCC) (Primary) Patient encouraged to check blood glucose levels at home, keep a written log and have available for all office visits.  Patient given appointment  to follow-up with primary care provider.  Patient encouraged to return to mobile unit as needed.  Red flags given for prompt reevaluation. - Blood Glucose Monitoring Suppl (TRUE METRIX METER) w/Device KIT; 1 Units by Does not apply route daily.  Dispense: 1 kit; Refill: 0 - Dulaglutide  (TRULICITY ) 0.75 MG/0.5ML SOAJ; Inject 0.75 mg into the skin once a week.  Dispense: 2 mL; Refill: 4 - metFORMIN  (GLUCOPHAGE ) 1000 MG tablet; Take 1 tablet (1,000 mg total) by mouth 2 (two) times daily with a meal.  Dispense: 60  tablet; Refill: 0 - glipiZIDE  (GLUCOTROL ) 10 MG tablet; Take 1 tablet (10 mg total) by mouth 2 (two) times daily before a meal.  Dispense: 60 tablet; Refill: 0 - glucose blood (TRUE METRIX BLOOD GLUCOSE TEST) test strip; 1 each by Other route 2 (two) times daily. Use as instructed  Dispense: 100 each; Refill: 12 - TRUEplus Lancets 28G MISC; Use 2 (two) times daily at 8 am and 10 pm.  Dispense: 100 each; Refill: 11 - Comp. Metabolic Panel (12) - Microalbumin / creatinine urine ratio - Vitamin D , 25-hydroxy - TSH - Hemoglobin A1c  2. Essential hypertension Increase lisinopril  40 mg once daily.  Patient has appointment to follow-up with primary care provider in 3 weeks.  Patient encouraged to check blood pressure at home, keep a written log and have available for all office visits.  Patient encouraged to return the mobile unit as needed. - lisinopril  (ZESTRIL ) 40 MG tablet; Take 1 tablet (40 mg total) by mouth daily.  Dispense: 30 tablet; Refill: 1  3. Elevated blood pressure reading in office with diagnosis of hypertension   4. Elevated liver enzymes History of fatty liver, last imaging in 2021, consider repeat imaging if elevation remains.  5. Fatty liver    I have reviewed the patient's medical history (PMH, PSH, Social History, Family History, Medications, and allergies) , and have been updated if relevant. I spent 30 minutes reviewing chart and  face to face time with  patient.       Return in about 3 weeks (around 06/08/2024) for with Rosaline Bohr .    Kunal Levario S Mayers, PA-C      [1]  Allergies Allergen Reactions   Penicillins Anaphylaxis   Amoxicillin  Hives   "

## 2024-05-18 NOTE — Patient Instructions (Signed)
 VISIT SUMMARY:  Tara [name], you visited today for medication management and equipment issues related to your diabetes and hypertension. We discussed your current medications and the problems you are experiencing with your glucometer and blood pressure monitoring.  YOUR PLAN:  -TYPE 2 DIABETES MELLITUS WITH HYPERGLYCEMIA: Type 2 diabetes mellitus is a condition where your body does not use insulin  properly, leading to high blood sugar levels. We have sent a prescription for Trulicity  to your pharmacy and ensured that your metformin  and glipizide  prescriptions are up to date. Additionally, we sent a prescription for TruMetric test strips to help you monitor your blood glucose levels accurately.  -ESSENTIAL HYPERTENSION: Essential hypertension is high blood pressure with no identifiable cause. Your blood pressure was elevated due to a missed dose of lisinopril  and lack of home monitoring. We have increased your lisinopril  dose to 40 mg daily and arranged an appointment with your primary care provider for further management.

## 2024-05-19 ENCOUNTER — Ambulatory Visit: Payer: Self-pay | Admitting: Physician Assistant

## 2024-05-19 ENCOUNTER — Other Ambulatory Visit: Payer: Self-pay

## 2024-05-19 DIAGNOSIS — R748 Abnormal levels of other serum enzymes: Secondary | ICD-10-CM

## 2024-05-19 DIAGNOSIS — E559 Vitamin D deficiency, unspecified: Secondary | ICD-10-CM

## 2024-05-19 DIAGNOSIS — K760 Fatty (change of) liver, not elsewhere classified: Secondary | ICD-10-CM

## 2024-05-19 LAB — COMP. METABOLIC PANEL (12)
AST: 195 [IU]/L — ABNORMAL HIGH (ref 0–40)
Albumin: 4.2 g/dL (ref 3.9–4.9)
Alkaline Phosphatase: 201 [IU]/L — ABNORMAL HIGH (ref 41–116)
BUN/Creatinine Ratio: 16 (ref 9–23)
BUN: 9 mg/dL (ref 6–24)
Bilirubin Total: 0.3 mg/dL (ref 0.0–1.2)
Calcium: 9.1 mg/dL (ref 8.7–10.2)
Chloride: 95 mmol/L — ABNORMAL LOW (ref 96–106)
Creatinine, Ser: 0.58 mg/dL (ref 0.57–1.00)
Globulin, Total: 3.1 g/dL (ref 1.5–4.5)
Glucose: 284 mg/dL — ABNORMAL HIGH (ref 70–99)
Potassium: 4.7 mmol/L (ref 3.5–5.2)
Sodium: 133 mmol/L — ABNORMAL LOW (ref 134–144)
Total Protein: 7.3 g/dL (ref 6.0–8.5)
eGFR: 116 mL/min/{1.73_m2}

## 2024-05-19 LAB — MICROALBUMIN / CREATININE URINE RATIO
Creatinine, Urine: 10.8 mg/dL
Microalb/Creat Ratio: 239 mg/g{creat} — ABNORMAL HIGH (ref 0–29)
Microalbumin, Urine: 25.8 ug/mL

## 2024-05-19 LAB — VITAMIN D 25 HYDROXY (VIT D DEFICIENCY, FRACTURES): Vit D, 25-Hydroxy: 6.9 ng/mL — ABNORMAL LOW (ref 30.0–100.0)

## 2024-05-19 LAB — TSH: TSH: 1.89 u[IU]/mL (ref 0.450–4.500)

## 2024-05-19 LAB — HEMOGLOBIN A1C
Est. average glucose Bld gHb Est-mCnc: 237 mg/dL
Hgb A1c MFr Bld: 9.9 % — ABNORMAL HIGH (ref 4.8–5.6)

## 2024-05-19 MED ORDER — VITAMIN D (ERGOCALCIFEROL) 1.25 MG (50000 UNIT) PO CAPS
50000.0000 [IU] | ORAL_CAPSULE | ORAL | 2 refills | Status: AC
  Filled 2024-05-19: qty 4, 28d supply, fill #0

## 2024-05-20 ENCOUNTER — Other Ambulatory Visit: Payer: Self-pay

## 2024-06-08 ENCOUNTER — Ambulatory Visit (INDEPENDENT_AMBULATORY_CARE_PROVIDER_SITE_OTHER): Payer: Self-pay | Admitting: Primary Care
